# Patient Record
Sex: Male | Born: 1950 | Race: White | Hispanic: No | Marital: Married | Smoking: Former smoker
Health system: Southern US, Community
[De-identification: ages and names within clinical notes are randomized; demographics above are authoritative.]

## PROBLEM LIST (undated history)

## (undated) DIAGNOSIS — E7800 Pure hypercholesterolemia, unspecified: Secondary | ICD-10-CM

## (undated) DIAGNOSIS — J349 Unspecified disorder of nose and nasal sinuses: Secondary | ICD-10-CM

## (undated) DIAGNOSIS — I10 Essential (primary) hypertension: Secondary | ICD-10-CM

## (undated) DIAGNOSIS — L570 Actinic keratosis: Secondary | ICD-10-CM

## (undated) HISTORY — PX: SKIN CANCER EXCISION: SHX779

## (undated) HISTORY — DX: Essential (primary) hypertension: I10

## (undated) HISTORY — DX: Unspecified disorder of nose and nasal sinuses: J34.9

## (undated) HISTORY — PX: KNEE ARTHROSCOPY: SUR90

## (undated) HISTORY — DX: Pure hypercholesterolemia, unspecified: E78.00

## (undated) HISTORY — PX: OTHER SURGICAL HISTORY: SHX169

## (undated) HISTORY — DX: Actinic keratosis: L57.0

## (undated) HISTORY — PX: JOINT REPLACEMENT: SHX530

## (undated) HISTORY — PX: CHOLECYSTECTOMY: SHX55

## (undated) HISTORY — PX: BACK SURGERY: SHX140

---

## 1998-06-26 HISTORY — PX: LUMBAR FUSION: SHX111

## 2005-06-26 HISTORY — PX: OTHER SURGICAL HISTORY: SHX169

## 2005-06-26 HISTORY — PX: REPLACEMENT TOTAL KNEE: SUR1224

## 2006-01-17 ENCOUNTER — Other Ambulatory Visit: Payer: Self-pay

## 2006-01-17 IMAGING — CR DG CHEST 2V
1 series · 2 of 2 positions shown · non-contrast
Comparison: none

REASON FOR EXAM: Hypertension
COMMENTS:

PROCEDURE:     DXR - DXR CHEST PA (OR AP) AND LATERAL  - [DATE] [DATE]
RESULT:     The lung fields are clear. The heart, mediastinal and osseous
structures are normal in appearance.

[Series 1: view not recorded · 0.17mm/px · 2 of 2 slices shown]
[im 1/2]
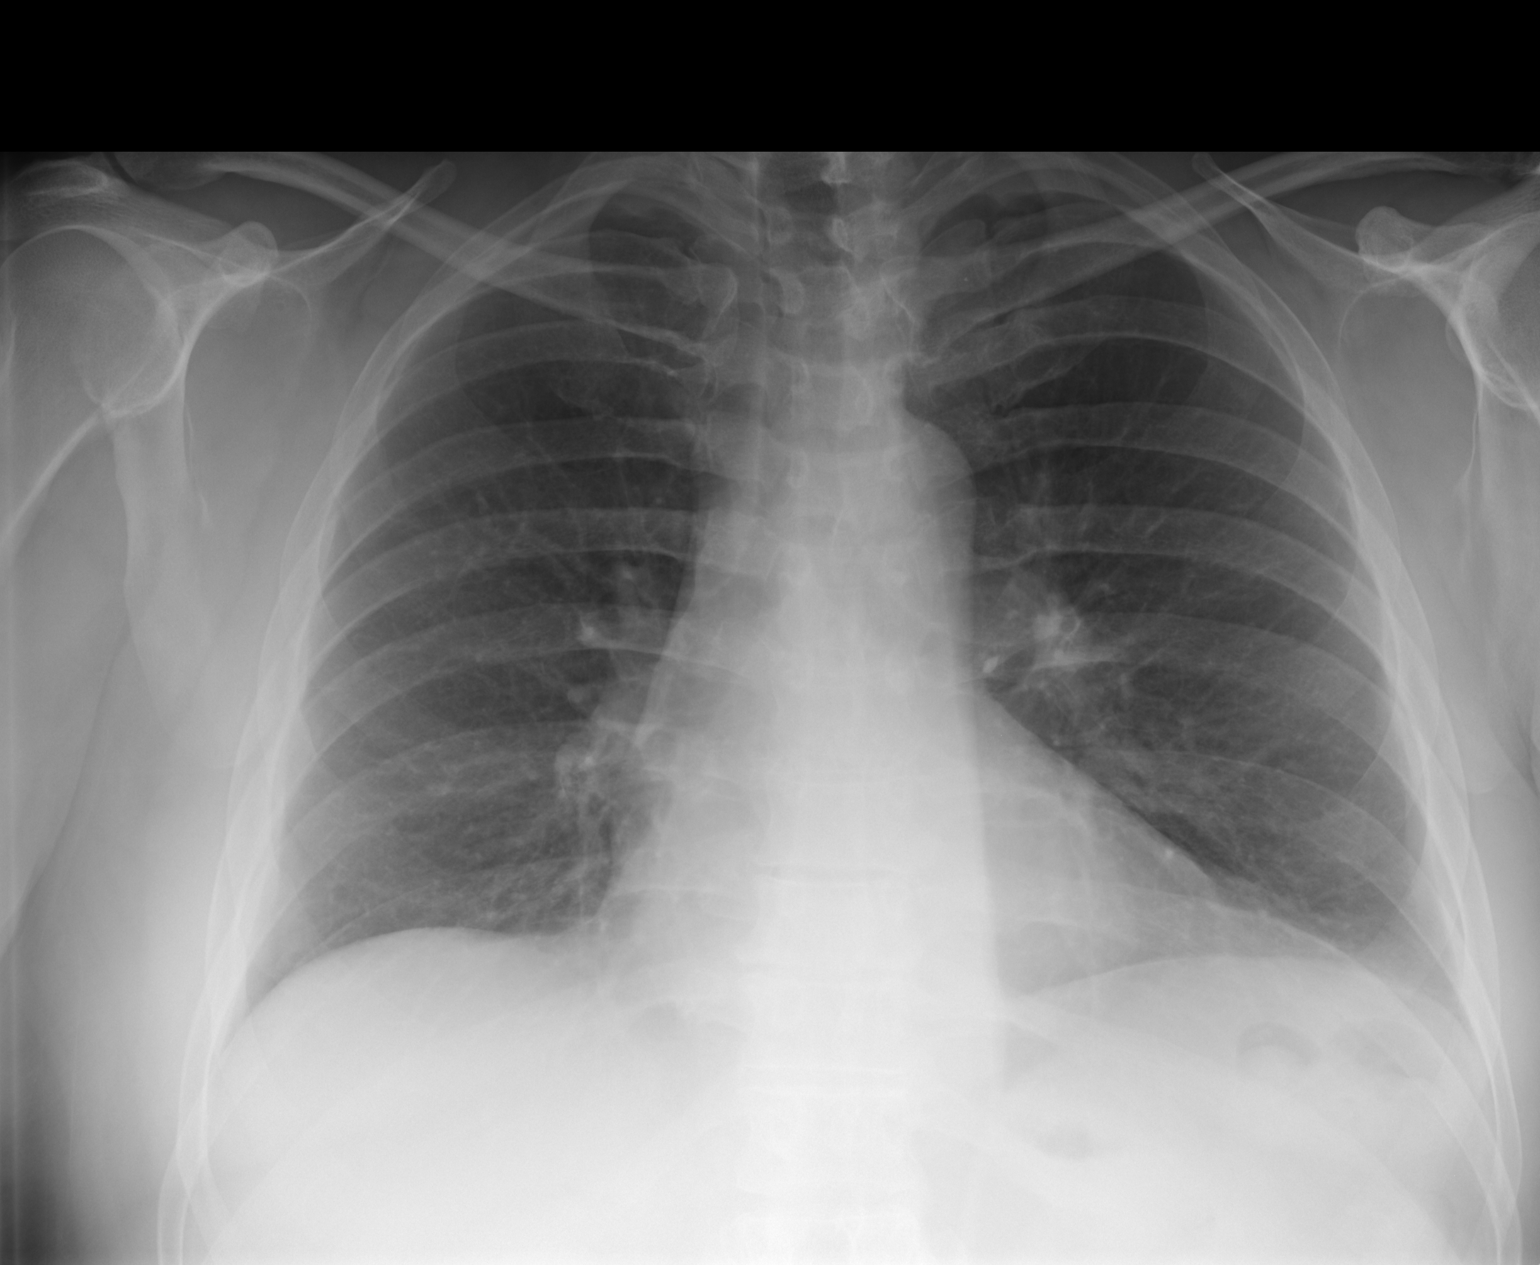
[im 2/2]
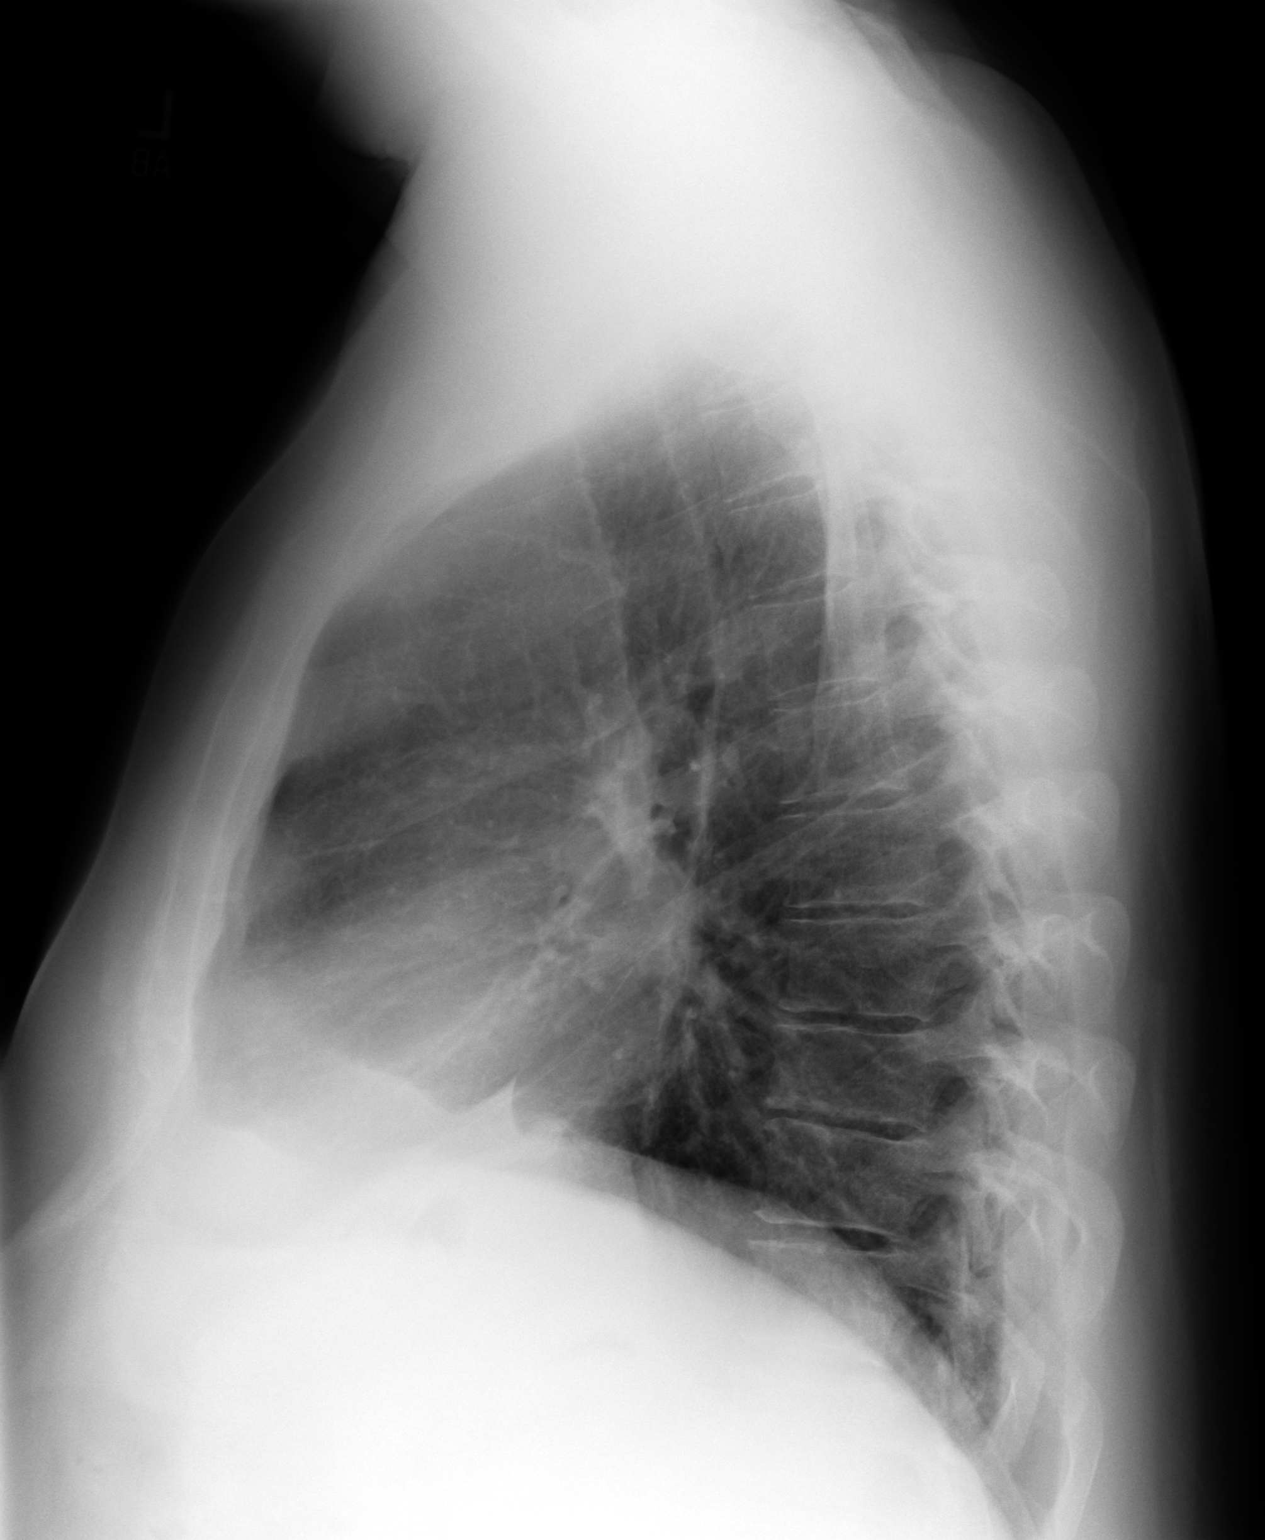

[2 of 2 positions shown; findings below may reference images not displayed]

IMPRESSION: No acute changes are identified.

## 2006-01-19 ENCOUNTER — Ambulatory Visit: Payer: Self-pay | Admitting: Unknown Physician Specialty

## 2006-04-04 ENCOUNTER — Ambulatory Visit: Payer: Self-pay | Admitting: Unknown Physician Specialty

## 2006-09-25 ENCOUNTER — Other Ambulatory Visit: Payer: Self-pay

## 2006-09-25 ENCOUNTER — Ambulatory Visit: Payer: Self-pay | Admitting: Unknown Physician Specialty

## 2006-09-25 IMAGING — CR DG CHEST 2V
1 series · 2 of 2 positions shown · non-contrast
Comparison: none

REASON FOR EXAM: htn
COMMENTS:

[Series 1: view not recorded · 0.17mm/px · 2 of 2 slices shown]
[im 1/2]
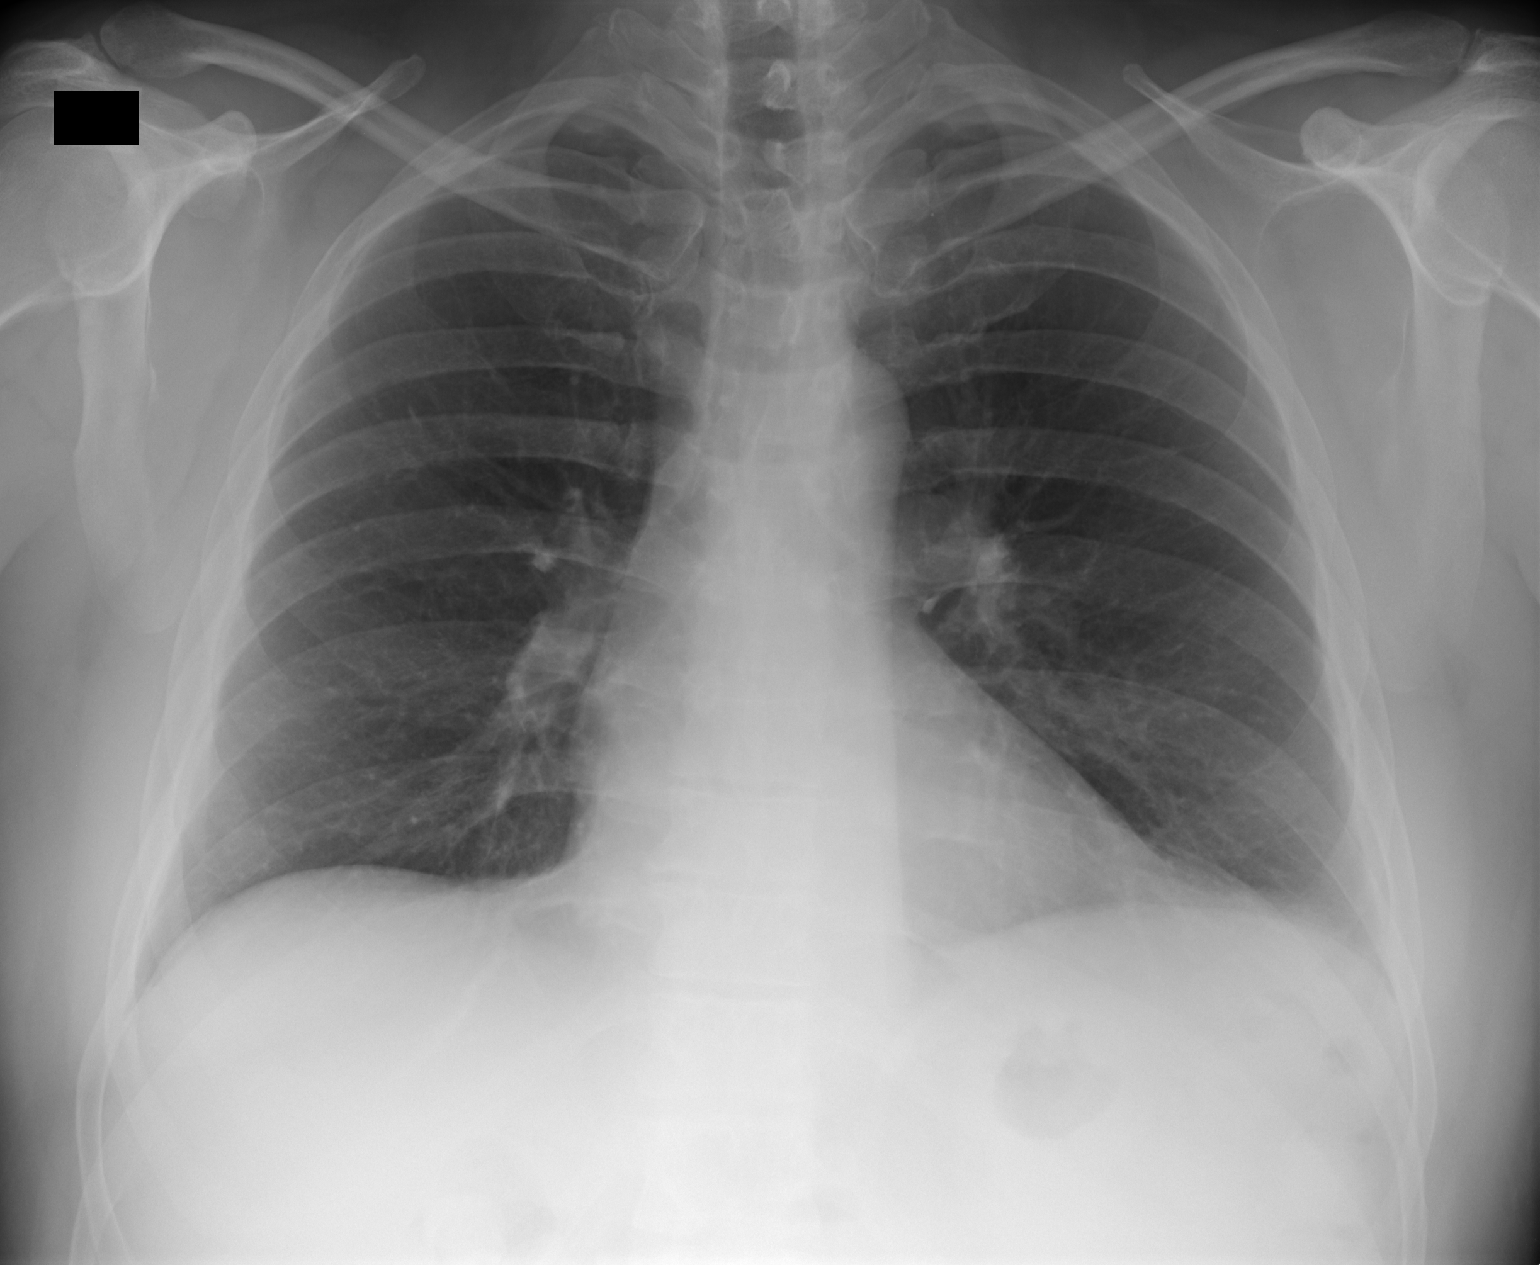
[im 2/2]
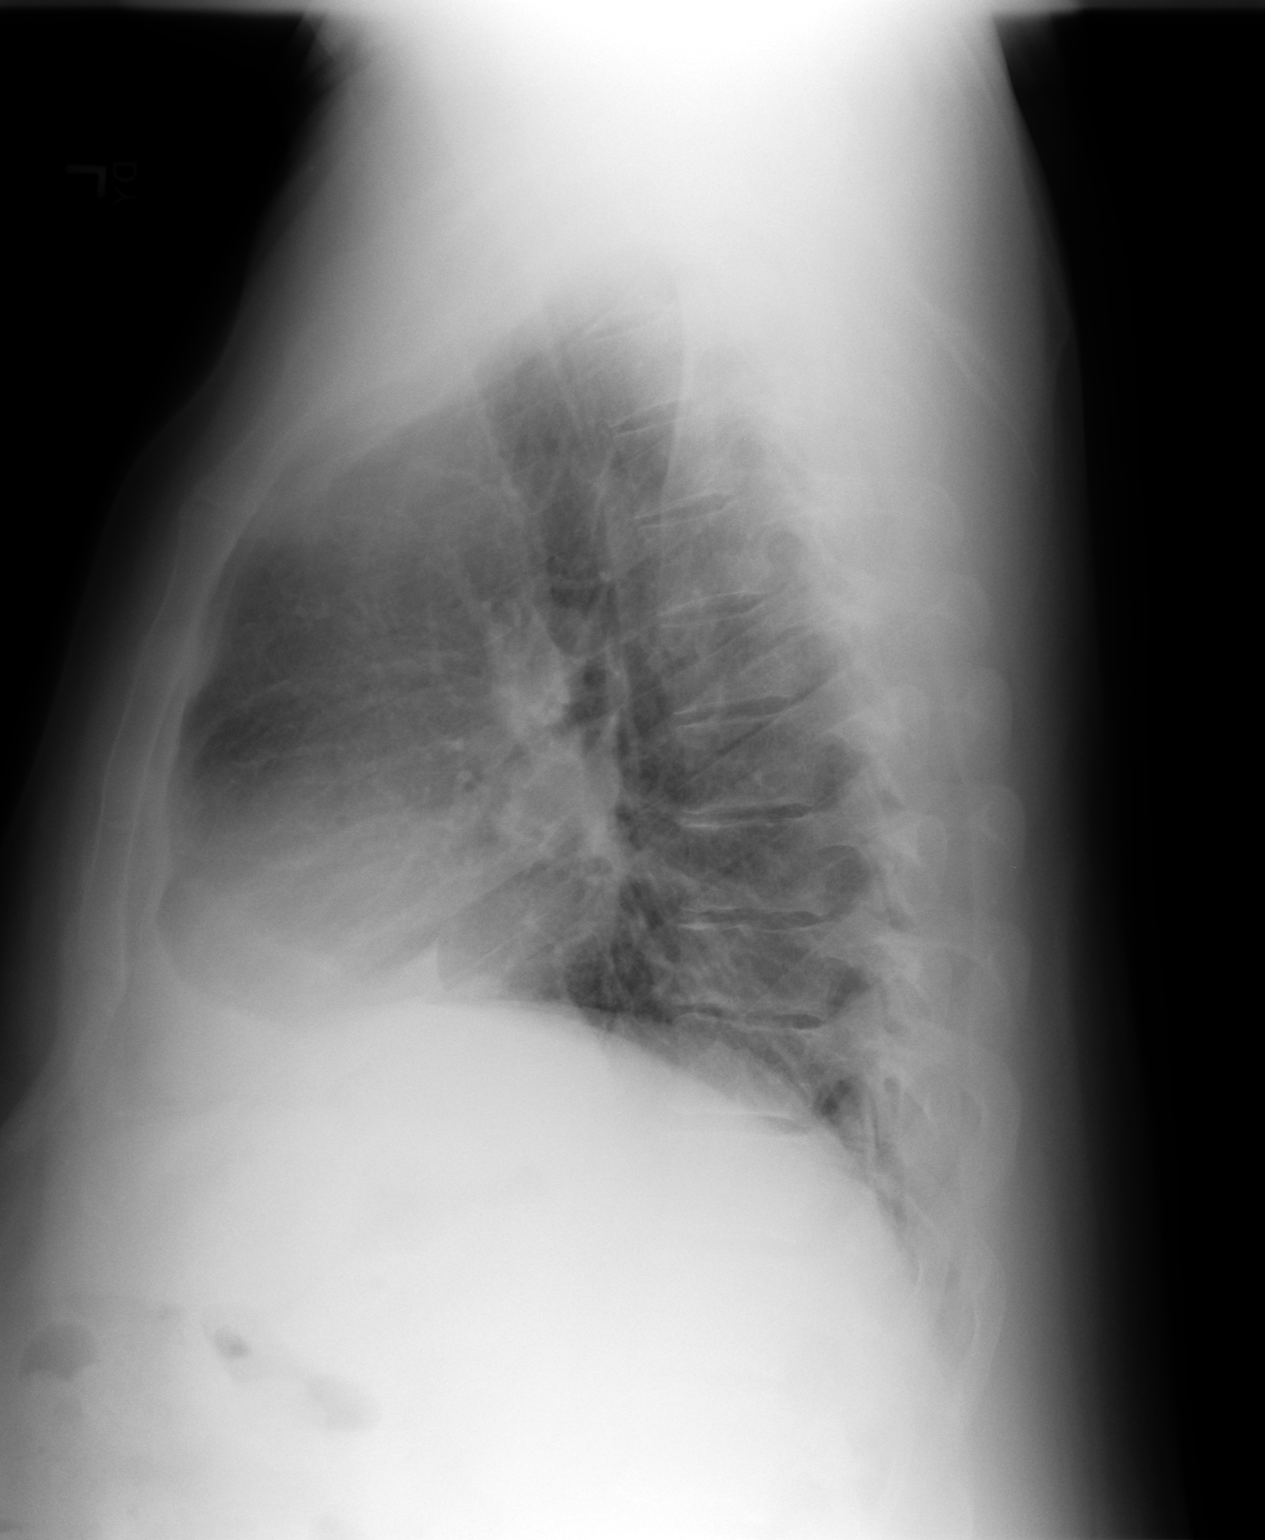

[2 of 2 positions shown; findings below may reference images not displayed]

PROCEDURE:     DXR - DXR CHEST PA (OR AP) AND LATERAL  - [DATE]  [DATE]

RESULT:     Comparison is made to a [DATE].

The lungs are adequately inflated. Slightly increased density is noted at
the LEFT lung base but no discrete infiltrate is seen. The heart and
pulmonary vascularity are within the limits of normal.
IMPRESSION: I do not see objective evidence of acute cardiopulmonary abnormality.

## 2006-10-02 ENCOUNTER — Inpatient Hospital Stay: Payer: Self-pay | Admitting: Unknown Physician Specialty

## 2006-10-02 IMAGING — CR DG KNEE 1-2V*R*
1 series · 3 of 3 positions shown · non-contrast
Comparison: none

REASON FOR EXAM: post op TKR
COMMENTS:   Bedside (portable):Y

[Series 1: view not recorded · 0.17mm/px · 3 of 3 slices shown]
[im 1/3]
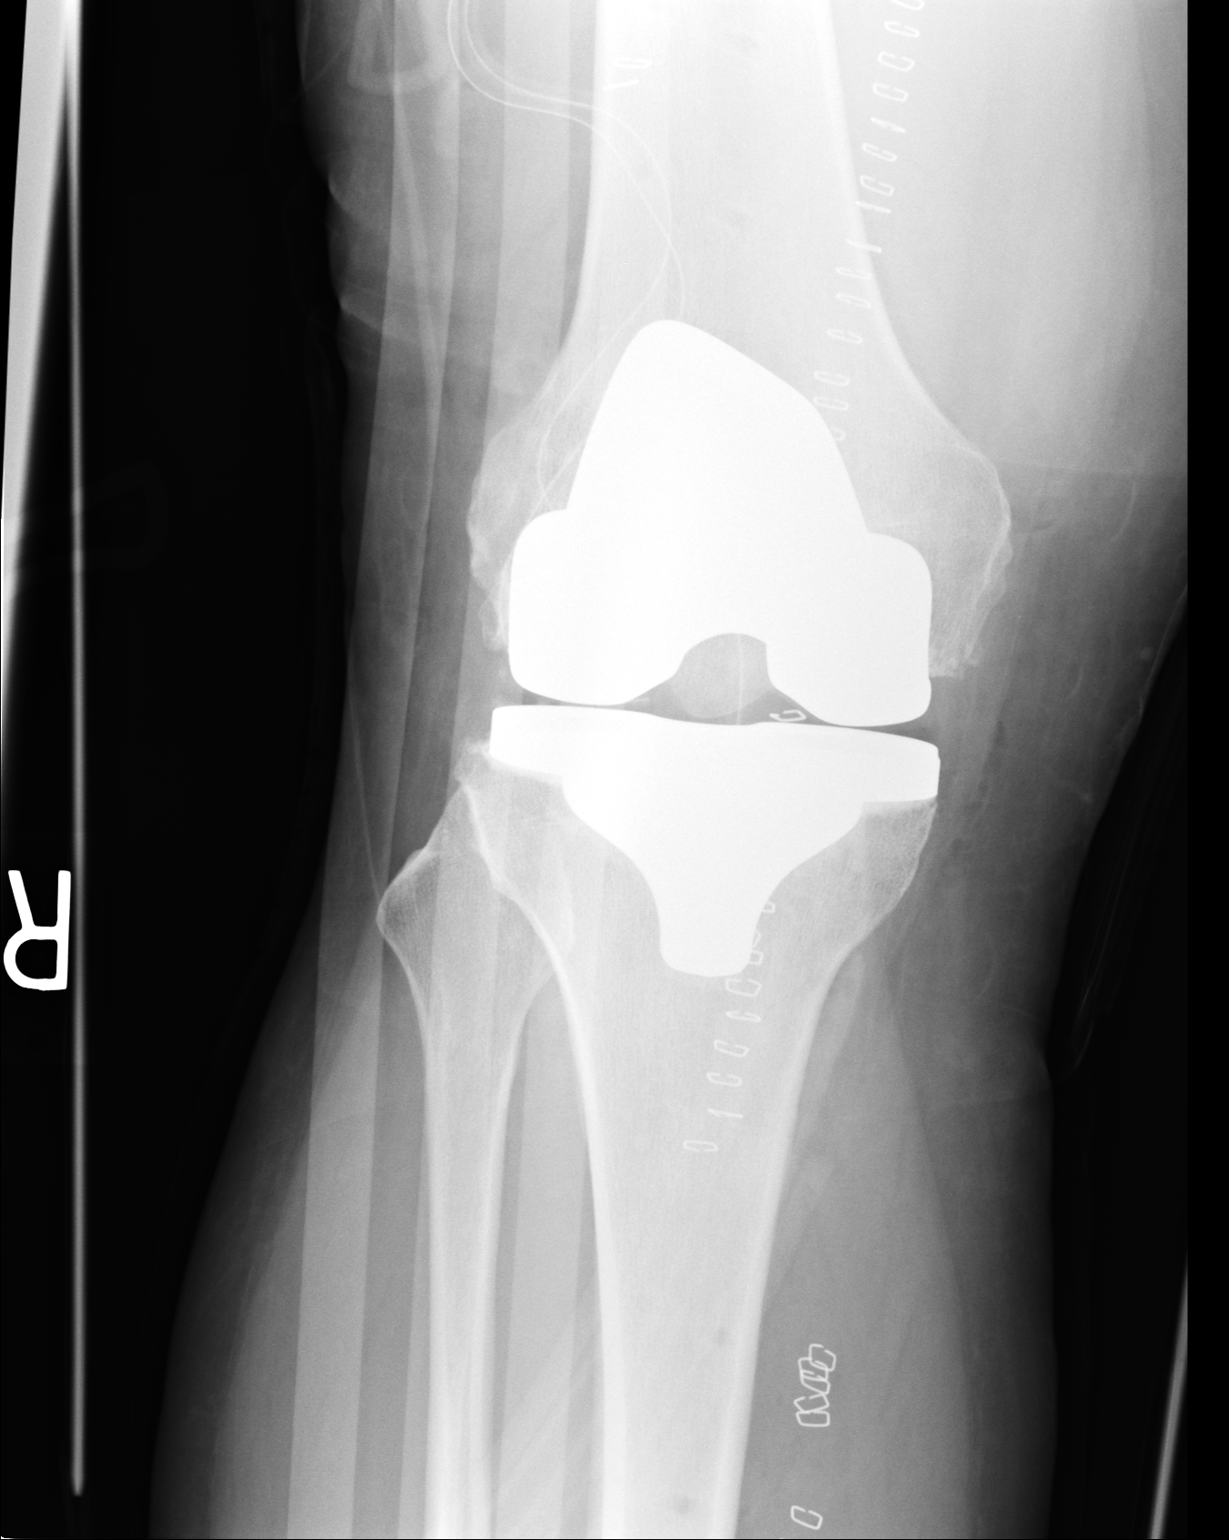
[im 2/3]
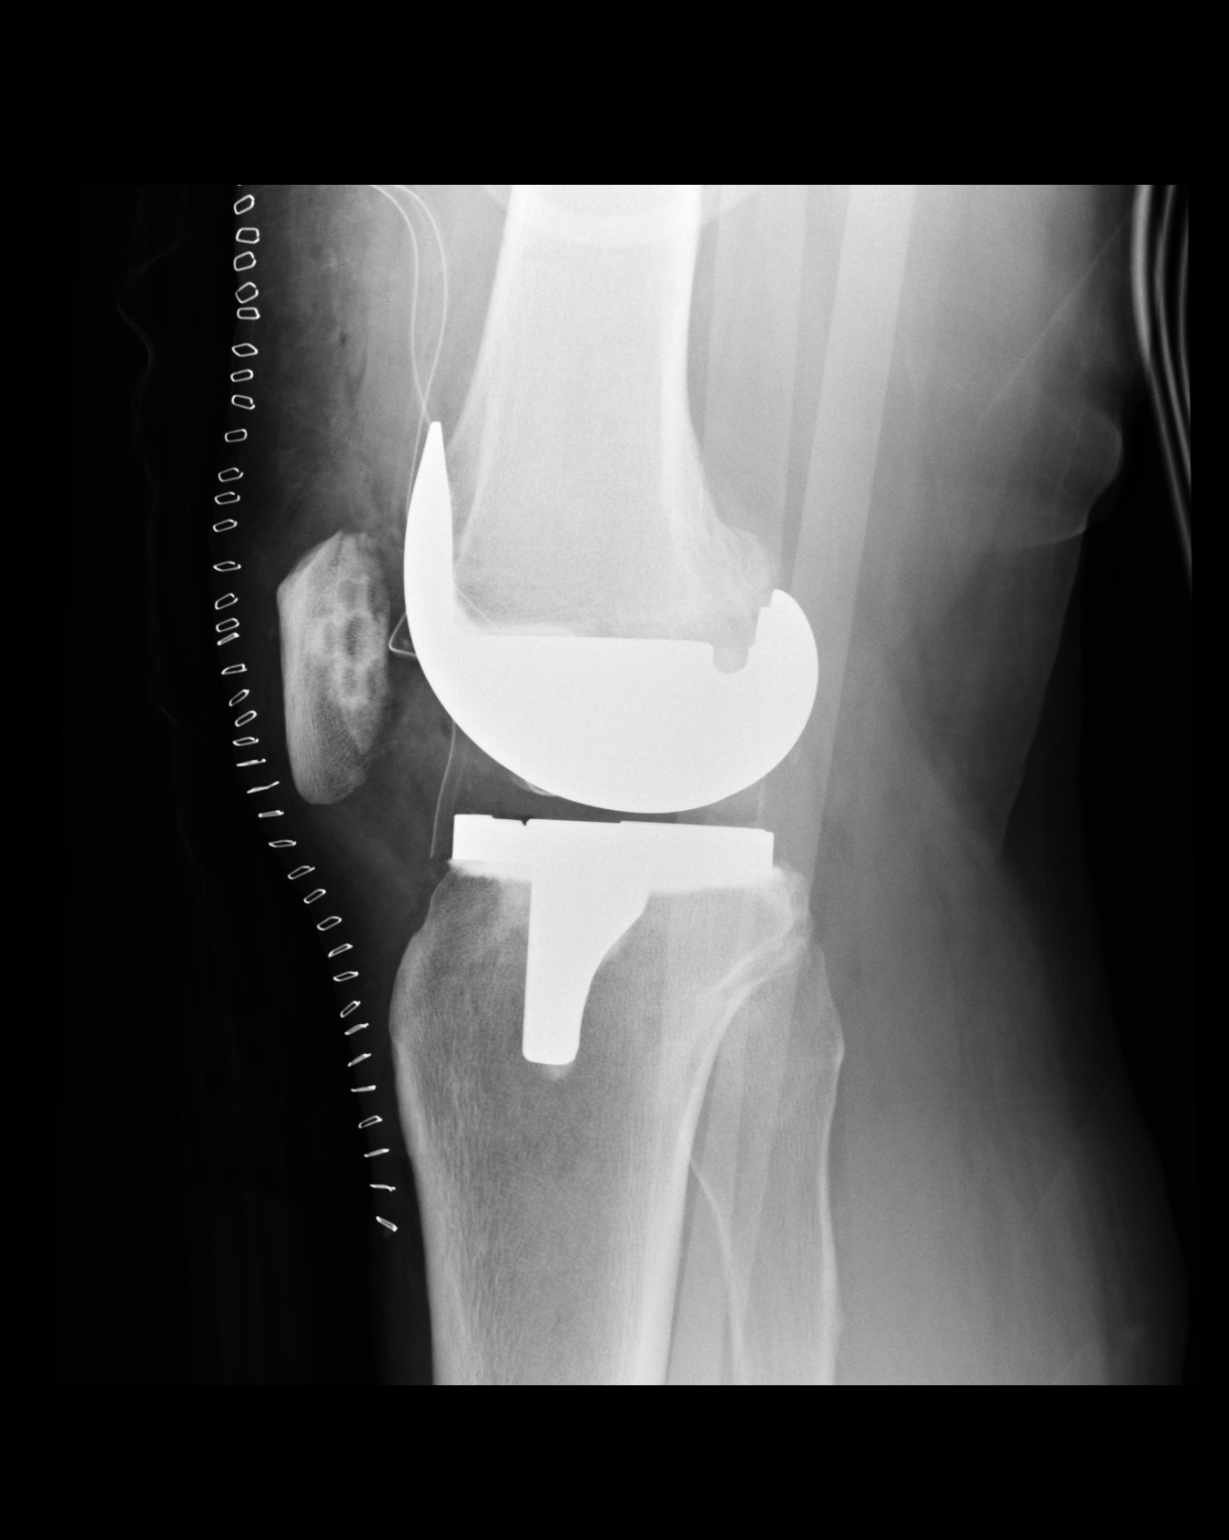
[im 3/3]
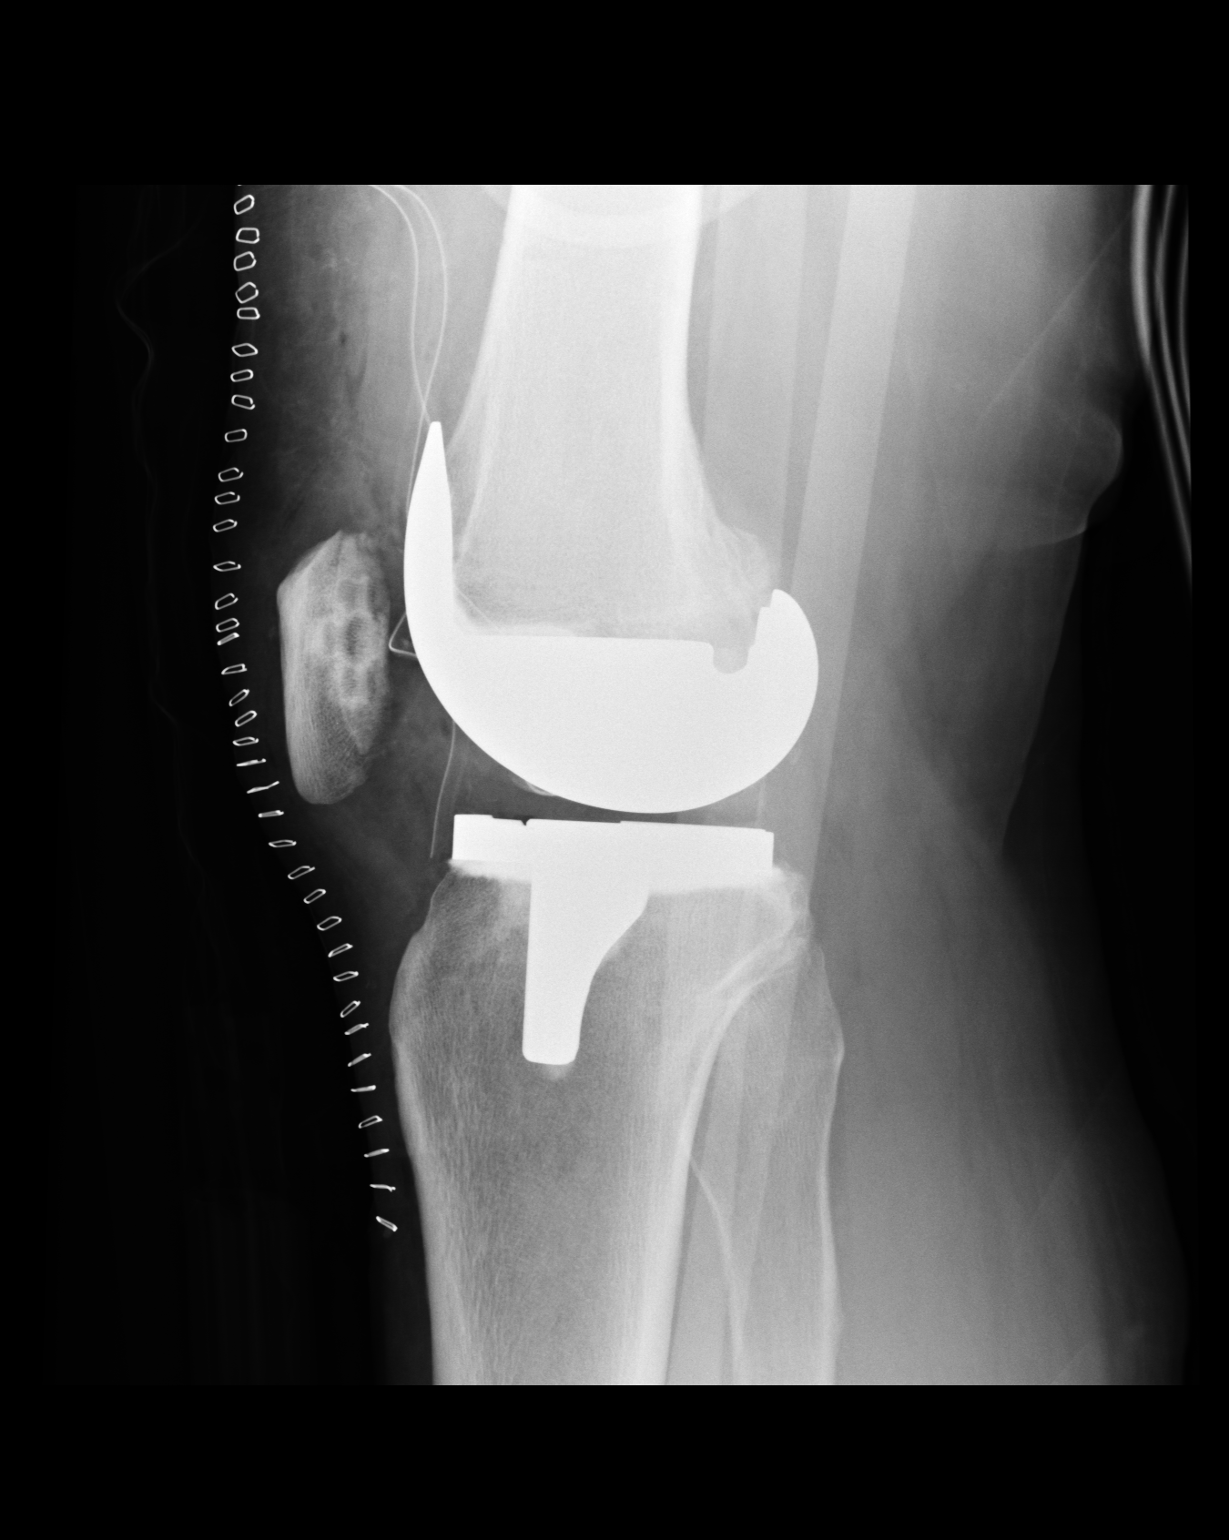

[3 of 3 positions shown; findings below may reference images not displayed]

PROCEDURE:     DXR - DXR KNEE RIGHT AP AND LATERAL  - [DATE] [DATE]

RESULT:     The patient is status post total RIGHT knee replacement.  There
is a possible cortical fracture along the distal femur anteriorly superior
to the anterior margin of the femoral prosthesis component. Note is made
this is not a definite finding but a possible finding. No other areas
suspicious for fracture about the prosthetic components are seen. There is
no dislocation of the prosthetic knee joint. Surgical drains are noted
anteriorly.
IMPRESSION: 1.     Please see above.

## 2006-10-08 ENCOUNTER — Ambulatory Visit: Payer: Self-pay | Admitting: Pain Medicine

## 2007-04-19 ENCOUNTER — Ambulatory Visit: Payer: Self-pay | Admitting: Gastroenterology

## 2009-06-30 ENCOUNTER — Ambulatory Visit: Payer: Self-pay | Admitting: General Surgery

## 2009-06-30 IMAGING — CT CT ABD-PELV W/ CM
1 of 3 series · 14 of 32 positions shown, 19 images · IV contrast (isovue)
Comparison: None

REASON FOR EXAM: (1) ruq pain; (2) ruq pain
COMMENTS:   May transport without cardiac monitor

PROCEDURE:     CT  - CT ABDOMEN / PELVIS  W  - [DATE] [DATE]
RESULT:     History: Right upper quadrant pain
TECHNIQUE: Multiple axial images of the abdomen and pelvis were performed
from the lung bases to the pubic symphysis, with p.o. contrast and with 100
ml of Isovue 370 intravenous contrast.

[Series 2: abdomen · axial · 0.86mm/px · z∈[-60,+390]mm · 14 of 102 slices shown, 19 images]
[im 6/102  soft-tissue]
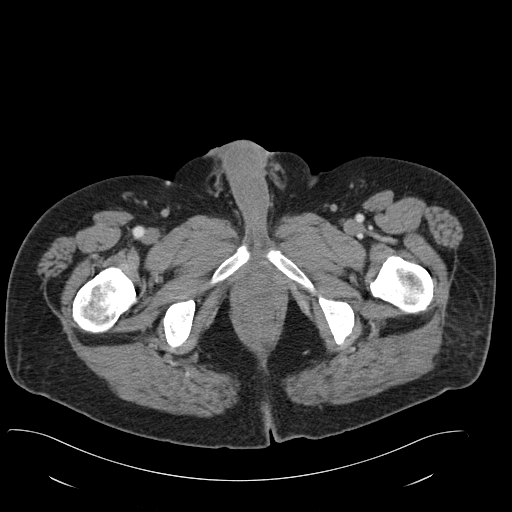
[im 6/102  bone]
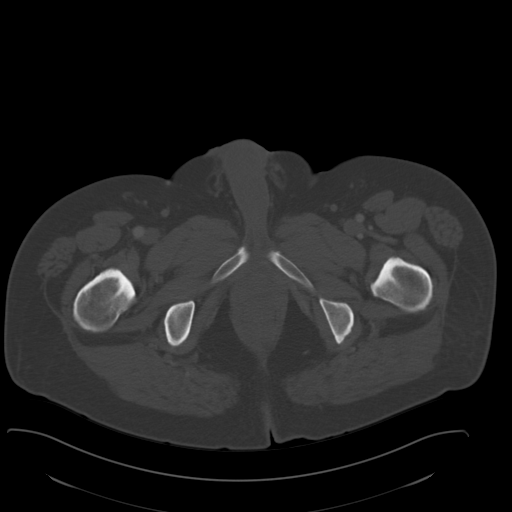
[im 12/102  soft-tissue]
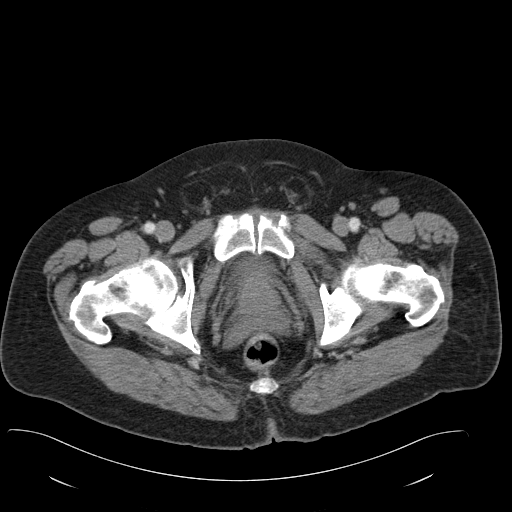
[im 24/102  soft-tissue]
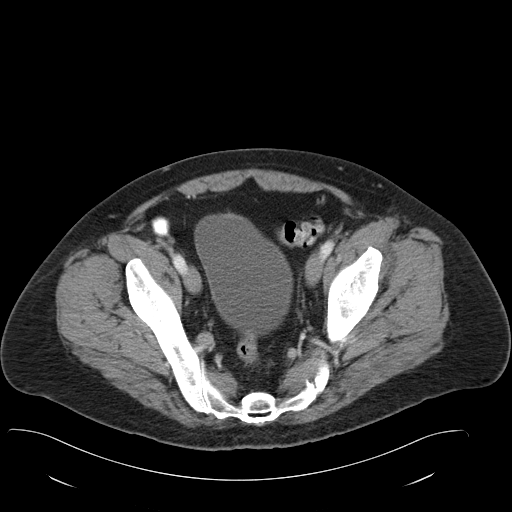
[im 30/102  soft-tissue]
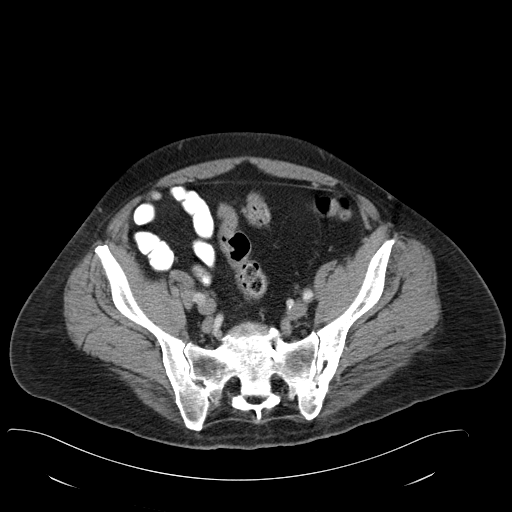
[im 36/102  soft-tissue]
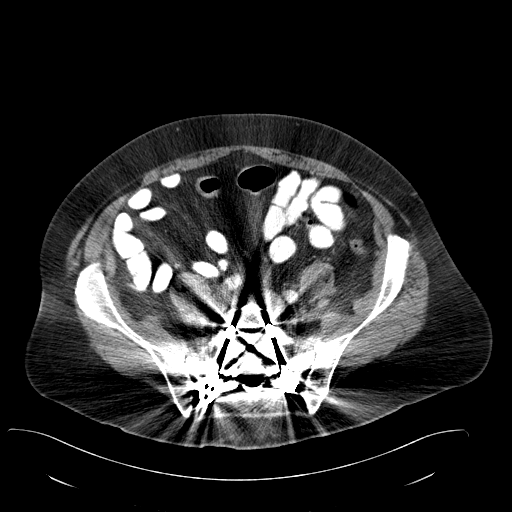
[im 42/102  soft-tissue]
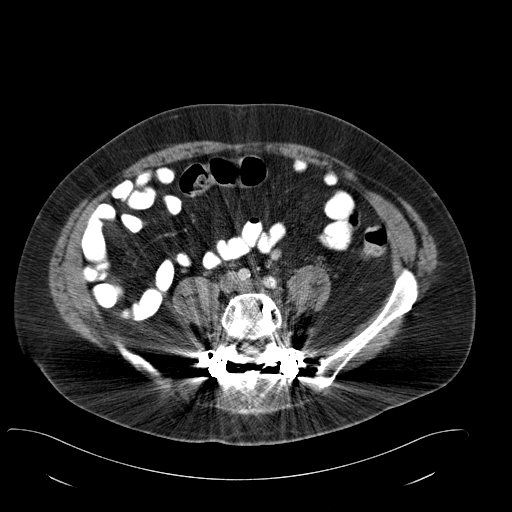
[im 54/102  soft-tissue]
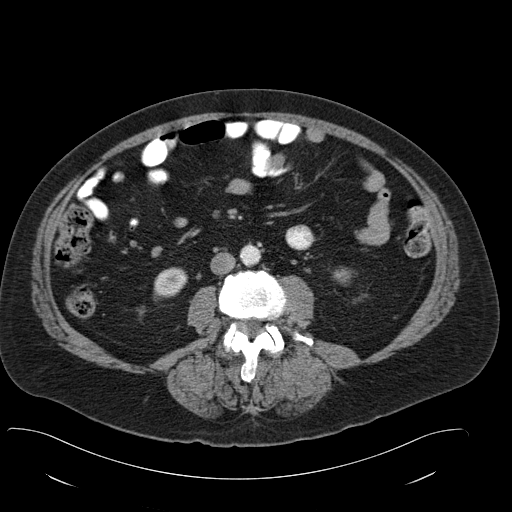
[im 60/102  soft-tissue]
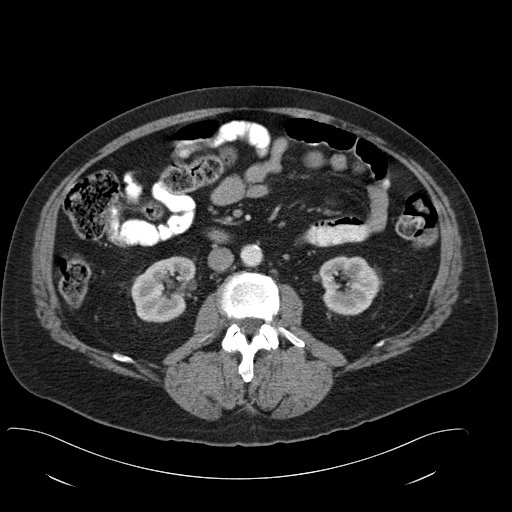
[im 66/102  soft-tissue]
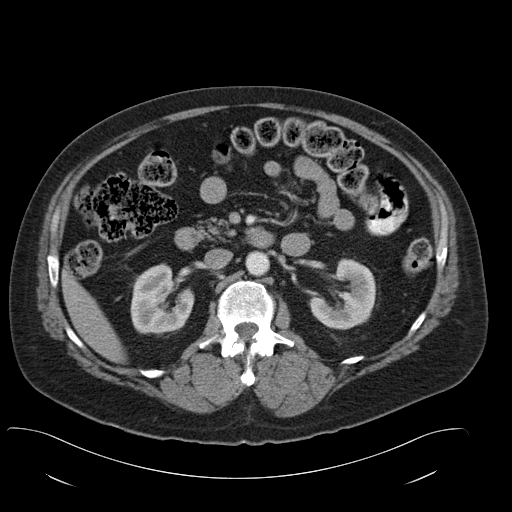
[im 66/102  bone]
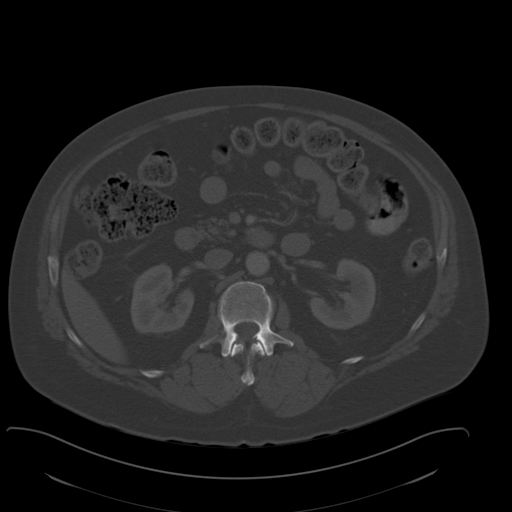
[im 72/102  soft-tissue]
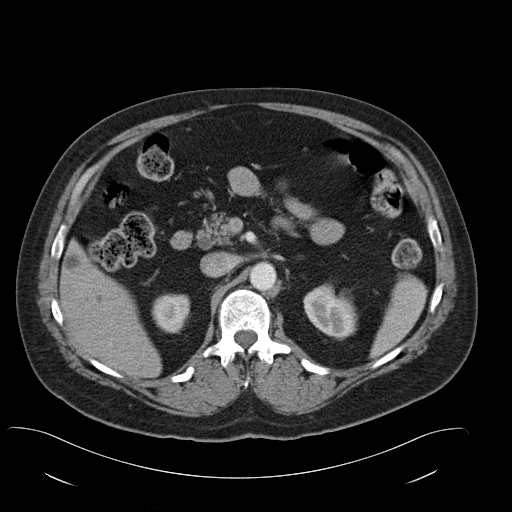
[im 78/102  soft-tissue]
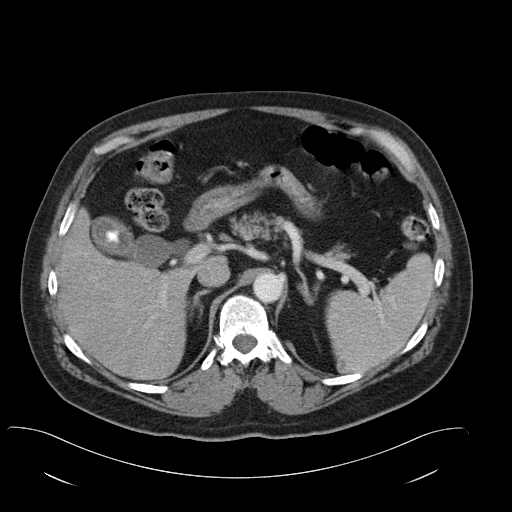
[im 78/102  lung]
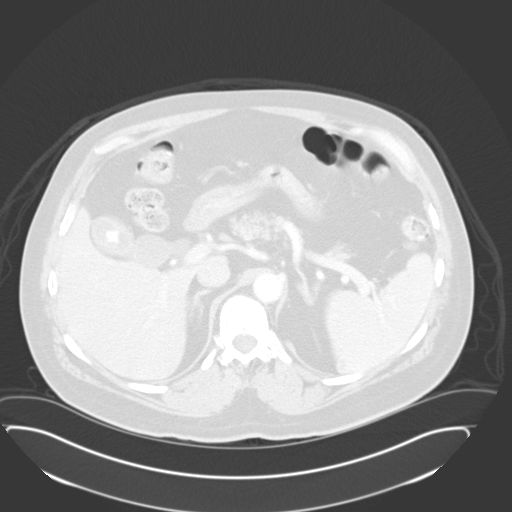
[im 84/102  lung]
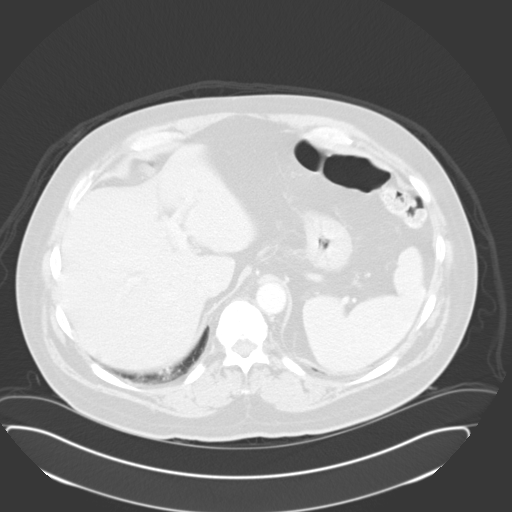
[im 90/102  soft-tissue]
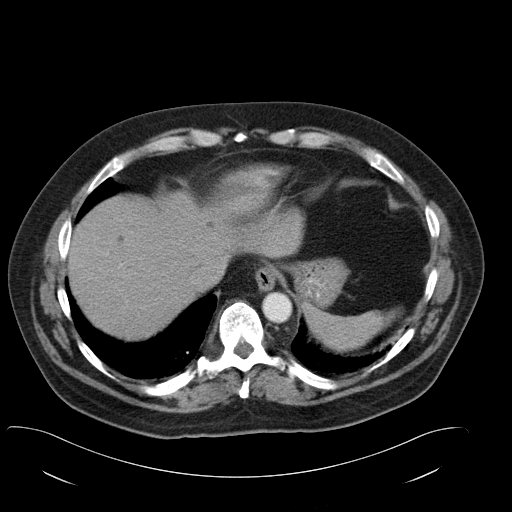
[im 90/102  lung]
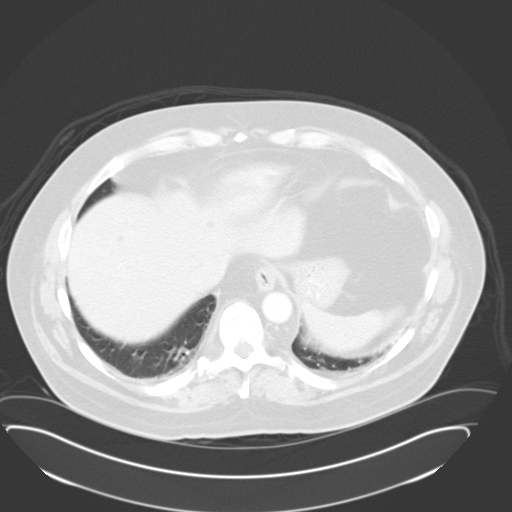
[im 96/102  soft-tissue]
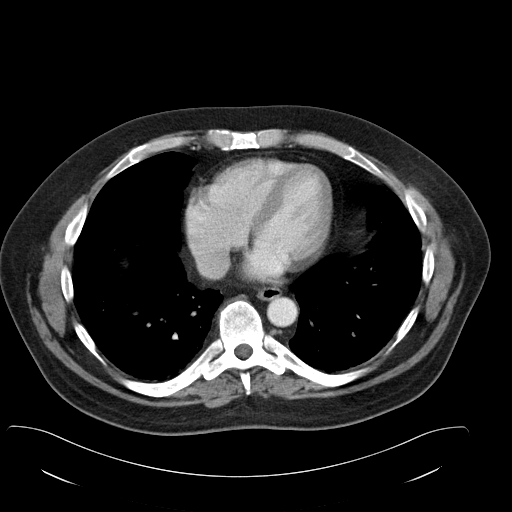
[im 96/102  lung]
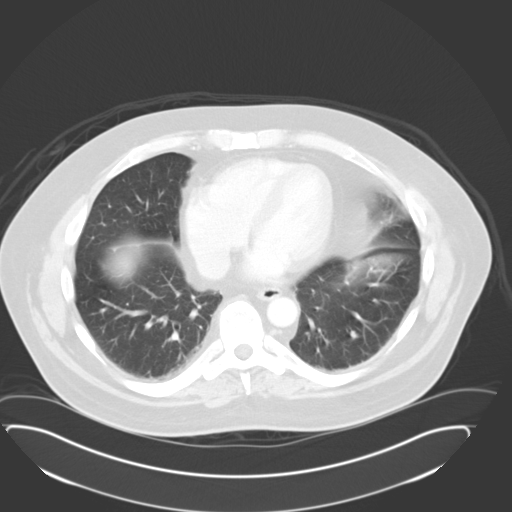

[14 of 32 positions shown; findings below may reference images not displayed]

FINDINGS: The lung bases are clear. There is no pneumothorax. The heart size is
normal.

The liver demonstrates no focal abnormality. There is mild intrahepatic
biliary ductal dilatation. There are multiple cystic areas within the left
hepatic lobe which may represent choledochal cyst versus hamartomas versus
cysts. There are cholelithiasis. The spleen demonstrates no focal
abnormality. The kidneys, adrenal glands, and pancreas are normal. The
bladder is unremarkable.

The stomach, duodenum, small intestine, and large intestine demonstrate no
contrast extravasation or dilatation. There is a normal caliber appendix in
the right lower quadrant without periappendiceal inflammatory changes. There
is no pneumoperitoneum, pneumatosis, or portal venous gas. There is no
abdominal or pelvic free fluid. There is no lymphadenopathy.

The abdominal aorta is normal in caliber with atherosclerosis.

There is posterior spinal fusion from L4 through S1.
IMPRESSION: Cholelithiasis with mild gallbladder wall thickening. Correlate with prior
ultrasound the appearance can be seen with cholecystitis.

Mild intrahepatic biliary ductal dilatation. There are also cystic areas in
the left hepatic lobe which may resent hamartomas, cysts versus choledochal
cyst.

## 2009-06-30 IMAGING — US ABDOMEN ULTRASOUND
1 series · 17 of 25 positions shown · non-contrast
Comparison: none

REASON FOR EXAM: Need Gallbadder US for RUQ pain
COMMENTS:

[Series 1: abdomen ultrasound · 17 of 74 slices shown]
[im 1/74]
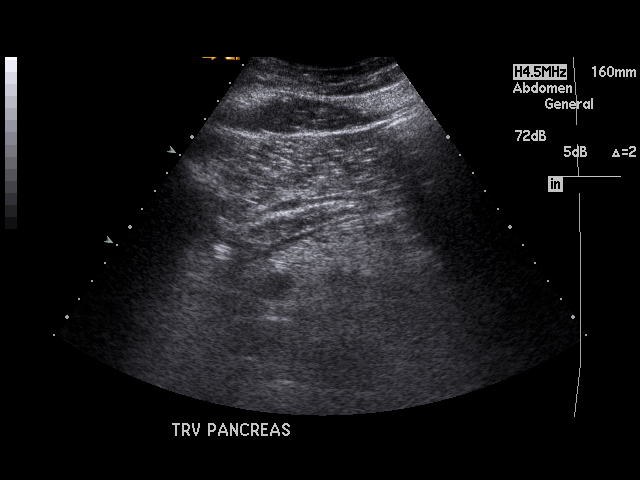
[im 7/74]
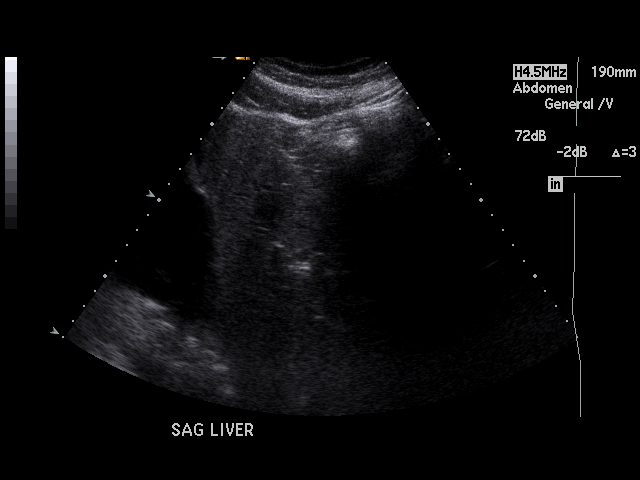
[im 10/74]
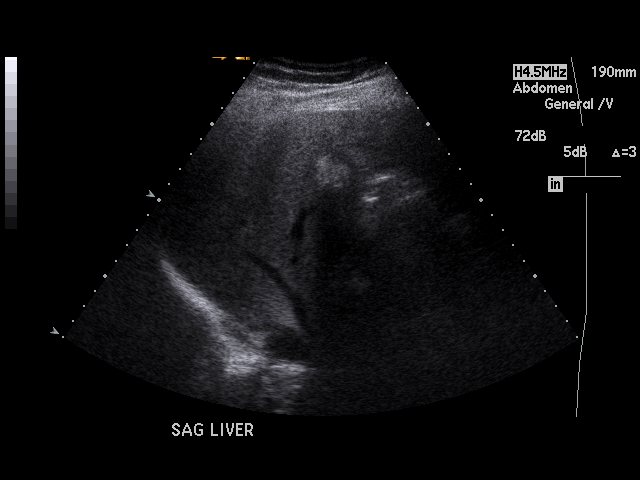
[im 16/74]
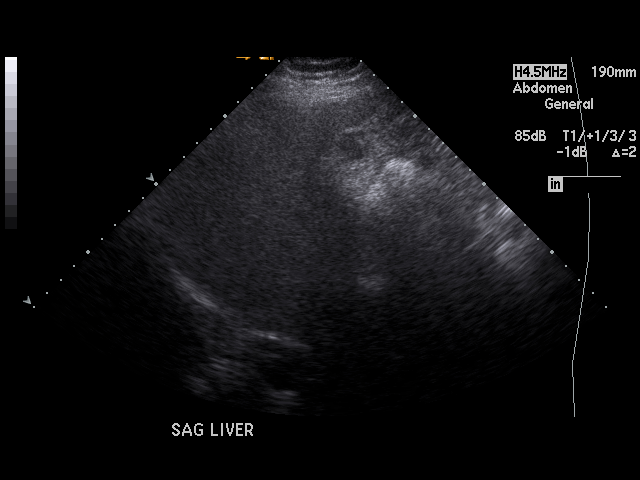
[im 19/74]
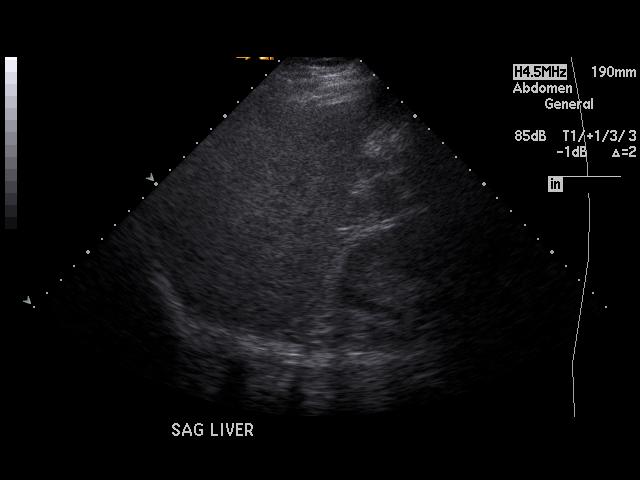
[im 25/74]
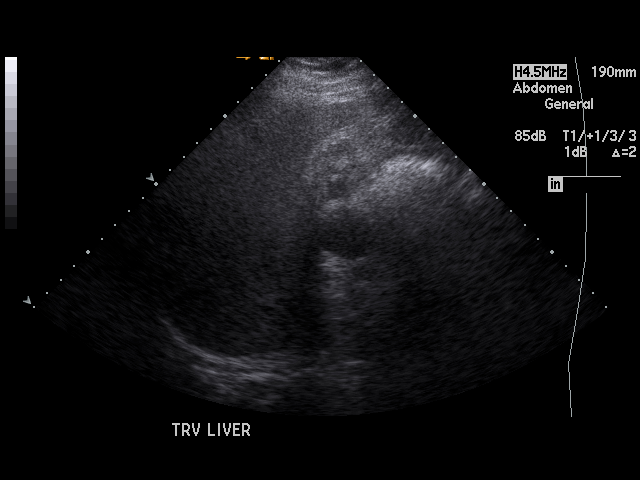
[im 28/74]
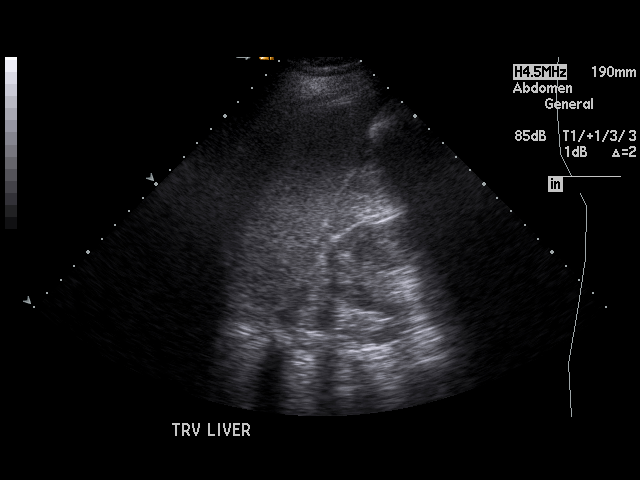
[im 34/74]
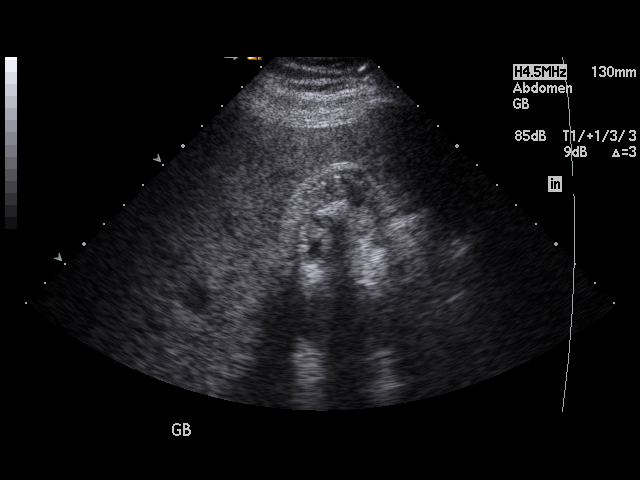
[im 37/74]
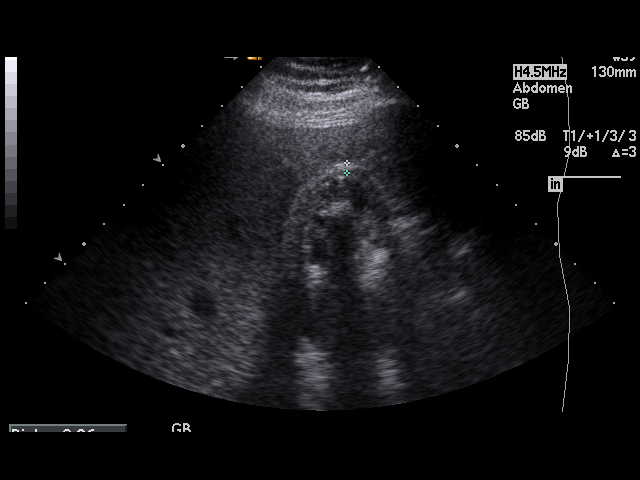
[im 40/74]
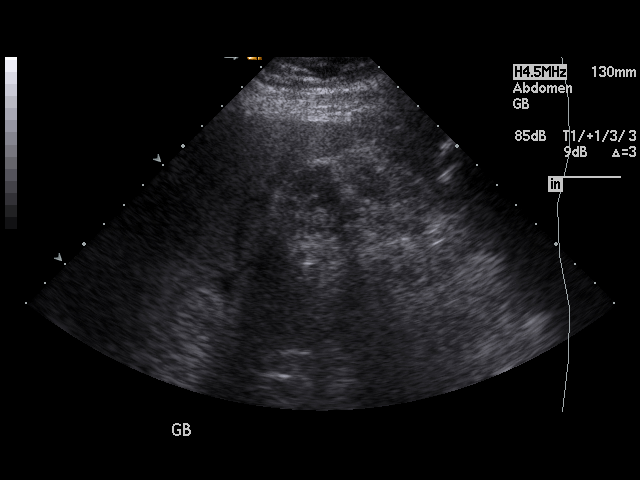
[im 46/74]
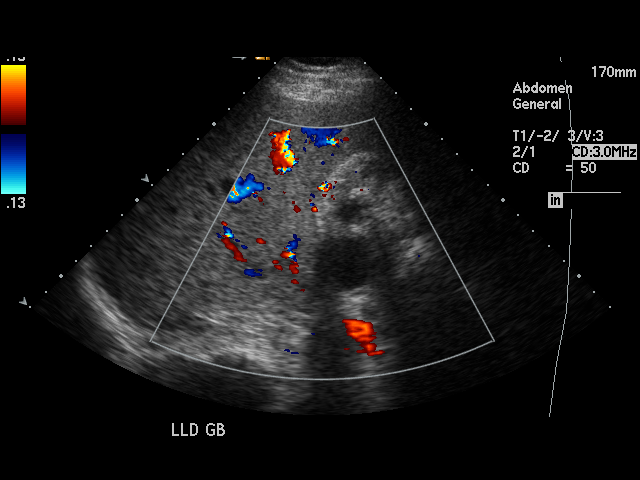
[im 49/74]
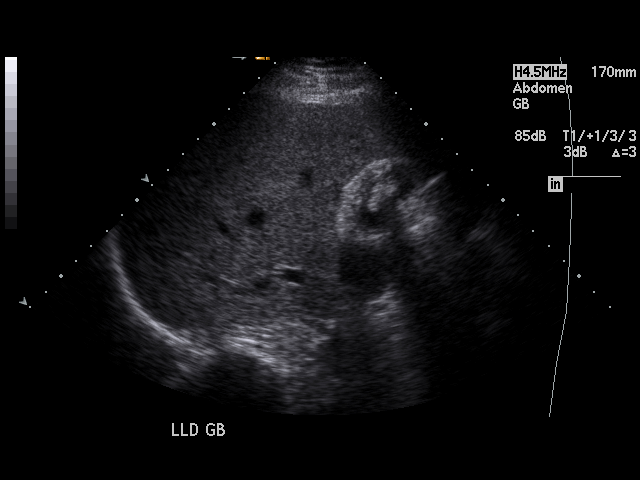
[im 55/74]
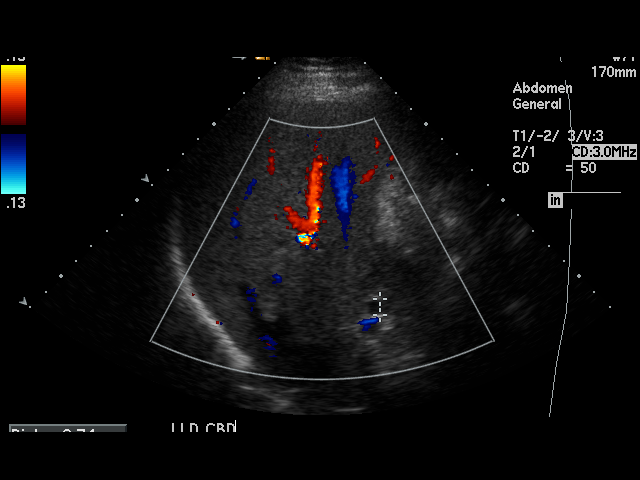
[im 58/74]
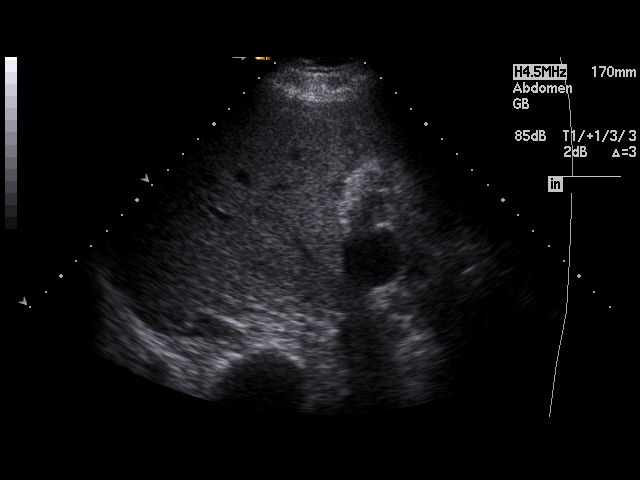
[im 64/74]
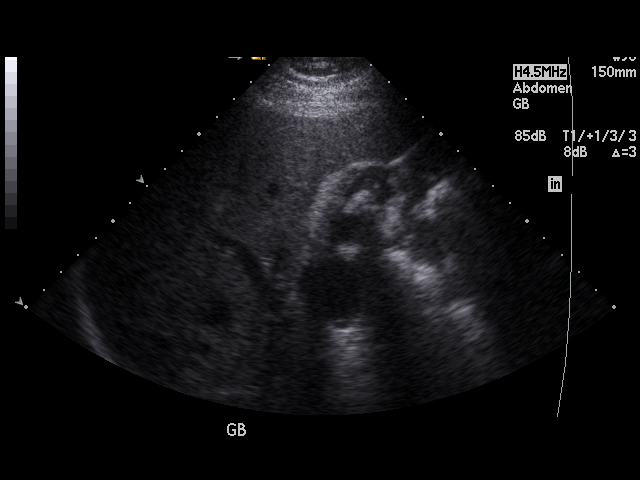
[im 67/74]
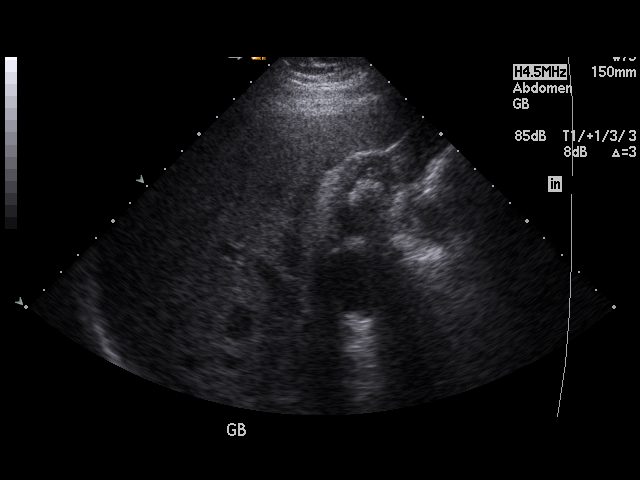
[im 74/74]
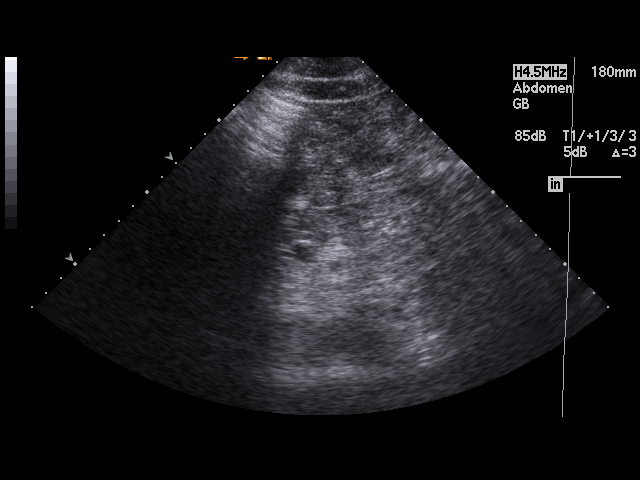

[17 of 25 positions shown; findings below may reference images not displayed]

PROCEDURE:     US  - US ABDOMEN GENERAL SURVEY  - [DATE]  [DATE]

RESULT:     There is a 2.22 cm cyst in the anterior aspect of the right lobe
of the liver. The pancreas is not visualized adequately for evaluation on
this exam. The spleen is mildly enlarged and is noted to measure 13.85 cm at
maximum AP diameter. The abdominal aorta and inferior vena cava show no
significant abnormalities. There are noted multiple echodensities in the
gallbladder compatible with gallstones. There is thickening of the
gallbladder wall which measures 6.2 mm in thickness. No definite
pericholecystic fluid is seen. There is noted a lucent area posterior to the
gallbladder. On the basis of this exam it is uncertain as to whether this
represents a septated segment of the gallbladder, a gallbladder cyst or a
hepatic cyst adjacent to the gallbladder. This could be further evaluated by
CT if such is clinically indicated. The kidneys show no hydronephrosis.
There is no ascites. The common bile duct measures 7 mm in diameter which is
upper limits for normal. No dilated intrahepatic ducts are seen.
IMPRESSION: 1. Cholelithiasis with associated thickening of the gallbladder wall.
2. There is a 3.03 cystic-appearing area associated with the posterior
aspect of the gallbladder. As noted above it is unclear as to whether this
is secondary to a segmented gallbladder, gallbladder cyst or an adjacent
cyst such as a hepatic cyst. This could be further elucidated by CT if such
is clinically indicated.
3. The common bile duct measures 7 mm in diameter which is upper limits for
normal.
4. The spleen is mildly enlarged.
5. The pancreas is not visualized adequately for evaluation on this exam.

## 2009-12-06 ENCOUNTER — Ambulatory Visit: Payer: Self-pay | Admitting: Unknown Physician Specialty

## 2009-12-06 IMAGING — CT CT LUMBAR SPINE WITHOUT CONTRAST
1 of 6 series · 2 of 14 positions shown, 3 images · non-contrast
Comparison: none

REASON FOR EXAM: BACK PAIN PRIOR LUMBAR SURGERY
COMMENTS:

[Series 607: axial range 6 · axial · 0.37mm/px · z∈[-572,-547]mm · 2 of 91 slices shown, 3 images]
[im 31/91  soft-tissue]
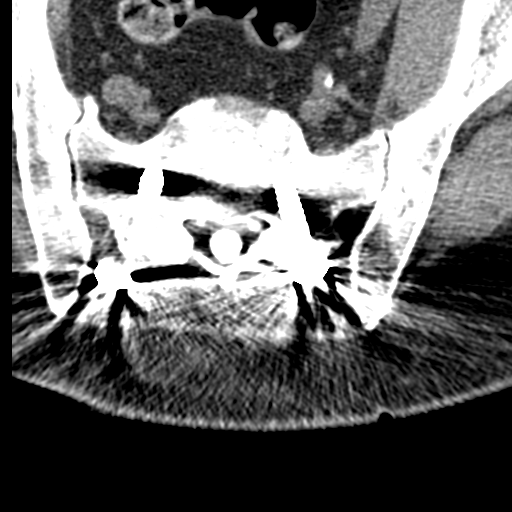
[im 31/91  bone]
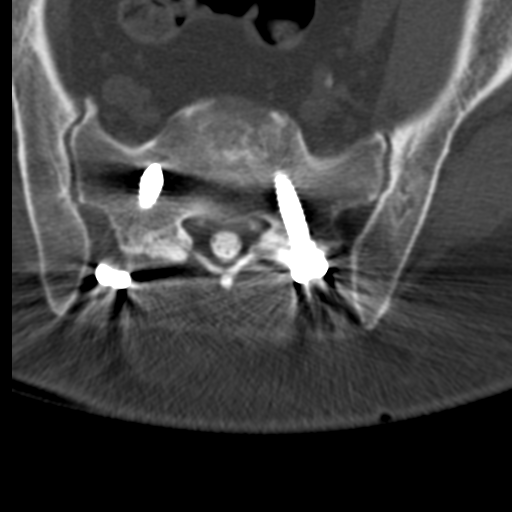
[im 61/91  bone]
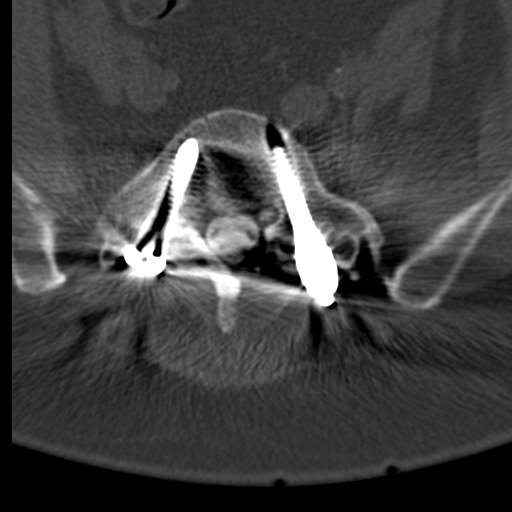

[2 of 14 positions shown; findings below may reference images not displayed]

PROCEDURE:     CT  - CT LUMBAR SPINE WO  - [DATE]  [DATE]

RESULT:

Status post intrathecal injection of [F3], multiple images of the
lumbar spine were obtained in coronal, axial and sagittal planes.

The conus medullaris terminates at a T12 level.

The cauda equina demonstrate no evidence of clumping or thickening.

Postsurgical changes are identified within the lumbar spine from the L3-S1
level. This is evident by rod and screw fixation. There is a significant
amount of associated metallic susceptibility artifact associated with this
area of postsurgical change.

Evaluation of the T11-T12 level demonstrates no evidence of thecal sac
stenosis or neural foraminal narrowing.

At the T12-L1 level there is no evidence of thecal sac stenosis.

At the L1-L2 level there is transverse narrowing of the thecal sac secondary
to facet hypertrophy. This is a mild finding.

At the L2-L3 disc space level there is also evidence of transverse narrowing
of the thecal sac, mild secondary to ligamentum flavum hypertrophy. There is
otherwise no further evidence of thecal sac stenosis.

At the L3-4 and L4-L5 levels there is no evidence of thecal sac stenosis.
Evaluation is degraded by metallic streak artifact.

There is no CT evidence of appreciable neural foraminal narrowing. There is
mild dextroscoliosis of the lower lumbar spine.
IMPRESSION: Areas of mild thecal sac narrowing primarily in the
transverse dimensions as described above. There is no evidence of
significant thecal sac stenosis nor appreciable neural foraminal narrowing.
Evaluation is degraded by metallic susceptibility artifact as described
above.

## 2009-12-06 IMAGING — CR MYELOGRAM LUMBAR
1 series · 1 of 1 positions shown · non-contrast
Comparison: none

REASON FOR EXAM: BACK PAIN PRIOR LUMBAR SURGERY
COMMENTS:

[view not recorded]
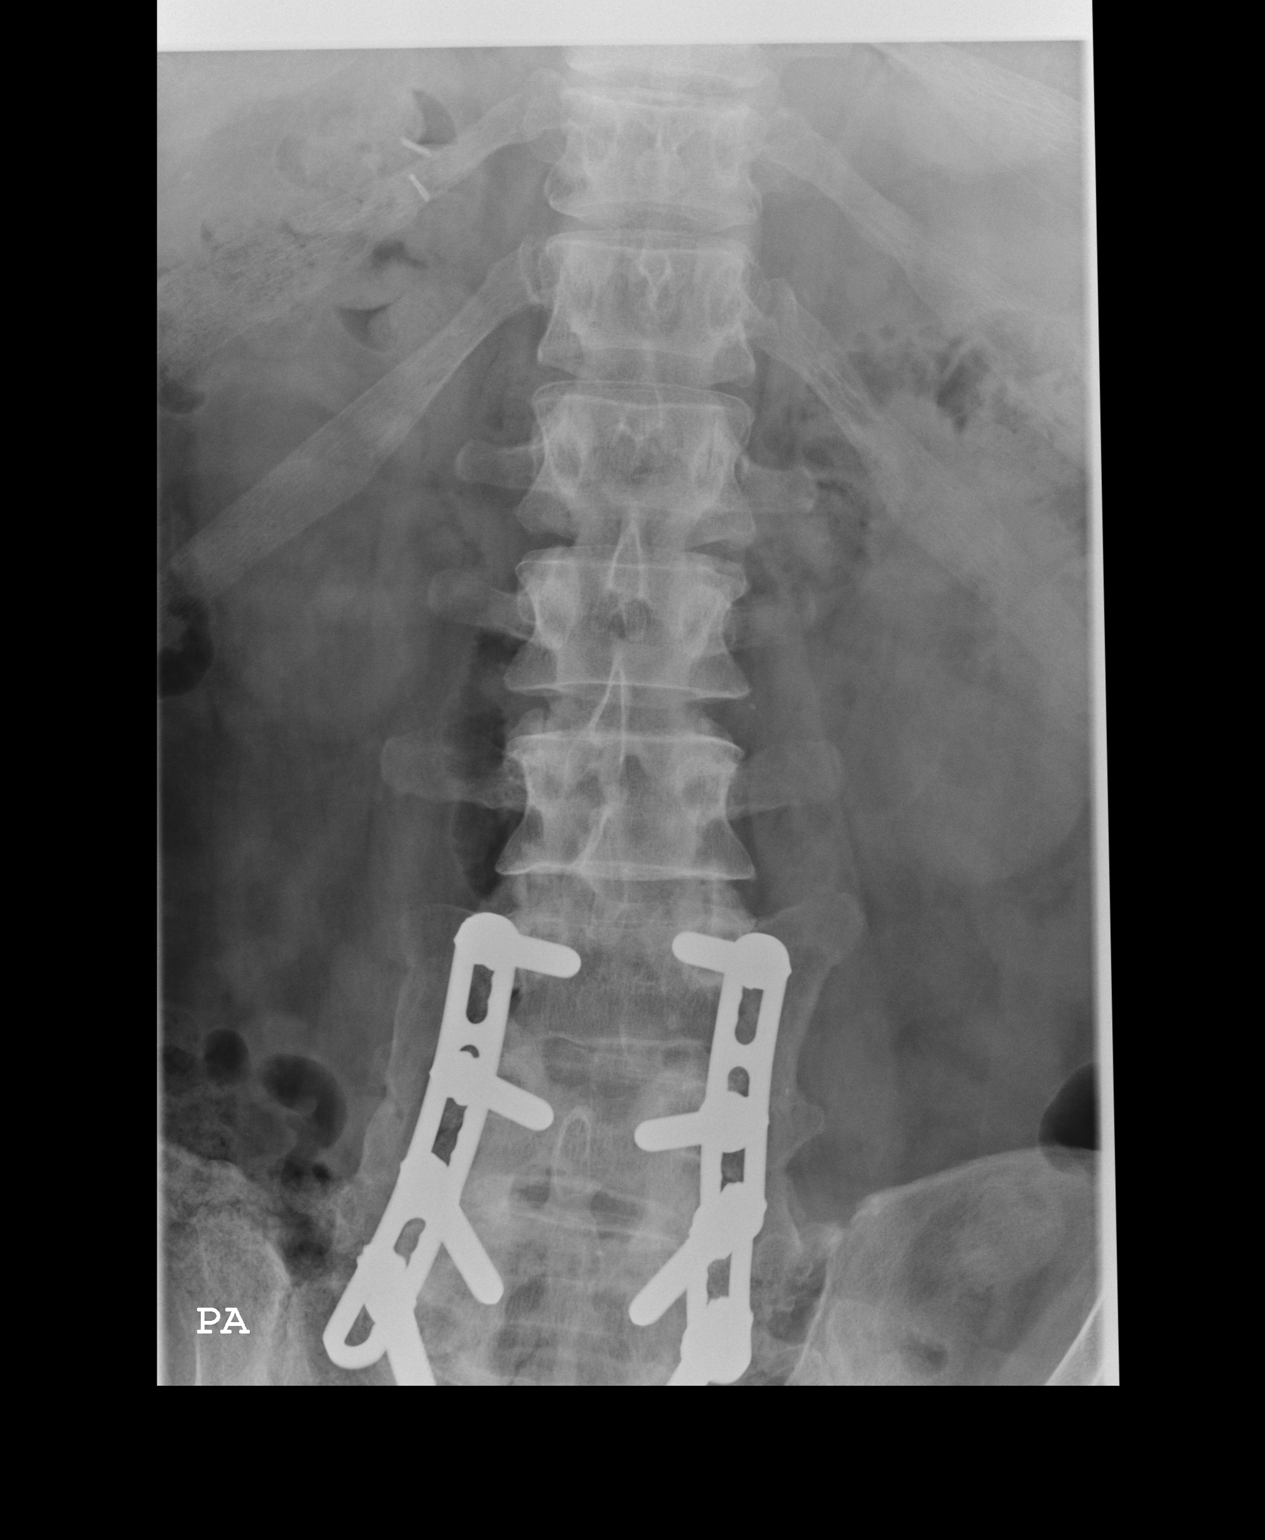

[1 of 1 positions shown; findings below may reference images not displayed]

PROCEDURE:     FL  - FL MYELOGRAM LUMBAR SPINE  - [DATE]  [DATE]

RESULT:     The patient was informed of the risks and benefits of the
procedure and proper informed consent was obtained. The patient was brought
to the Fluoroscopy Suite and placed in a supine position. The lumbar spine
was evaluated. A proper entry site for cannulation of the thecal sac was
established. The overlying soft tissues were then prepped and draped in the
usual sterile fashion utilizing universal technique. The overlying soft
tissues were anesthetized with 5 ml of 1% lidocaine without epinephrine. The
thecal sac was then cannulated at the entry site at the L4-5 level.
Cannulation was performed with a 21-gauge/3.5 inch spinal needle.
Appropriate needle placement was performed with fluoroscopic evaluation.
Clear CSF fluid was appreciated within the hub of the needle status post
placement. The thecal sac was then injected with 13 ml of Omnipaque 180.
Overhead imaging was obtained during the procedure and status post
procedure. The patient tolerated the procedure without complications. No
gross abnormalities are identified.
IMPRESSION: 1. Fluoroscopically-guided lumbar myelogram as described above. The patient
tolerated the procedure without complications.

## 2009-12-06 IMAGING — RF MYELOGRAM LUMBAR
1 series · 14 of 16 positions shown · non-contrast
Comparison: none

REASON FOR EXAM: BACK PAIN PRIOR LUMBAR SURGERY
COMMENTS:

[Series 1: run · 14 of 16 slices shown]
[im 1/16]
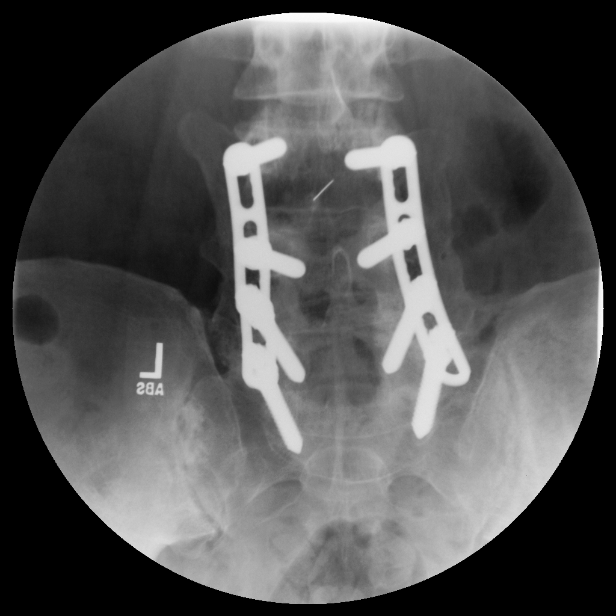
[im 2/16]
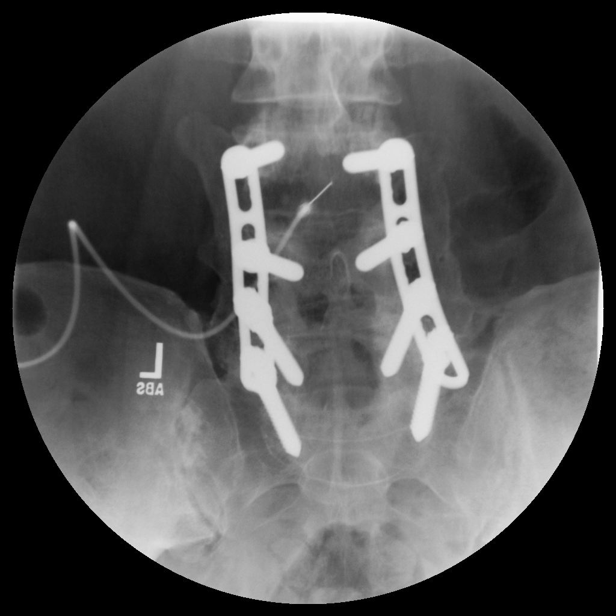
[im 3/16]
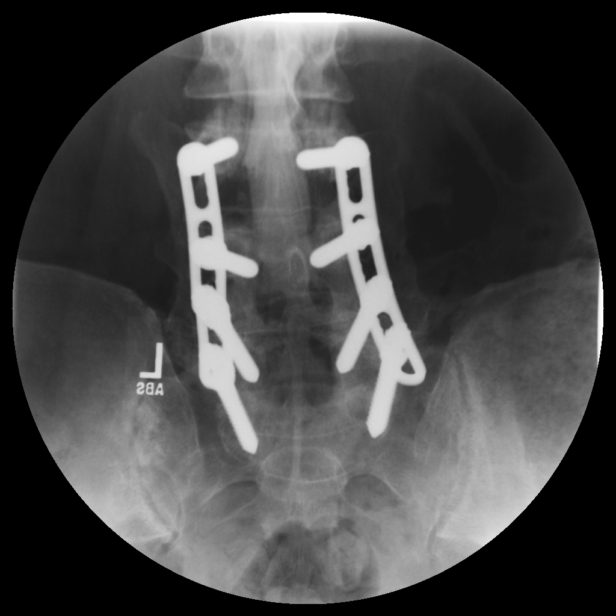
[im 5/16]
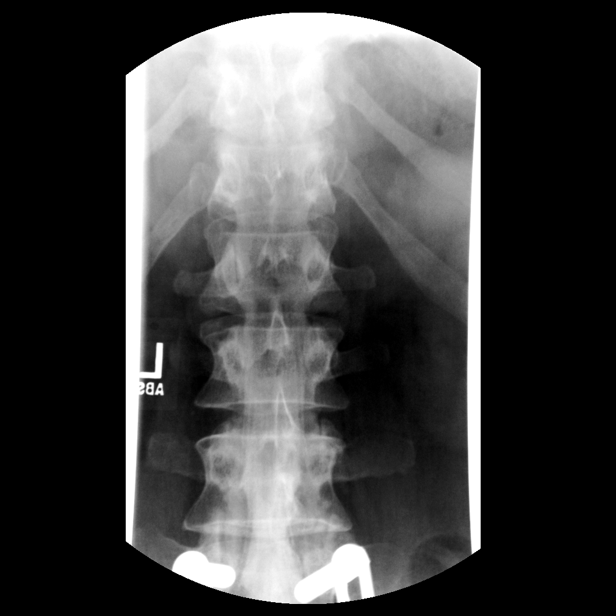
[im 6/16]
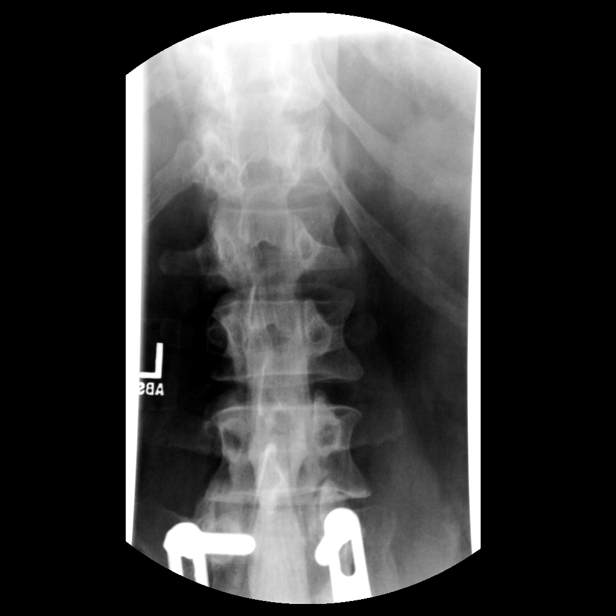
[im 7/16]
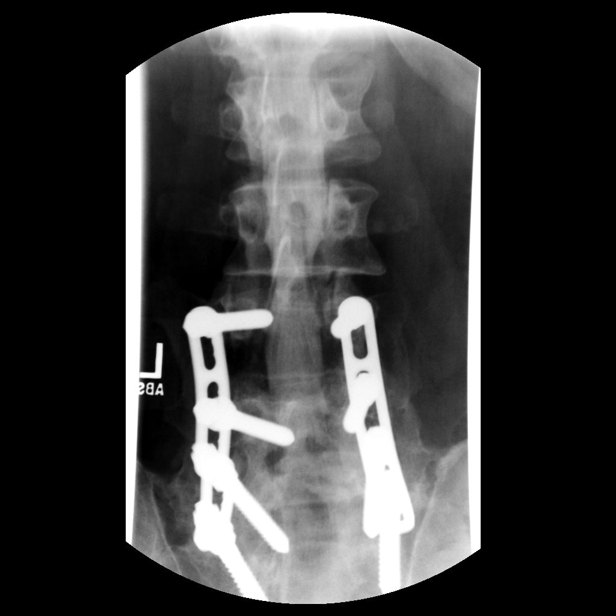
[im 8/16]
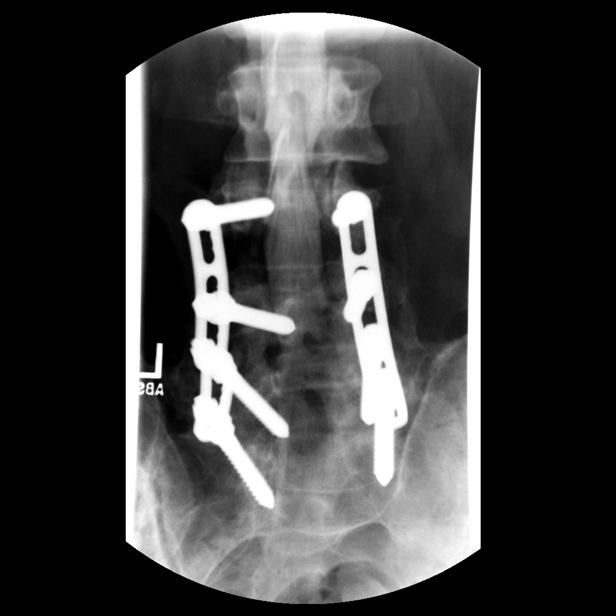
[im 9/16]
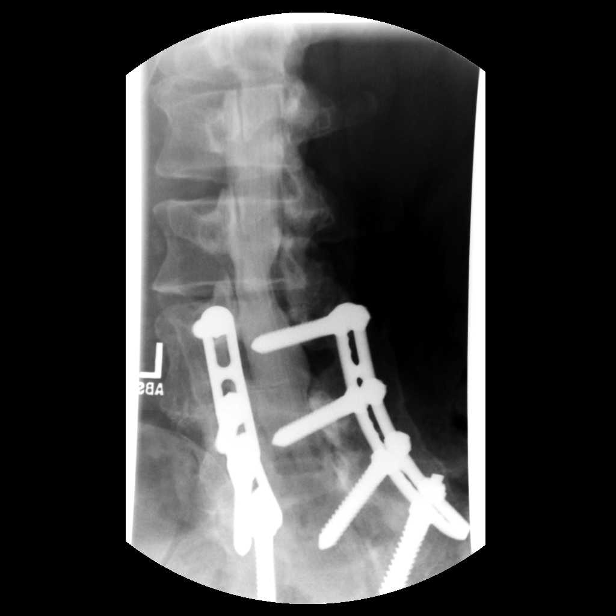
[im 10/16]
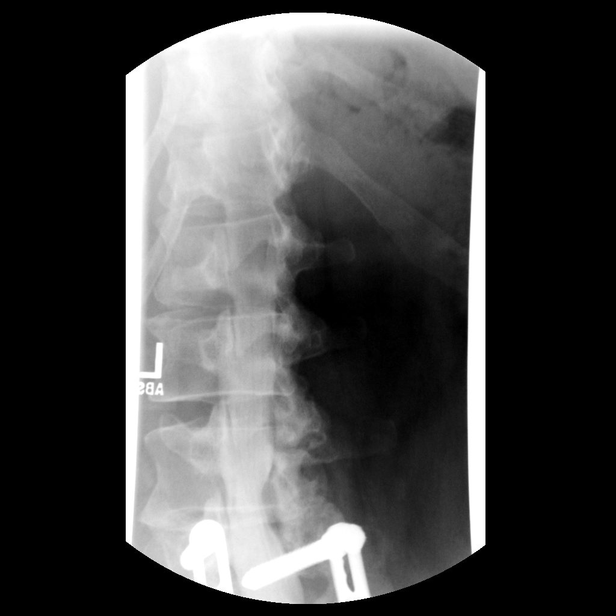
[im 11/16]
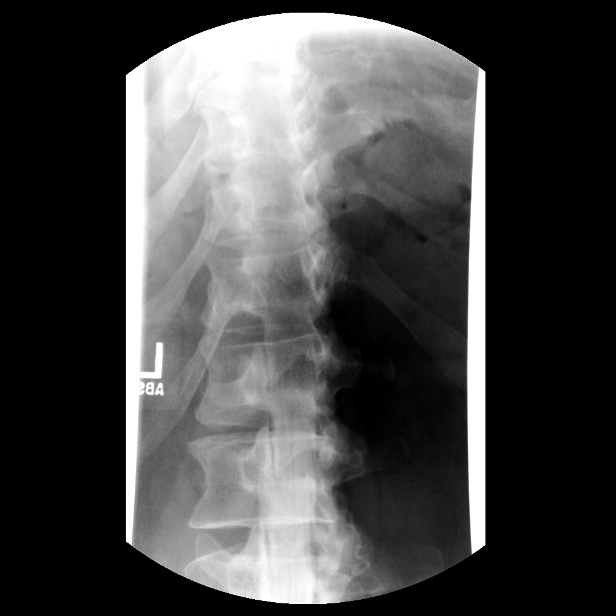
[im 13/16]
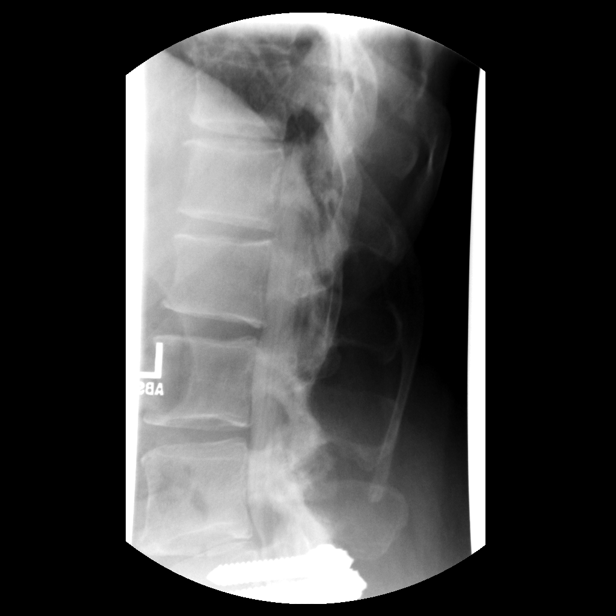
[im 14/16]
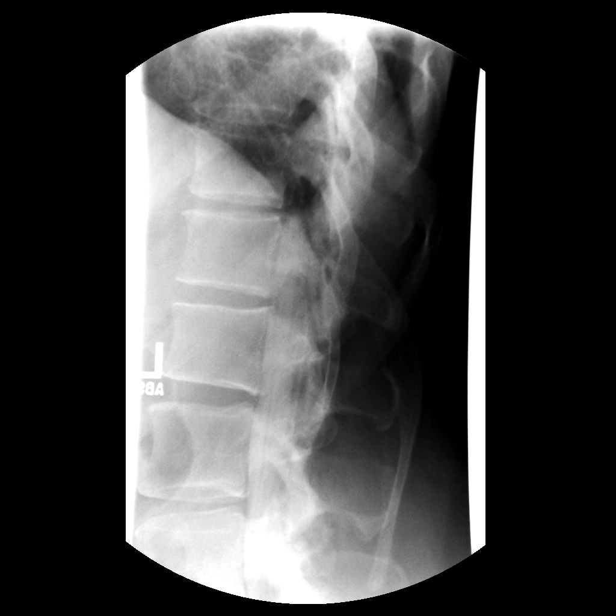
[im 15/16]
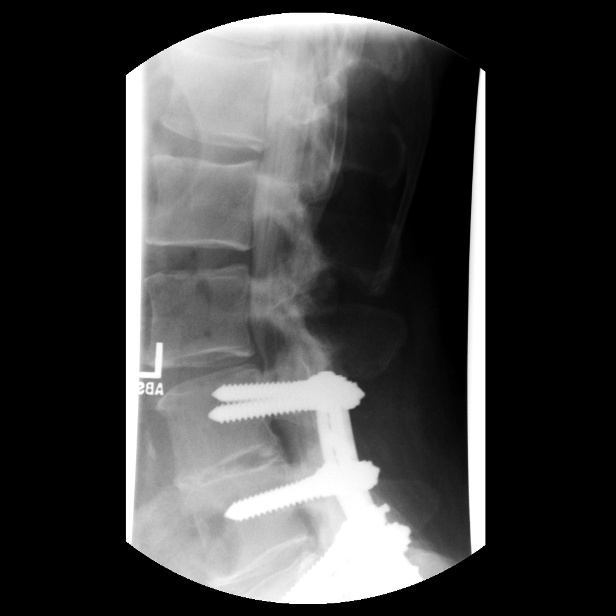
[im 16/16]
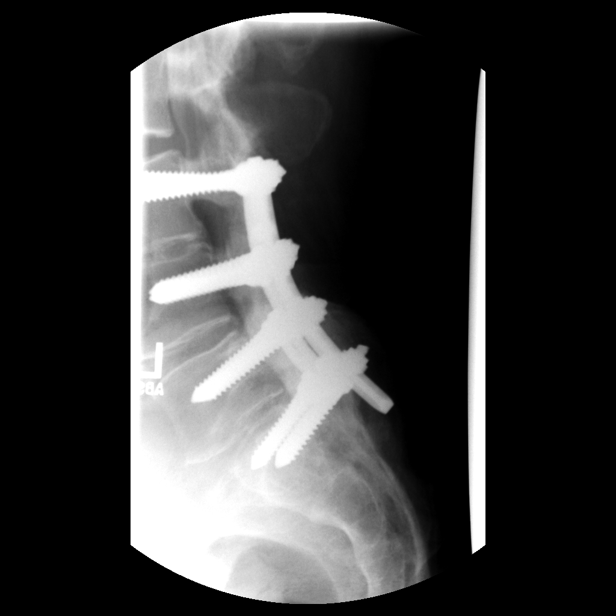

[14 of 16 positions shown; findings below may reference images not displayed]

PROCEDURE:     FL  - FL MYELOGRAM LUMBAR SPINE  - [DATE]  [DATE]

RESULT:     The patient was informed of the risks and benefits of the
procedure and proper informed consent was obtained. The patient was brought
to the Fluoroscopy Suite and placed in a supine position. The lumbar spine
was evaluated. A proper entry site for cannulation of the thecal sac was
established. The overlying soft tissues were then prepped and draped in the
usual sterile fashion utilizing universal technique. The overlying soft
tissues were anesthetized with 5 ml of 1% lidocaine without epinephrine. The
thecal sac was then cannulated at the entry site at the L4-5 level.
Cannulation was performed with a 21-gauge/3.5 inch spinal needle.
Appropriate needle placement was performed with fluoroscopic evaluation.
Clear CSF fluid was appreciated within the hub of the needle status post
placement. The thecal sac was then injected with 13 ml of Omnipaque 180.
Overhead imaging was obtained during the procedure and status post
procedure. The patient tolerated the procedure without complications. No
gross abnormalities are identified.
IMPRESSION: 1. Fluoroscopically-guided lumbar myelogram as described above. The patient
tolerated the procedure without complications.

## 2010-12-22 ENCOUNTER — Encounter: Payer: Self-pay | Admitting: Surgery

## 2010-12-25 ENCOUNTER — Encounter: Payer: Self-pay | Admitting: Surgery

## 2011-01-25 ENCOUNTER — Encounter: Payer: Self-pay | Admitting: Surgery

## 2011-03-08 ENCOUNTER — Ambulatory Visit: Payer: Self-pay | Admitting: Anesthesiology

## 2011-03-14 ENCOUNTER — Ambulatory Visit: Payer: Self-pay | Admitting: Unknown Physician Specialty

## 2012-06-04 ENCOUNTER — Ambulatory Visit: Payer: Self-pay | Admitting: Orthopedic Surgery

## 2012-06-04 IMAGING — CR DG KNEE COMPLETE 4+V*R*
1 series · 6 of 6 positions shown · non-contrast
Comparison: none

REASON FOR EXAM: knee pain
COMMENTS:

PROCEDURE:     DXR - DXR KNEE RT COMP WITH OBLIQUES  - [DATE]  [DATE]
RESULT:     Comparison: None.

[Series 1: w knee ap right · 0.14mm/px · 6 of 6 slices shown]
[im 1/6]
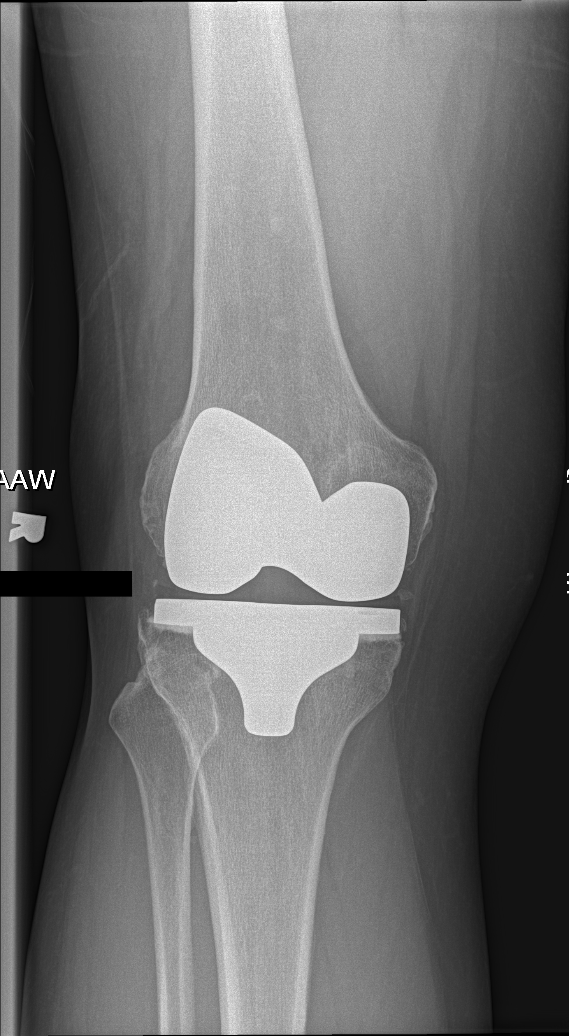
[im 2/6]
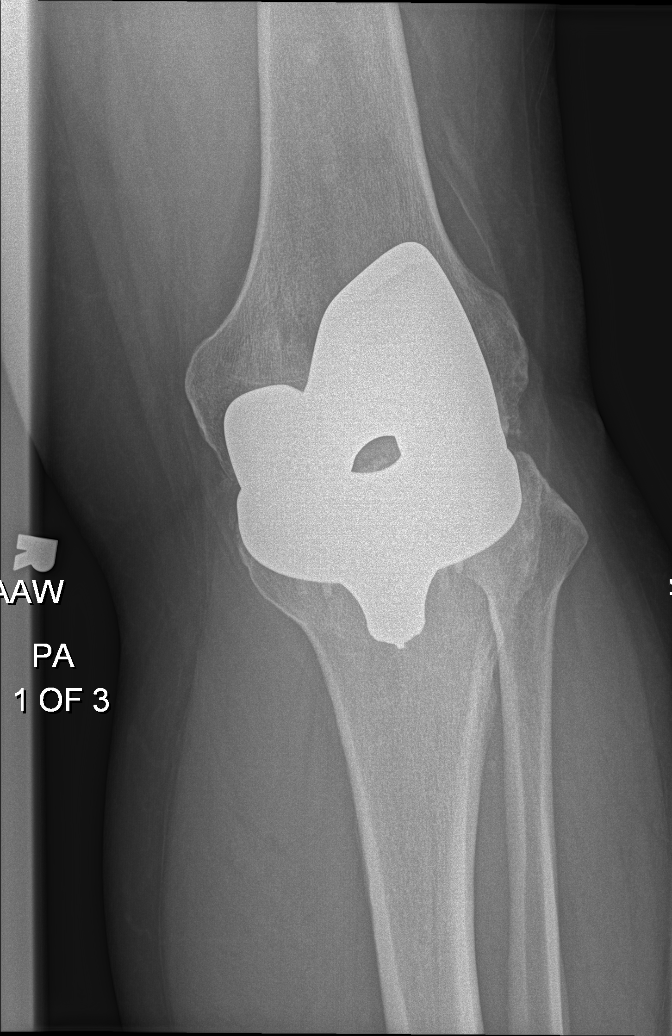
[im 3/6]
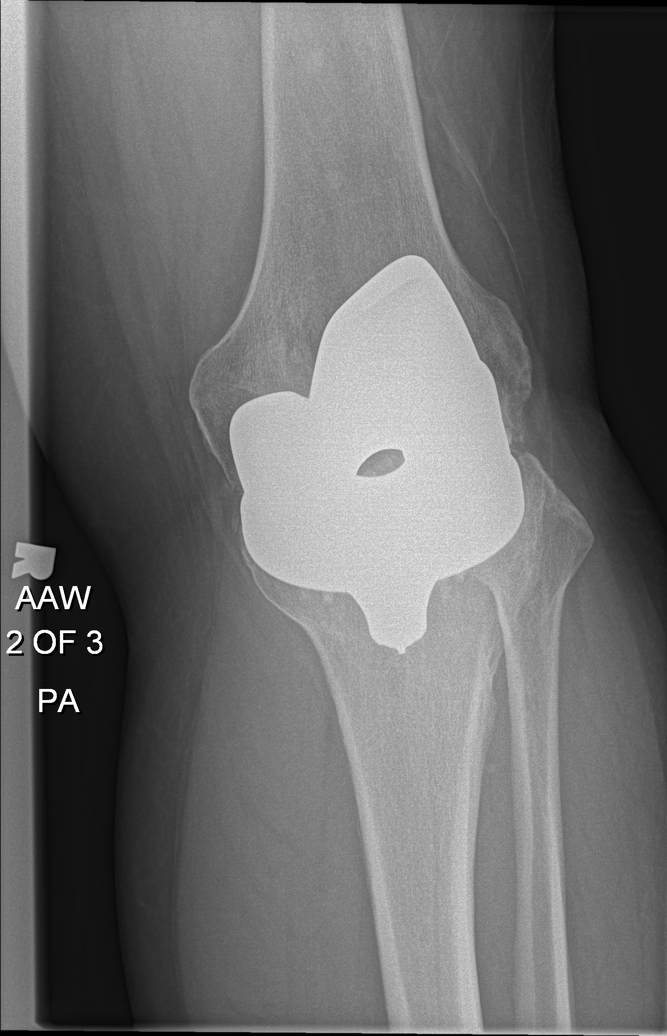
[im 4/6]
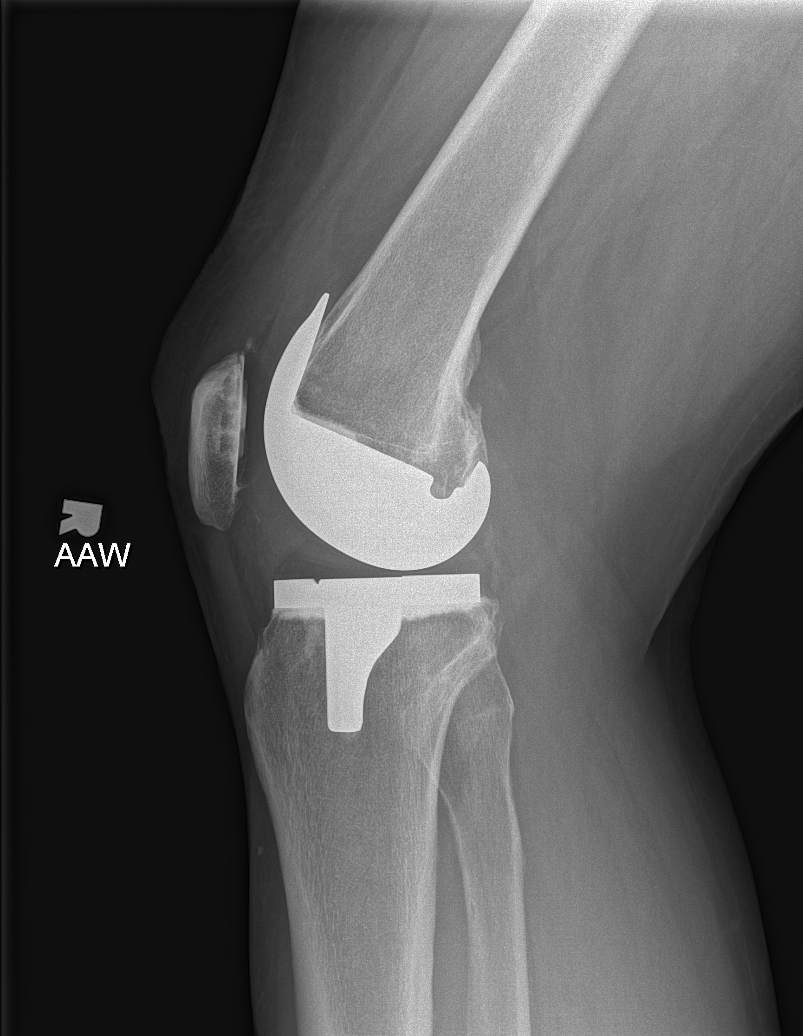
[im 5/6]
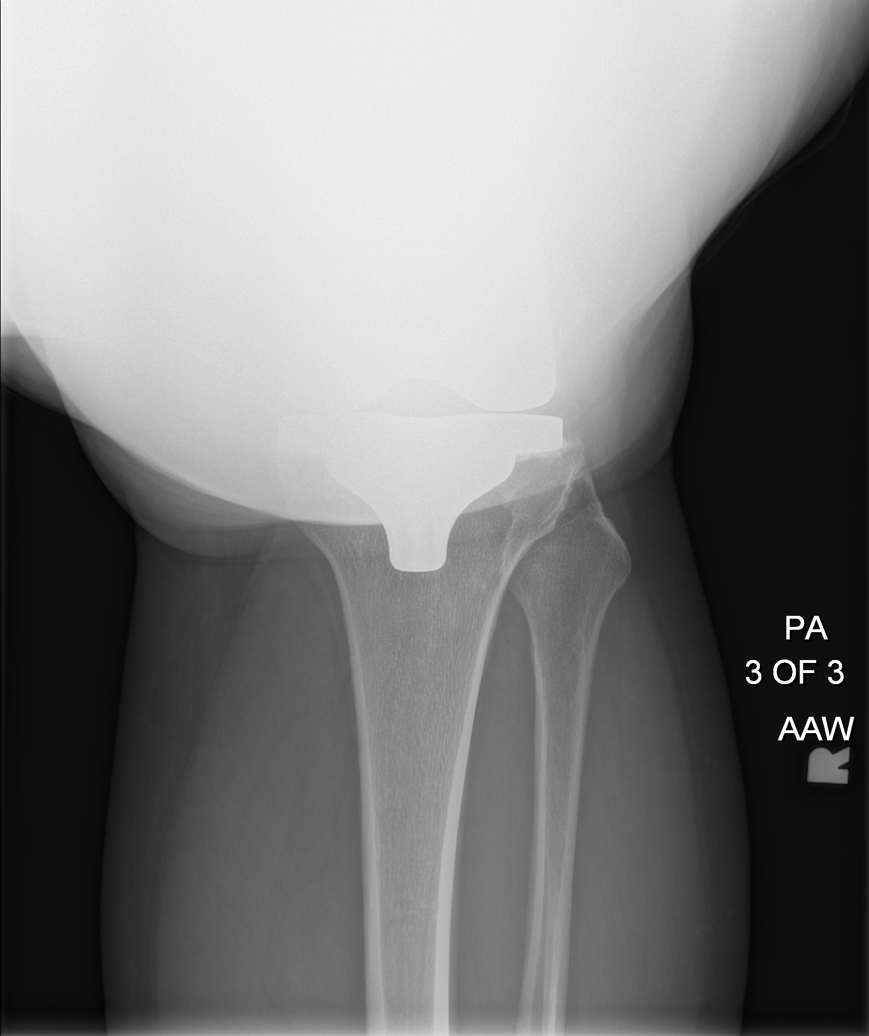
[im 6/6]
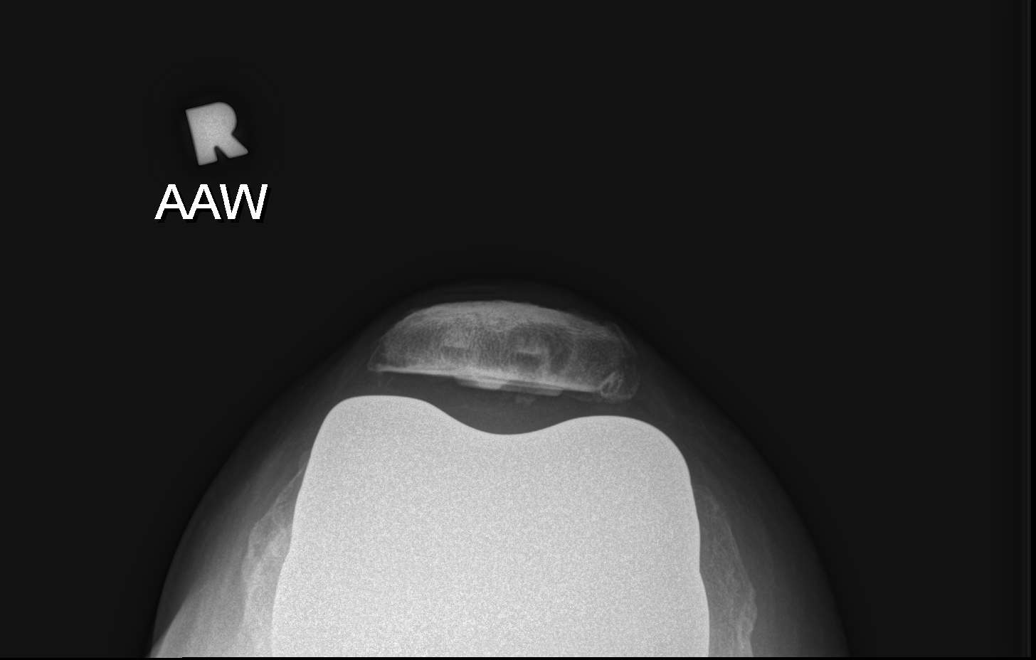

[6 of 6 positions shown; findings below may reference images not displayed]

FINDINGS: Hardware seen from right total knee arthroplasty. No perihardware lucency.
No acute fracture. Round lucency in the patella likely represents
subchondral cystic change.
IMPRESSION: No acute findings.

[REDACTED]

## 2013-11-10 DIAGNOSIS — I10 Essential (primary) hypertension: Secondary | ICD-10-CM | POA: Insufficient documentation

## 2014-10-29 DIAGNOSIS — Z96651 Presence of right artificial knee joint: Secondary | ICD-10-CM | POA: Insufficient documentation

## 2015-02-08 DIAGNOSIS — M5136 Other intervertebral disc degeneration, lumbar region: Secondary | ICD-10-CM | POA: Insufficient documentation

## 2015-02-08 DIAGNOSIS — G8929 Other chronic pain: Secondary | ICD-10-CM | POA: Insufficient documentation

## 2015-02-08 DIAGNOSIS — M62830 Muscle spasm of back: Secondary | ICD-10-CM | POA: Insufficient documentation

## 2015-02-08 DIAGNOSIS — M5416 Radiculopathy, lumbar region: Secondary | ICD-10-CM | POA: Insufficient documentation

## 2015-08-02 ENCOUNTER — Ambulatory Visit (INDEPENDENT_AMBULATORY_CARE_PROVIDER_SITE_OTHER): Payer: 59 | Admitting: Internal Medicine

## 2015-08-02 ENCOUNTER — Encounter: Payer: Self-pay | Admitting: Internal Medicine

## 2015-08-02 VITALS — BP 130/72 | HR 80 | Ht 71.0 in | Wt 232.4 lb

## 2015-08-02 DIAGNOSIS — G4726 Circadian rhythm sleep disorder, shift work type: Secondary | ICD-10-CM | POA: Diagnosis not present

## 2015-08-02 DIAGNOSIS — G4719 Other hypersomnia: Secondary | ICD-10-CM | POA: Diagnosis not present

## 2015-08-02 DIAGNOSIS — G4700 Insomnia, unspecified: Secondary | ICD-10-CM | POA: Diagnosis not present

## 2015-08-02 MED ORDER — ZOLPIDEM TARTRATE 10 MG PO TABS: 10.0000 mg | ORAL_TABLET | Freq: Every evening | ORAL | Status: DC | PRN

## 2015-08-02 MED ORDER — RAMELTEON 8 MG PO TABS: 8.0000 mg | ORAL_TABLET | ORAL | Status: DC | PRN

## 2015-08-02 NOTE — Progress Notes (Signed)
[facility] Consultation      Assessment and Plan:  Excessive daytime sleepiness.  -This is probably due to insomnia and lack of sleep.  Shift work sleep disorder.  -Will start ramelteon 8 mg qhs.  -Can use ambien 10 mg qhs when needed.   Insomnia, due to above.  -Most noted when the patient works shift that ends at 9 PM, and has trouble falling asleep.   Back pain -Patient has continued back pain which keeps him up at night. We'll need to continue monitoring. -He has an appointment to look into his back pain, hopefully this will help, which may help with his sleep issues.   Snoring. -Snoring with witnessed apneas, snorting, which is waking up his wife. -His appears to be suspicious for obstructive sleep apnea, we'll send for sleep study.  Essential hypertension. -sleep apnea, and insomnia. Can contribute to high blood pressure, therefore, we will need to monitor.     Date: 08/02/2015  MRN# [id number] Perry Jones 1951-04-24  Referring Physician: Self referral  Perry Jones is a 65 y.o. old male seen in consultation for chief complaint of:    Chief Complaint  Patient presents with  . sleep consult    self referral. pt. c/o trouble sleeping. occ. snoring per wife.  EPWORTH: 1    HPI:   The patient is a 65 year old male who has a complaint of difficulty sleeping. He typically goes to bed around 10:15 08/05/1928, but this can vary. He had typically taken 30-45 minutes to fall asleep, though now notes cannot fall asleep and feels that he is awake all night. He gets out of bed typically between 7:30 AM and 8 AM. He has not had a significant amount of weight loss or weight gain in the past few years.  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 His sleep problems began about 2 years ago. His schedules change about every 6 months. Currently he does a  morning shift.  He has severe pain at night which keeps him up at night. He takes [place] every other night.   Currently he starts at 530 pm until 930 pm. He notes that he has most trouble falling asleep on the days when he works until 930. He notes that the back pain complicates things.   He takes ambien 10 mg 5 days per week. He feels that it helps and sleeps well when he takes it.  He has tried Zquil. He has taken melatonin in the past but it stopped working.   Reviewed patient outside charts/records; he has had tried various different medications and his sleep seems most disturbed when shifts change.   Notes that he sometimes wakes his wife with snoring/snorting.   PMHX:   Past Medical History  Diagnosis Date  . Hypertension   . High cholesterol   . Sinus trouble    Surgical Hx:  Past Surgical History  Procedure Laterality Date  . Right knee replaced  2007  . Knee arthroscopy      right knee  . L1-l4 fuzed      2000   Family Hx:  Family History  Problem Relation Age of Onset  . Family history unknown: Yes   Social Hx:   Social History  Substance Use Topics  . Smoking status: Former Smoker    Quit date: 01/24/1985  . Smokeless tobacco: None  .  Alcohol Use: Yes     Comment: rare   Medication:   Current Outpatient Rx  Name  Route  Sig  Dispense  Refill  . Ascorbic Acid (VITAMIN C) 1000 MG tablet   Oral   Take by mouth.         . benazepril-hydrochlorthiazide (LOTENSIN HCT) 20-12.5 MG tablet   Oral   Take by mouth.         . Biotin 1000 MCG tablet   Oral   Take by mouth.         . fluticasone (FLONASE) 50 MCG/ACT nasal spray   Nasal   Place into the nose.         Marland Kitchen HYDROcodone-acetaminophen (NORCO) 7.5-325 MG tablet   Oral   Take by mouth.         Marland Kitchen LORazepam (ATIVAN) 0.5 MG tablet   Oral   Take by mouth.         . lovastatin (MEVACOR) 40 MG tablet               . Multiple Vitamin (MULTI-VITAMINS) TABS   Oral   Take by mouth.           . orphenadrine (NORFLEX) 100 MG tablet   Oral   Take by mouth.         . zolpidem (AMBIEN) 10 MG tablet      Take 1 tablet at bedtime PRN for sleep             Allergies:  Review of patient's allergies indicates not on file.  Review of Systems: Gen:  Denies  fever, sweats, chills HEENT: Denies blurred vision, double vision. bleeds, sore throat Cvc:  No dizziness, chest pain. Resp:   Denies cough or sputum porduction, shortness of breath Gi: Denies swallowing difficulty, stomach pain. Gu:  Denies bladder incontinence, burning urine Ext:   No Joint pain, stiffness. Skin: No skin rash,  hives Endoc:  No polyuria, polydipsia. Psych: No depression, insomnia. Other:  All other systems were reviewed with the patient and were negative other that what is mentioned in the HPI.   Physical Examination:   VS: BP 130/72 mmHg  Pulse 80  Ht 5\' 11"  (1.803 m)  Wt 232 lb 6.4 oz (105.416 kg)  BMI 32.43 kg/m2  SpO2 96%  General Appearance: No distress  Neuro:without focal findings,  speech normal,  HEENT: PERRLA, EOM intact.   Pulmonary: normal breath sounds, No wheezing.  CardiovascularNormal S1,S2.  No m/r/g.   Abdomen: Benign, Soft, non-tender. Renal:  No costovertebral tenderness  GU:  No performed at this time. Endoc: No evident thyromegaly, no signs of acromegaly. Skin:   warm, no rashes, no ecchymosis  Extremities: normal, no cyanosis, clubbing.  Other findings:    LABORATORY PANEL:   CBC No results for input(s): WBC, HGB, HCT, PLT in the last 168 hours. ------------------------------------------------------------------------------------------------------------------  Chemistries  No results for input(s): NA, K, CL, CO2, GLUCOSE, BUN, CREATININE, CALCIUM, MG, AST, ALT, ALKPHOS, BILITOT in the last 168 hours.  Invalid input(s): GFRCGP ------------------------------------------------------------------------------------------------------------------  Cardiac  Enzymes No results for input(s): TROPONINI in the last 168 hours. ------------------------------------------------------------  RADIOLOGY:  No results found.     Thank  you for the consultation and for allowing [facility] to assist in the care of your patient. Our recommendations are noted above.  Please contact us if we can be of further service.   Marda Stalker, MD.  Board Certified in Internal Medicine, Pulmonary Medicine,  Critical Care Medicine, and Sleep Medicine.  [facility]   Patricia Pesa, M.D.  Vilinda Boehringer, M.D.  Merton Border, M.D

## 2015-08-02 NOTE — Patient Instructions (Addendum)
Start ramelteo 8 mg every night.  You can continue ambien 10 mg qhs.   Please download an app to track your sleep at night and let us know what it says in 1 month.   Will check sleep study.    Sleep Apnea Sleep apnea is disorder that affects a person's sleep. A person with sleep apnea has abnormal pauses in their breathing when they sleep. It is hard for them to get a good sleep. This makes a person tired during the day. It also can lead to other physical problems. There are three types of sleep apnea. One type is when breathing stops for a short time because your airway is blocked (obstructive sleep apnea). Another type is when the brain sometimes fails to give the normal signal to breathe to the muscles that control your breathing (central sleep apnea). The third type is a combination of the other two types. HOME CARE   Take all medicine as told by your doctor.  Avoid alcohol, calming medicines (sedatives), and depressant drugs.  Try to lose weight if you are overweight. Talk to your doctor about a healthy weight goal.  Your doctor may have you use a device that helps to open your airway. It can help you get the air that you need. It is called a positive airway pressure (PAP) device.   MAKE SURE YOU:   Understand these instructions.  Will watch your condition.  Will get help right away if you are not doing well or get worse.  It may take approximately 1 month for you to get used to wearing her CPAP every night.

## 2015-08-08 ENCOUNTER — Encounter: Payer: Self-pay | Admitting: Internal Medicine

## 2015-08-09 DIAGNOSIS — G4733 Obstructive sleep apnea (adult) (pediatric): Secondary | ICD-10-CM | POA: Diagnosis not present

## 2015-08-25 ENCOUNTER — Other Ambulatory Visit: Payer: Self-pay | Admitting: *Deleted

## 2015-08-25 DIAGNOSIS — G4719 Other hypersomnia: Secondary | ICD-10-CM

## 2015-08-25 DIAGNOSIS — G4733 Obstructive sleep apnea (adult) (pediatric): Secondary | ICD-10-CM | POA: Diagnosis not present

## 2015-08-26 ENCOUNTER — Encounter: Payer: Self-pay | Admitting: Internal Medicine

## 2015-08-30 ENCOUNTER — Telehealth: Payer: Self-pay | Admitting: *Deleted

## 2015-08-30 DIAGNOSIS — G4733 Obstructive sleep apnea (adult) (pediatric): Secondary | ICD-10-CM

## 2015-08-30 NOTE — Telephone Encounter (Signed)
Pt informed of needing a titration study. Order placed.

## 2015-09-02 ENCOUNTER — Encounter: Payer: Self-pay | Admitting: Internal Medicine

## 2015-09-14 ENCOUNTER — Encounter: Payer: Self-pay | Admitting: Internal Medicine

## 2015-09-16 ENCOUNTER — Other Ambulatory Visit: Payer: Self-pay | Admitting: *Deleted

## 2015-09-16 MED ORDER — ZOLPIDEM TARTRATE 10 MG PO TABS: ORAL_TABLET | ORAL | Status: DC

## 2015-10-15 ENCOUNTER — Encounter: Payer: Self-pay | Admitting: Internal Medicine

## 2015-10-15 ENCOUNTER — Ambulatory Visit (INDEPENDENT_AMBULATORY_CARE_PROVIDER_SITE_OTHER): Payer: 59 | Admitting: Internal Medicine

## 2015-10-15 VITALS — BP 130/68 | HR 74 | Ht 72.0 in | Wt 226.6 lb

## 2015-10-15 DIAGNOSIS — G4733 Obstructive sleep apnea (adult) (pediatric): Secondary | ICD-10-CM | POA: Diagnosis not present

## 2015-10-15 MED ORDER — ZOLPIDEM TARTRATE 5 MG PO TABS: 5.0000 mg | ORAL_TABLET | Freq: Every evening | ORAL | Status: DC | PRN

## 2015-10-15 NOTE — Progress Notes (Signed)
* [facility]     Assessment and Plan:  Obstructive sleep apnea.Marland Kitchen  -AHI of 11, recommended that he be started on CPAP, but he does not feel that it is a problem at this time, therefore he declined.   Shift work sleep disorder. - Continue ambien at night to help fall asleep  Insomnia, due to above.  -Decrease ambien to 5 mg qhs, as 10 mg leads to some daytime sleepiness in the am.    Back pain -Patient has continued back pain which keeps him up at night. We'll need to continue monitoring. -He has an appointment to look into his back pain, hopefully this will help, which may help with his sleep issues.    Essential hypertension. -sleep apnea, and insomnia. Can contribute to high blood pressure, therefore, we will need to monitor.    Date: 10/15/2015  MRN# [id number] Perry Jones Jun 13, 1951   Perry Jones is a 65 y.o. old male seen in follow up for chief complaint of  Chief Complaint  Patient presents with  . Follow-up    pt states he would like to try to cut back to 5mg  of ambien to see how it works for him. no new concerns today.      HPI:  The patient is a 65 year old male who has been having trouble with daytime sleepiness, as well as occasional problems with falling asleep. 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  At last visit it was noted that he was waking up with snoring and snorting, therefore, was sent for a sleep study. Review of sleep study tracings today formed on 07/30/2015 showed an AHI of 11. He declined a CPAP titration. He has an app and is found that shows that he is sleeping well, and therefore feels that he does not require further sleep testing.  He continues to have insomnia, he has used [place] but occasionally feels tired in the AM, he is currently on 10 mg. He has tried ramelteon but did not find it helpful.   He takes ambien 10 mg 5 days per week. He  feels that it helps and sleeps well when he takes it.  He has tried Zquil. He has taken melatonin in the past but it stopped working.    Medication:   Outpatient Encounter Prescriptions as of 10/15/2015  Medication Sig  . Ascorbic Acid (VITAMIN C) 1000 MG tablet Take by mouth.  . benazepril-hydrochlorthiazide (LOTENSIN HCT) 20-12.5 MG tablet Take by mouth.  . Biotin 1000 MCG tablet Take by mouth.  . fluticasone (FLONASE) 50 MCG/ACT nasal spray Place into the nose.  Marland Kitchen HYDROcodone-acetaminophen (NORCO) 7.5-325 MG tablet Take by mouth.  Marland Kitchen LORazepam (ATIVAN) 0.5 MG tablet Take by mouth.  . lovastatin (MEVACOR) 40 MG tablet   . Multiple Vitamin (MULTI-VITAMINS) TABS Take by mouth.  . orphenadrine (NORFLEX) 100 MG tablet Take by mouth.  . ramelteon (ROZEREM) 8 MG tablet Take 1 tablet (8 mg total) by mouth as needed for sleep.  Marland Kitchen zolpidem (AMBIEN) 10 MG tablet Take 1 tablet (10 mg total) by mouth at bedtime as needed for sleep.  Marland Kitchen zolpidem (AMBIEN) 10 MG tablet Take 1 tablet at bedtime PRN for sleep   No facility-administered encounter medications on file as of 10/15/2015.     Allergies:  Review of patient's allergies indicates not on file.  Review of Systems: Gen:  Denies  fever, sweats. HEENT: Denies blurred  vision. Cvc:  No dizziness, chest pain or heaviness Resp:   Denies cough or sputum porduction. Gi: Denies swallowing difficulty, stomach pain. constipation, bowel incontinence Gu:  Denies bladder incontinence, burning urine Ext:   No Joint pain, stiffness. Skin: No skin rash, easy bruising. Endoc:  No polyuria, polydipsia. Psych: No depression, insomnia. Other:  All other systems were reviewed and found to be negative other than what is mentioned in the HPI.   Physical Examination:   VS: BP 130/68 mmHg  Pulse 74  Ht 6' (1.829 m)  Wt 226 lb 9.6 oz (102.785 kg)  BMI 30.73 kg/m2  SpO2 100%  General Appearance: No distress  Neuro:without focal findings,  speech normal,    HEENT: PERRLA, EOM intact. Pulmonary: normal breath sounds, No wheezing.   CardiovascularNormal S1,S2.  No m/r/g.   Abdomen: Benign, Soft, non-tender. Renal:  No costovertebral tenderness  GU:  Not performed at this time. Endoc: No evident thyromegaly, no signs of acromegaly. Skin:   warm, no rash. Extremities: normal, no cyanosis, clubbing.   LABORATORY PANEL:   CBC No results for input(s): WBC, HGB, HCT, PLT in the last 168 hours. ------------------------------------------------------------------------------------------------------------------  Chemistries  No results for input(s): NA, K, CL, CO2, GLUCOSE, BUN, CREATININE, CALCIUM, MG, AST, ALT, ALKPHOS, BILITOT in the last 168 hours.  Invalid input(s): GFRCGP ------------------------------------------------------------------------------------------------------------------  Cardiac Enzymes No results for input(s): TROPONINI in the last 168 hours. ------------------------------------------------------------  RADIOLOGY:   No results found for this or any previous visit. Results for orders placed in visit on 09/25/06  DG Chest 2 View   Narrative * PRIOR REPORT IMPORTED FROM AN EXTERNAL SYSTEM *   PRIOR REPORT IMPORTED FROM THE SYNGO WORKFLOW SYSTEM   REASON FOR EXAM:    htn  COMMENTS:   PROCEDURE:     DXR - DXR CHEST PA (OR AP) AND LATERAL  - Sep 25 2006   9:27AM   RESULT:     Comparison is made to a 17 January 2006.   The lungs are adequately inflated. Slightly increased density is noted at  the LEFT lung base but no discrete infiltrate is seen. The heart and  pulmonary vascularity are within the limits of normal.   IMPRESSION:   I do not see objective evidence of acute cardiopulmonary abnormality.   Thank you for the opportunity to contribute to the care of your patient.       ------------------------------------------------------------------------------------------------------------------  Thank  you for  allowing [facility] to assist in the care of your patient. Our recommendations are noted above.  Please contact us if we can be of further service.   Marda Stalker, MD.  [facility] Office Number: [phone]  Patricia Pesa, M.D.  Vilinda Boehringer, M.D.  Merton Border, M.D  10/15/2015

## 2015-10-15 NOTE — Patient Instructions (Signed)
Change ambien prescription to 5 mg qhs.

## 2015-10-22 ENCOUNTER — Other Ambulatory Visit: Payer: Self-pay | Admitting: Internal Medicine

## 2015-10-25 ENCOUNTER — Encounter: Payer: Self-pay | Admitting: Internal Medicine

## 2015-10-25 MED ORDER — ZOLPIDEM TARTRATE 10 MG PO TABS
10.0000 mg | ORAL_TABLET | Freq: Every evening | ORAL | Status: DC | PRN
Start: 2015-10-25 — End: 2015-12-03

## 2015-10-25 NOTE — Telephone Encounter (Signed)
Please advise if you want Ambien refilled. Pt sent a message with the refill request.   Erline Hau would like a refill of the following medications: zolpidem (AMBIEN) 10 MG tablet Laverle Hobby, MD]  Preferred pharmacy: [facility], [place] - [address]  Comment: The 5 mg worked fine the first day, but the second and third nights it did NOT work. I guess I will try again this weekend, but would like to refill the 10mg  on Monday

## 2015-10-25 NOTE — Telephone Encounter (Signed)
May change to ambien 10 mg qhs.

## 2015-11-10 DIAGNOSIS — E785 Hyperlipidemia, unspecified: Secondary | ICD-10-CM | POA: Insufficient documentation

## 2015-12-03 ENCOUNTER — Other Ambulatory Visit: Payer: Self-pay | Admitting: Internal Medicine

## 2015-12-06 MED ORDER — ZOLPIDEM TARTRATE 10 MG PO TABS: 10.0000 mg | ORAL_TABLET | Freq: Every evening | ORAL | Status: DC | PRN

## 2015-12-06 NOTE — Telephone Encounter (Signed)
Misty please have Dr. Ashby Dawes advise on refill. Thanks.

## 2016-01-05 ENCOUNTER — Other Ambulatory Visit: Payer: Self-pay | Admitting: Internal Medicine

## 2016-01-06 MED ORDER — ZOLPIDEM TARTRATE 10 MG PO TABS: 10.0000 mg | ORAL_TABLET | Freq: Every evening | ORAL | Status: DC | PRN

## 2016-02-03 ENCOUNTER — Other Ambulatory Visit: Payer: Self-pay | Admitting: Internal Medicine

## 2016-02-04 MED ORDER — ZOLPIDEM TARTRATE 10 MG PO TABS: 10.0000 mg | ORAL_TABLET | Freq: Every evening | ORAL | 2 refills | Status: DC | PRN

## 2016-03-09 ENCOUNTER — Telehealth: Payer: Self-pay | Admitting: Internal Medicine

## 2016-03-09 ENCOUNTER — Other Ambulatory Visit: Payer: Self-pay | Admitting: Internal Medicine

## 2016-03-09 MED ORDER — ZOLPIDEM TARTRATE 10 MG PO TABS: ORAL_TABLET | ORAL | 1 refills | Status: DC

## 2016-03-09 NOTE — Telephone Encounter (Deleted)
Pt states

## 2016-03-09 NOTE — Telephone Encounter (Signed)
Pt calling asking if we can check to see if we can renew his Ambien prescription  Please call if we did not get a request.

## 2016-03-09 NOTE — Telephone Encounter (Signed)
Pt aware that rx will be sent to preferred pharmacy. Nothing further needed.

## 2016-04-07 ENCOUNTER — Telehealth: Payer: Self-pay | Admitting: Internal Medicine

## 2016-04-07 NOTE — Telephone Encounter (Signed)
Spoke with pt and made him aware that on 03-09-16 we sent in a rx with 1 refill and advised him to contact his pharmacy as it is too soon for [place] to do a refill. Pt voiced understanding and had no further questions. Nothing further needed.

## 2016-04-07 NOTE — Telephone Encounter (Signed)
°*  STAT* If patient is at the pharmacy, call can be transferred to refill team.   1. Which medications need to be refilled? (please list name of each medication and dose if known) Zolpidem Tartrate   2. Which pharmacy/location (including street and city if local pharmacy) is medication to be sent to? [facility]   3. Do they need a 30 day or 90 day supply? 90 day

## 2016-04-07 NOTE — Telephone Encounter (Signed)
LMOVM Will await call back.  

## 2016-07-04 ENCOUNTER — Other Ambulatory Visit: Payer: Self-pay | Admitting: *Deleted

## 2016-07-04 MED ORDER — ZOLPIDEM TARTRATE 10 MG PO TABS: 10.0000 mg | ORAL_TABLET | Freq: Every evening | ORAL | 2 refills | Status: DC | PRN

## 2016-07-23 NOTE — Progress Notes (Signed)
* [facility]     Assessment and Plan:  Obstructive sleep apnea.Marland Kitchen  -AHI of 11, recommended that he be started on CPAP, but he does not feel that it is a problem at this time, therefore he declined.   Shift work sleep disorder. - see below  Insomnia, due to above.  -Will start rozerem 8 mg qhs.  --If not helpful can try sonata.  -He had been doing well with Ambien, but was recommended to stop this due to potential interaction with hydrocodone.   Back pain -Patient has continued back pain which keeps him up at night. We'll need to continue monitoring. -He has an appointment to look into his back pain, hopefully this will help, which may help with his sleep issues.    Essential hypertension. -sleep apnea, and insomnia. Can contribute to high blood pressure, therefore, we will need to monitor.    Date: 07/23/2016  MRN# [id number] Perry Jones 06/19/1951   Perry Jones is a 66 y.o. old male seen in follow up for chief complaint of  No chief complaint on file.    HPI:  The patient is a 66 year old male who has been having trouble with daytime sleepiness, as well as occasional problems with falling asleep. 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 At last visit it was noted that he was having insomnia, therefore he was being managed with [place]. He is known to have OSA, but declined PAP.   sleep study  07/30/2015 showed an AHI of 11.   He continues to have insomnia, he was doing well with Lorrin Mais, but he was advised by [facility] to stop due to potential interaction with hydrocodone for back and knee pain. He was suggested to try sonata instead. He was tried on melatonin and belsomra which did not help.     Medication:   Outpatient Encounter Prescriptions as of 07/24/2016  Medication Sig  . Ascorbic Acid (VITAMIN C) 1000 MG tablet Take by mouth.  . benazepril-hydrochlorthiazide (LOTENSIN  HCT) 20-12.5 MG tablet Take by mouth.  . Biotin 1000 MCG tablet Take by mouth.  . fluticasone (FLONASE) 50 MCG/ACT nasal spray Place into the nose.  Marland Kitchen HYDROcodone-acetaminophen (NORCO) 7.5-325 MG tablet Take by mouth.  Marland Kitchen LORazepam (ATIVAN) 0.5 MG tablet Take by mouth.  . lovastatin (MEVACOR) 40 MG tablet   . Multiple Vitamin (MULTI-VITAMINS) TABS Take by mouth.  . orphenadrine (NORFLEX) 100 MG tablet Take by mouth.  . ramelteon (ROZEREM) 8 MG tablet Take 1 tablet (8 mg total) by mouth as needed for sleep.  Marland Kitchen zolpidem (AMBIEN) 10 MG tablet Take 1 tablet (10 mg total) by mouth at bedtime as needed for sleep.   No facility-administered encounter medications on file as of 07/24/2016.      Allergies:  Prednisone  Review of Systems: Gen:  Denies  fever, sweats. HEENT: Denies blurred vision. Cvc:  No dizziness, chest pain or heaviness Resp:   Denies cough or sputum porduction. Gi: Denies swallowing difficulty, stomach pain. constipation, bowel incontinence Gu:  Denies bladder incontinence, burning urine Ext:   No Joint pain, stiffness. Skin: No skin rash, easy bruising. Endoc:  No polyuria, polydipsia. Psych: No depression, insomnia. Other:  All other systems were reviewed and found to be negative other than what is mentioned in the HPI.   Physical Examination:   VS: BP 140/80 (BP Location: Left Arm, Cuff Size: Normal)   Pulse 78  Ht 5\' 11"  (1.803 m)   Wt 236 lb 3.2 oz (107.1 kg)   SpO2 97%   BMI 32.94 kg/m   General Appearance: No distress  Neuro:without focal findings,  speech normal,  HEENT: PERRLA, EOM intact. Pulmonary: normal breath sounds, No wheezing.   CardiovascularNormal S1,S2.  No m/r/g.   Abdomen: Benign, Soft, non-tender. Renal:  No costovertebral tenderness  GU:  Not performed at this time. Endoc: No evident thyromegaly, no signs of acromegaly. Skin:   warm, no rash. Extremities: normal, no cyanosis, clubbing.   LABORATORY PANEL:   CBC No results for  input(s): WBC, HGB, HCT, PLT in the last 168 hours. ------------------------------------------------------------------------------------------------------------------  Chemistries  No results for input(s): NA, K, CL, CO2, GLUCOSE, BUN, CREATININE, CALCIUM, MG, AST, ALT, ALKPHOS, BILITOT in the last 168 hours.  Invalid input(s): GFRCGP ------------------------------------------------------------------------------------------------------------------  Cardiac Enzymes No results for input(s): TROPONINI in the last 168 hours. ------------------------------------------------------------  RADIOLOGY:   No results found for this or any previous visit. Results for orders placed in visit on 09/25/06  DG Chest 2 View   Narrative * PRIOR REPORT IMPORTED FROM AN EXTERNAL SYSTEM *   PRIOR REPORT IMPORTED FROM THE SYNGO WORKFLOW SYSTEM   REASON FOR EXAM:    htn  COMMENTS:   PROCEDURE:     DXR - DXR CHEST PA (OR AP) AND LATERAL  - Sep 25 2006   9:27AM   RESULT:     Comparison is made to a 17 January 2006.   The lungs are adequately inflated. Slightly increased density is noted at  the LEFT lung base but no discrete infiltrate is seen. The heart and  pulmonary vascularity are within the limits of normal.   IMPRESSION:   I do not see objective evidence of acute cardiopulmonary abnormality.   Thank you for the opportunity to contribute to the care of your patient.       ------------------------------------------------------------------------------------------------------------------  Thank  you for allowing [facility] to assist in the care of your patient. Our recommendations are noted above.  Please contact us if we can be of further service.   Marda Stalker, MD.  [facility] Office Number: [phone]  Patricia Pesa, M.D.  Vilinda Boehringer, M.D.  Merton Border, M.D  07/23/2016

## 2016-07-24 ENCOUNTER — Ambulatory Visit (INDEPENDENT_AMBULATORY_CARE_PROVIDER_SITE_OTHER): Payer: 59 | Admitting: Internal Medicine

## 2016-07-24 VITALS — BP 140/80 | HR 78 | Ht 71.0 in | Wt 236.2 lb

## 2016-07-24 DIAGNOSIS — G4733 Obstructive sleep apnea (adult) (pediatric): Secondary | ICD-10-CM | POA: Diagnosis not present

## 2016-07-24 DIAGNOSIS — G4726 Circadian rhythm sleep disorder, shift work type: Secondary | ICD-10-CM

## 2016-07-24 MED ORDER — RAMELTEON 8 MG PO TABS: 8.0000 mg | ORAL_TABLET | Freq: Every evening | ORAL | 5 refills | Status: DC | PRN

## 2016-07-24 MED ORDER — RAMELTEON 8 MG PO TABS: 8.0000 mg | ORAL_TABLET | Freq: Every day | ORAL | 5 refills | Status: DC

## 2016-07-24 NOTE — Addendum Note (Signed)
Addended by: Maryanna Shape A on: 07/24/2016 11:29 AM   Modules accepted: Orders

## 2016-07-24 NOTE — Patient Instructions (Signed)
--  Can stop ambien, can start rozeram 8 mg qhs for 1 month. If not working in 2-3 weeks call us back for a prescription for sonata.

## 2016-07-28 ENCOUNTER — Encounter: Payer: Self-pay | Admitting: Internal Medicine

## 2016-08-07 DIAGNOSIS — G8929 Other chronic pain: Secondary | ICD-10-CM | POA: Insufficient documentation

## 2016-08-07 DIAGNOSIS — M25561 Pain in right knee: Secondary | ICD-10-CM

## 2016-08-07 DIAGNOSIS — Z96651 Presence of right artificial knee joint: Secondary | ICD-10-CM

## 2016-09-29 ENCOUNTER — Other Ambulatory Visit: Payer: Self-pay | Admitting: *Deleted

## 2016-09-29 MED ORDER — ZOLPIDEM TARTRATE 10 MG PO TABS: 10.0000 mg | ORAL_TABLET | Freq: Every evening | ORAL | 3 refills | Status: DC | PRN

## 2016-12-13 ENCOUNTER — Encounter: Payer: Self-pay | Admitting: Internal Medicine

## 2016-12-13 NOTE — Progress Notes (Signed)
* [facility]     Assessment and Plan:  Obstructive sleep apnea.Perry Jones  -AHI of 11, recommended that he be started on CPAP, but he does not feel that it is a problem at this time, therefore he declined.   Shift work sleep disorder. - see below  Insomnia, due to above.  -He had been doing well with Ambien, will continue   Back pain -Patient has continued back and knee pain which keeps him up at night occasionally, will continue to monitor.    Essential hypertension. -sleep apnea, and insomnia can contribute to high blood pressure, therefore, we will need to monitor.    Date: 12/13/2016  MRN# [id number] Calvert Charland 10-Feb-1951   Tregan Read is a 66 y.o. old male seen in follow up for chief complaint of  Chief Complaint  Patient presents with  . Sleep Apnea     HPI:  The patient is a 66 year old male who has been having trouble with daytime sleepiness, as well as occasional problems with falling asleep. 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 At last visit it was noted that he was having insomnia, therefore he was being managed with [place]. He is known to have OSA, but declined PAP.   He is continuing to take ambien 10 mg qhs around 1030 to 11pm. He notes that he falls asleep within 10 to 15 minutes on most nights. His sleep schedule is much better now and is much less erratic.   sleep study  07/30/2015 showed an AHI of 11.      Medication:   Outpatient Encounter Prescriptions as of 12/18/2016  Medication Sig  . Ascorbic Acid (VITAMIN C) 1000 MG tablet Take by mouth.  . ASTRAGALUS PO Take by mouth.  . benazepril-hydrochlorthiazide (LOTENSIN HCT) 20-12.5 MG tablet Take by mouth.  . Biotin 1000 MCG tablet Take by mouth.  . fluticasone (FLONASE) 50 MCG/ACT nasal spray Place into the nose.  Perry Jones HYDROcodone-acetaminophen (NORCO) 7.5-325 MG tablet Take by mouth.  Perry Jones LORazepam  (ATIVAN) 0.5 MG tablet Take by mouth.  . lovastatin (MEVACOR) 40 MG tablet   . Multiple Vitamin (MULTI-VITAMINS) TABS Take by mouth.  . orphenadrine (NORFLEX) 100 MG tablet Take by mouth.  . ramelteon (ROZEREM) 8 MG tablet Take 1 tablet (8 mg total) by mouth as needed for sleep.  . ramelteon (ROZEREM) 8 MG tablet Take 1 tablet (8 mg total) by mouth at bedtime as needed for sleep.  Perry Jones zolpidem (AMBIEN) 10 MG tablet Take 1 tablet (10 mg total) by mouth at bedtime as needed for sleep.   No facility-administered encounter medications on file as of 12/18/2016.      Allergies:  Prednisone  Review of Systems: Gen:  Denies  fever, sweats. HEENT: Denies blurred vision. Cvc:  No dizziness, chest pain or heaviness Resp:   Denies cough or sputum porduction. Gi: Denies swallowing difficulty, stomach pain. constipation, bowel incontinence Gu:  Denies bladder incontinence, burning urine Ext:   No Joint pain, stiffness. Skin: No skin rash, easy bruising. Endoc:  No polyuria, polydipsia. Psych: No depression, insomnia. Other:  All other systems were reviewed and found to be negative other than what is mentioned in the HPI.   Physical Examination:   VS: BP 134/84 (BP Location: Left Arm, Patient Position: Sitting, Cuff Size: Large)   Pulse 83   Resp 16   Ht 5\' 11"  (1.803 m)   Wt 228  lb (103.4 kg)   SpO2 93%   BMI 31.80 kg/m   General Appearance: No distress  Neuro:without focal findings,  speech normal,  HEENT: PERRLA, EOM intact. Pulmonary: normal breath sounds, No wheezing.   CardiovascularNormal S1,S2.  No m/r/g.   Abdomen: Benign, Soft, non-tender. Renal:  No costovertebral tenderness  GU:  Not performed at this time. Endoc: No evident thyromegaly, no signs of acromegaly. Skin:   warm, no rash. Extremities: normal, no cyanosis, clubbing.   LABORATORY PANEL:   CBC No results for input(s): WBC, HGB, HCT, PLT in the last 168  hours. ------------------------------------------------------------------------------------------------------------------  Chemistries  No results for input(s): NA, K, CL, CO2, GLUCOSE, BUN, CREATININE, CALCIUM, MG, AST, ALT, ALKPHOS, BILITOT in the last 168 hours.  Invalid input(s): GFRCGP ------------------------------------------------------------------------------------------------------------------  Cardiac Enzymes No results for input(s): TROPONINI in the last 168 hours. ------------------------------------------------------------  RADIOLOGY:   No results found for this or any previous visit. Results for orders placed in visit on 09/25/06  DG Chest 2 View   Narrative * PRIOR REPORT IMPORTED FROM AN EXTERNAL SYSTEM *   PRIOR REPORT IMPORTED FROM THE SYNGO WORKFLOW SYSTEM   REASON FOR EXAM:    htn  COMMENTS:   PROCEDURE:     DXR - DXR CHEST PA (OR AP) AND LATERAL  - Sep 25 2006   9:27AM   RESULT:     Comparison is made to a 17 January 2006.   The lungs are adequately inflated. Slightly increased density is noted at  the LEFT lung base but no discrete infiltrate is seen. The heart and  pulmonary vascularity are within the limits of normal.   IMPRESSION:   I do not see objective evidence of acute cardiopulmonary abnormality.   Thank you for the opportunity to contribute to the care of your patient.       ------------------------------------------------------------------------------------------------------------------  Thank  you for allowing [facility] to assist in the care of your patient. Our recommendations are noted above.  Please contact us if we can be of further service.   Marda Stalker, MD.  [facility] Office Number: [phone]  Patricia Pesa, M.D.  Vilinda Boehringer, M.D.  Merton Border, M.D  12/13/2016

## 2016-12-18 ENCOUNTER — Encounter: Payer: Self-pay | Admitting: Internal Medicine

## 2016-12-18 ENCOUNTER — Ambulatory Visit (INDEPENDENT_AMBULATORY_CARE_PROVIDER_SITE_OTHER): Payer: 59 | Admitting: Internal Medicine

## 2016-12-18 VITALS — BP 134/84 | HR 83 | Resp 16 | Ht 71.0 in | Wt 228.0 lb

## 2016-12-18 DIAGNOSIS — G4733 Obstructive sleep apnea (adult) (pediatric): Secondary | ICD-10-CM | POA: Diagnosis not present

## 2016-12-18 DIAGNOSIS — G4726 Circadian rhythm sleep disorder, shift work type: Secondary | ICD-10-CM | POA: Diagnosis not present

## 2016-12-18 DIAGNOSIS — F5101 Primary insomnia: Secondary | ICD-10-CM | POA: Diagnosis not present

## 2016-12-18 NOTE — Patient Instructions (Signed)
Continue ambien nightly.

## 2017-01-26 ENCOUNTER — Encounter: Payer: Self-pay | Admitting: Internal Medicine

## 2017-01-29 ENCOUNTER — Encounter: Payer: Self-pay | Admitting: Internal Medicine

## 2017-01-30 ENCOUNTER — Telehealth: Payer: Self-pay | Admitting: Internal Medicine

## 2017-01-30 NOTE — Telephone Encounter (Signed)
Can give him a sample of REM Fresh, if not available or he does not respond, we can start him on Sonata 10 mg qhs one month with 11 refills.   ----- Message -----  From: Renelda Mom, LPN  Sent: 12/26/4285  9:35 AM  To: Laverle Hobby, MD  Subject: FW: Non-Urgent Medical Question           Please advise on message below. Pt not due for f/u until 11/2017.    Thanks, [employer] with pt and informed him of DR response. Pt will pick up samples and call us back at later time to let us know if they are helping. Nothing further needed.

## 2017-01-30 NOTE — Telephone Encounter (Signed)
Pt states the Ambien hasn't worked for him, he hasnt slept in 1 week. Would like something different called in.

## 2017-02-01 ENCOUNTER — Telehealth: Payer: Self-pay | Admitting: Internal Medicine

## 2017-02-01 ENCOUNTER — Encounter: Payer: Self-pay | Admitting: Internal Medicine

## 2017-02-01 NOTE — Telephone Encounter (Signed)
Pt picked up Remfresh on Tuesday. He is calling back this am stating it didn't help and would like something different. See message below and please advise.

## 2017-02-01 NOTE — Telephone Encounter (Signed)
Pt calling stating the melatonin is not working Would like something to be called in that is not the Ambien  To [facility].

## 2017-02-02 MED ORDER — ZALEPLON 10 MG PO CAPS: 10.0000 mg | ORAL_CAPSULE | Freq: Every evening | ORAL | 5 refills | Status: DC | PRN

## 2017-02-02 NOTE — Telephone Encounter (Signed)
Can try Sonata 10 mg qhs one month with 11 refills, though I am worried it might not be covered by his insurance.

## 2017-02-02 NOTE — Telephone Encounter (Signed)
RX for [employer] called into [facility]. Pt informed RX has been sent. Nothing further needed.

## 2017-02-05 ENCOUNTER — Encounter: Payer: Self-pay | Admitting: Internal Medicine

## 2017-02-13 NOTE — Progress Notes (Signed)
* [facility]     Assessment and Plan:  Obstructive sleep apnea.Marland Kitchen  -AHI of 11, recommended that he be started on CPAP, but he does not feel that it is a problem at this time, therefore he declined.   Shift work sleep disorder. - see below  Insomnia, due to above.  -Ambien had stopped working; but after being off for 10 days, it started to work again.    Back pain -Patient has continued back and knee pain which keeps him up at night occasionally, will continue to monitor.    Essential hypertension. -sleep apnea, and insomnia can contribute to high blood pressure, therefore, we will need to monitor.    Date: 02/13/2017  MRN# [id number] Perry Jones [birth year]   Sebastiano Luecke Barich is a 66 y.o. old male seen in follow up for chief complaint of  Chief Complaint  Patient presents with  . Follow-up    pt states Ambien is effective. waking feeling well rested. no new concerns today.       HPI:  The patient is a 66 year old male who has been having trouble with daytime sleepiness, as well as occasional problems with falling asleep. 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 At last visit it was noted that he was having insomnia, therefore he was being managed with [place]. He is known to have OSA, but declined PAP. He notes that recently Ambien is no longer working. He has to take 15 mg or more in order for him to fall asleep. He was tried on a sample of REM fresh with no significant improvement, then tried on sonata which did not help.   He then went back to [place] again after 10 days, and it is now working well, he can not get by with half a pill on on 2 out of 10 days, then will have to take the other half.    sleep study  07/30/2015 showed an AHI of 11.      Medication:   Outpatient Encounter Prescriptions as of 02/19/2017  Medication Sig  . Ascorbic Acid (VITAMIN C) 1000 MG tablet  Take by mouth.  . ASTRAGALUS PO Take by mouth.  . benazepril-hydrochlorthiazide (LOTENSIN HCT) 20-12.5 MG tablet Take by mouth.  . Biotin 1000 MCG tablet Take by mouth.  . celecoxib (CELEBREX) 100 MG capsule Take 100 mg by mouth daily as needed.  . cyclobenzaprine (FLEXERIL) 10 MG tablet Take 10 mg by mouth daily as needed.  . fluticasone (FLONASE) 50 MCG/ACT nasal spray Place into the nose.  Marland Kitchen HYDROcodone-acetaminophen (NORCO) 7.5-325 MG tablet Take by mouth.  . Loratadine 10 MG CAPS Take 10 mg by mouth daily.  Marland Kitchen LORazepam (ATIVAN) 0.5 MG tablet Take by mouth.  . lovastatin (MEVACOR) 40 MG tablet   . Multiple Vitamin (MULTI-VITAMINS) TABS Take by mouth.  . orphenadrine (NORFLEX) 100 MG tablet Take by mouth.  Marland Kitchen PENNSAID 2 % SOLN Apply 2 application topically 2 (two) times daily.  . zaleplon (SONATA) 10 MG capsule Take 1 capsule (10 mg total) by mouth at bedtime as needed for sleep.  Marland Kitchen zolpidem (AMBIEN) 10 MG tablet Take 1 tablet (10 mg total) by mouth at bedtime as needed for sleep.   No facility-administered encounter medications on file as of 02/19/2017.      Allergies:  Prednisone  Review of Systems: Gen:  Denies  fever, sweats. HEENT: Denies blurred vision. Cvc:  No  dizziness, chest pain or heaviness Resp:   Denies cough or sputum porduction. Gi: Denies swallowing difficulty, stomach pain. constipation, bowel incontinence Gu:  Denies bladder incontinence, burning urine Ext:   No Joint pain, stiffness. Skin: No skin rash, easy bruising. Endoc:  No polyuria, polydipsia. Psych: No depression, insomnia. Other:  All other systems were reviewed and found to be negative other than what is mentioned in the HPI.   Physical Examination:   VS: There were no vitals taken for this visit.  General Appearance: No distress  Neuro:without focal findings,  speech normal,  HEENT: PERRLA, EOM intact. Pulmonary: normal breath sounds, No wheezing.   CardiovascularNormal S1,S2.  No m/r/g.     Abdomen: Benign, Soft, non-tender. Renal:  No costovertebral tenderness  GU:  Not performed at this time. Endoc: No evident thyromegaly, no signs of acromegaly. Skin:   warm, no rash. Extremities: normal, no cyanosis, clubbing.   LABORATORY PANEL:   CBC No results for input(s): WBC, HGB, HCT, PLT in the last 168 hours. ------------------------------------------------------------------------------------------------------------------  Chemistries  No results for input(s): NA, K, CL, CO2, GLUCOSE, BUN, CREATININE, CALCIUM, MG, AST, ALT, ALKPHOS, BILITOT in the last 168 hours.  Invalid input(s): GFRCGP ------------------------------------------------------------------------------------------------------------------  Cardiac Enzymes No results for input(s): TROPONINI in the last 168 hours. ------------------------------------------------------------  RADIOLOGY:   No results found for this or any previous visit. Results for orders placed in visit on 09/25/06  DG Chest 2 View   Narrative * PRIOR REPORT IMPORTED FROM AN EXTERNAL SYSTEM *   PRIOR REPORT IMPORTED FROM THE [facility] WORKFLOW SYSTEM   REASON FOR EXAM:    htn  COMMENTS:   PROCEDURE:     DXR - DXR CHEST PA (OR AP) AND LATERAL  - Sep 25 2006   9:27AM   RESULT:     Comparison is made to a 17 January 2006.   The lungs are adequately inflated. Slightly increased density is noted at  the LEFT lung base but no discrete infiltrate is seen. The heart and  pulmonary vascularity are within the limits of normal.   IMPRESSION:   I do not see objective evidence of acute cardiopulmonary abnormality.   Thank you for the opportunity to contribute to the care of your patient.       ------------------------------------------------------------------------------------------------------------------  Thank  you for allowing [facility] to assist in the care of your patient. Our recommendations are noted above.   Please contact us if we can be of further service.   Marda Stalker, MD.  [facility] Office Number: [phone]  Patricia Pesa, M.D.  Vilinda Boehringer, M.D.  Merton Border, M.D  02/13/2017

## 2017-02-19 ENCOUNTER — Encounter: Payer: Self-pay | Admitting: Internal Medicine

## 2017-02-19 ENCOUNTER — Ambulatory Visit (INDEPENDENT_AMBULATORY_CARE_PROVIDER_SITE_OTHER): Payer: 59 | Admitting: Internal Medicine

## 2017-02-19 VITALS — BP 132/82 | HR 80 | Ht 70.0 in | Wt 234.6 lb

## 2017-02-19 DIAGNOSIS — F5101 Primary insomnia: Secondary | ICD-10-CM

## 2017-02-19 NOTE — Patient Instructions (Signed)
--  Continue ambien as needed.

## 2017-04-16 ENCOUNTER — Other Ambulatory Visit: Payer: Self-pay | Admitting: Internal Medicine

## 2017-04-16 MED ORDER — ZOLPIDEM TARTRATE 10 MG PO TABS: 10.0000 mg | ORAL_TABLET | Freq: Every evening | ORAL | 3 refills | Status: DC | PRN

## 2017-04-16 NOTE — Telephone Encounter (Signed)
Rx request sent in from pharmacy.

## 2017-08-05 ENCOUNTER — Other Ambulatory Visit (INDEPENDENT_AMBULATORY_CARE_PROVIDER_SITE_OTHER): Payer: Self-pay

## 2017-08-06 ENCOUNTER — Telehealth: Payer: Self-pay | Admitting: Internal Medicine

## 2017-08-06 MED ORDER — ZOLPIDEM TARTRATE 10 MG PO TABS: 10.0000 mg | ORAL_TABLET | Freq: Every evening | ORAL | 5 refills | Status: DC | PRN

## 2017-08-06 NOTE — Telephone Encounter (Signed)
Printed, signed and faxed. Nothing further needed.

## 2017-08-06 NOTE — Telephone Encounter (Signed)
°*  STAT* If patient is at the pharmacy, call can be transferred to refill team.   1. Which medications need to be refilled? (please list name of each medication and dose if known) zolpidem (AMBIEN) 10 MG  2. Which pharmacy/location (including street and city if local pharmacy) is medication to be sent to? [facility]  3. Do they need a 30 day or 90 day supply? 90 day   States the pharmacy has sent a request but they have not heard anything

## 2017-09-27 ENCOUNTER — Ambulatory Visit: Admission: RE | Admit: 2017-09-27 | Payer: 59 | Source: Ambulatory Visit | Admitting: Gastroenterology

## 2017-09-27 DIAGNOSIS — J302 Other seasonal allergic rhinitis: Secondary | ICD-10-CM | POA: Insufficient documentation

## 2017-10-16 DIAGNOSIS — R202 Paresthesia of skin: Secondary | ICD-10-CM | POA: Insufficient documentation

## 2017-10-16 DIAGNOSIS — M79604 Pain in right leg: Secondary | ICD-10-CM | POA: Insufficient documentation

## 2017-11-22 ENCOUNTER — Encounter: Payer: Self-pay | Admitting: Anesthesiology

## 2017-11-22 ENCOUNTER — Encounter: Admission: RE | Payer: Self-pay | Source: Ambulatory Visit

## 2017-11-22 ENCOUNTER — Encounter
Admission: RE | Disposition: A | Discharge status: Home / Self Care | Payer: Self-pay | Source: Ambulatory Visit | Attending: Gastroenterology

## 2017-11-22 ENCOUNTER — Ambulatory Visit
Admission: RE | Admit: 2017-11-22 | Discharge: 2017-11-22 | Disposition: A | Discharge status: Home / Self Care | Payer: 59 | Source: Ambulatory Visit | Attending: Gastroenterology | Admitting: Gastroenterology

## 2017-11-22 ENCOUNTER — Ambulatory Visit: Payer: 59 | Admitting: Anesthesiology

## 2017-11-22 DIAGNOSIS — Z8719 Personal history of other diseases of the digestive system: Secondary | ICD-10-CM | POA: Insufficient documentation

## 2017-11-22 DIAGNOSIS — D123 Benign neoplasm of transverse colon: Secondary | ICD-10-CM | POA: Diagnosis not present

## 2017-11-22 DIAGNOSIS — K5730 Diverticulosis of large intestine without perforation or abscess without bleeding: Secondary | ICD-10-CM | POA: Insufficient documentation

## 2017-11-22 DIAGNOSIS — Z87891 Personal history of nicotine dependence: Secondary | ICD-10-CM | POA: Insufficient documentation

## 2017-11-22 DIAGNOSIS — Z888 Allergy status to other drugs, medicaments and biological substances status: Secondary | ICD-10-CM | POA: Insufficient documentation

## 2017-11-22 DIAGNOSIS — I10 Essential (primary) hypertension: Secondary | ICD-10-CM | POA: Diagnosis not present

## 2017-11-22 DIAGNOSIS — M1990 Unspecified osteoarthritis, unspecified site: Secondary | ICD-10-CM | POA: Insufficient documentation

## 2017-11-22 DIAGNOSIS — E7800 Pure hypercholesterolemia, unspecified: Secondary | ICD-10-CM | POA: Diagnosis not present

## 2017-11-22 DIAGNOSIS — Z79891 Long term (current) use of opiate analgesic: Secondary | ICD-10-CM | POA: Insufficient documentation

## 2017-11-22 DIAGNOSIS — Z7951 Long term (current) use of inhaled steroids: Secondary | ICD-10-CM | POA: Diagnosis not present

## 2017-11-22 DIAGNOSIS — Z1211 Encounter for screening for malignant neoplasm of colon: Secondary | ICD-10-CM | POA: Diagnosis present

## 2017-11-22 DIAGNOSIS — K640 First degree hemorrhoids: Secondary | ICD-10-CM | POA: Insufficient documentation

## 2017-11-22 DIAGNOSIS — Z79899 Other long term (current) drug therapy: Secondary | ICD-10-CM | POA: Diagnosis not present

## 2017-11-22 DIAGNOSIS — K635 Polyp of colon: Secondary | ICD-10-CM | POA: Insufficient documentation

## 2017-11-22 DIAGNOSIS — D122 Benign neoplasm of ascending colon: Secondary | ICD-10-CM | POA: Diagnosis not present

## 2017-11-22 HISTORY — PX: COLONOSCOPY WITH PROPOFOL: SHX5780

## 2017-11-22 SURGERY — COLONOSCOPY WITH PROPOFOL
Anesthesia: General

## 2017-11-22 MED ORDER — SODIUM CHLORIDE 0.9 % IV SOLN
INTRAVENOUS | Status: DC
  Administered 2017-11-22: 12:00:00 via INTRAVENOUS

## 2017-11-22 MED ORDER — PROPOFOL 10 MG/ML IV BOLUS
INTRAVENOUS | Status: AC
  Filled 2017-11-22: qty 20

## 2017-11-22 MED ORDER — MIDAZOLAM HCL 2 MG/2ML IJ SOLN
INTRAMUSCULAR | Status: AC
  Filled 2017-11-22: qty 2

## 2017-11-22 MED ORDER — MIDAZOLAM HCL 2 MG/2ML IJ SOLN
INTRAMUSCULAR | Status: DC | PRN
  Administered 2017-11-22: 2 mg via INTRAVENOUS

## 2017-11-22 MED ORDER — EPHEDRINE SULFATE 50 MG/ML IJ SOLN
INTRAMUSCULAR | Status: AC
  Filled 2017-11-22: qty 1

## 2017-11-22 MED ORDER — SODIUM CHLORIDE 0.9 % IV SOLN
INTRAVENOUS | Status: DC
  Administered 2017-11-22: 13:00:00 via INTRAVENOUS

## 2017-11-22 MED ORDER — PROPOFOL 500 MG/50ML IV EMUL
INTRAVENOUS | Status: DC | PRN
  Administered 2017-11-22: 120 ug/kg/min via INTRAVENOUS

## 2017-11-22 MED ORDER — PROPOFOL 500 MG/50ML IV EMUL
INTRAVENOUS | Status: AC
  Filled 2017-11-22: qty 50

## 2017-11-22 MED ORDER — FENTANYL CITRATE (PF) 100 MCG/2ML IJ SOLN
INTRAMUSCULAR | Status: AC
  Filled 2017-11-22: qty 2

## 2017-11-22 MED ORDER — EPHEDRINE SULFATE 50 MG/ML IJ SOLN
INTRAMUSCULAR | Status: DC | PRN
  Administered 2017-11-22 (×4): 10 mg via INTRAVENOUS

## 2017-11-22 MED ORDER — FENTANYL CITRATE (PF) 100 MCG/2ML IJ SOLN
INTRAMUSCULAR | Status: DC | PRN
  Administered 2017-11-22: 50 ug via INTRAVENOUS
  Administered 2017-11-22 (×2): 25 ug via INTRAVENOUS

## 2017-11-22 NOTE — H&P (Signed)
Outpatient short stay form Pre-procedure 11/22/2017 12:25 PM Lollie Sails MD  Primary Physician: Dr. Juluis Pitch  Reason for visit: Colonoscopy  History of present illness: Patient is a 67 year old male presenting today as above.  His last colonoscopy was 04/19/2007.  At that time there were 3 small polyps removed.  These were all consistent with hyperplastic polyps.  He tolerated his prep last night well.  He takes no aspirin or blood thinning agent.    Current Facility-Administered Medications:  .  0.9 %  sodium chloride infusion, , Intravenous, Continuous, Lollie Sails, MD .  0.9 %  sodium chloride infusion, , Intravenous, Continuous, Lollie Sails, MD, Last Rate: 20 mL/hr at 11/22/17 1204  Medications Prior to Admission  Medication Sig Dispense Refill Last Dose  . benazepril-hydrochlorthiazide (LOTENSIN HCT) 20-12.5 MG tablet Take by mouth.   11/22/2017 at 0700  . Biotin 1000 MCG tablet Take by mouth.   11/22/2017 at 0700  . cyclobenzaprine (FLEXERIL) 10 MG tablet Take 10 mg by mouth daily as needed.   11/21/2017 at Unknown time  . HYDROcodone-acetaminophen (NORCO) 7.5-325 MG tablet Take by mouth.   11/21/2017 at Unknown time  . LORazepam (ATIVAN) 0.5 MG tablet Take by mouth.   11/21/2017 at Unknown time  . lovastatin (MEVACOR) 40 MG tablet    11/22/2017 at 0700  . Multiple Vitamin (MULTI-VITAMINS) TABS Take by mouth.   Past Week at Unknown time  . orphenadrine (NORFLEX) 100 MG tablet Take by mouth.   11/21/2017 at Unknown time  . Ascorbic Acid (VITAMIN C) 1000 MG tablet Take by mouth.   Not Taking at Unknown time  . ASTRAGALUS PO Take by mouth.   Taking  . fluticasone (FLONASE) 50 MCG/ACT nasal spray Place into the nose.   Not Taking at Unknown time  . Loratadine 10 MG CAPS Take 10 mg by mouth daily.   Not Taking at Unknown time  . ramelteon (ROZEREM) 8 MG tablet Take 8 mg by mouth at bedtime.   Not Taking at Unknown time  . zolpidem (AMBIEN) 10 MG tablet Take 1 tablet  (10 mg total) by mouth at bedtime as needed for up to 24 days for sleep. 30 tablet 5      Allergies  Allergen Reactions  . Prednisone Other (See Comments)    BLURRED VISION     Past Medical History:  Diagnosis Date  . High cholesterol   . Hypertension   . Sinus trouble     Review of systems:      Physical Exam    Heart and lungs: Without rub or gallop, lungs are bilaterally clear.    HEENT: Normal cephalic atraumatic eyes are anicteric    Other:    Pertinant exam for procedure: Soft nontender nondistended bowel sounds positive normoactive    Planned proceedures: Colonoscopy and indicated procedures. I have discussed the risks benefits and complications of procedures to include not limited to bleeding, infection, perforation and the risk of sedation and the patient wishes to proceed.    Lollie Sails, MD Gastroenterology 11/22/2017  12:25 PM

## 2017-11-22 NOTE — Anesthesia Preprocedure Evaluation (Signed)
Anesthesia Evaluation  Patient identified by MRN, date of birth, ID band Patient awake    Reviewed: Allergy & Precautions, NPO status , Patient's Chart, lab work & pertinent test results  Airway Mallampati: II  TM Distance: >3 FB     Dental   Pulmonary former smoker,    Pulmonary exam normal        Cardiovascular hypertension, Normal cardiovascular exam     Neuro/Psych  Neuromuscular disease negative psych ROS   GI/Hepatic negative GI ROS, Neg liver ROS,   Endo/Other  negative endocrine ROS  Renal/GU negative Renal ROS  negative genitourinary   Musculoskeletal  (+) Arthritis , Osteoarthritis,    Abdominal Normal abdominal exam  (+)   Peds negative pediatric ROS (+)  Hematology negative hematology ROS (+)   Anesthesia Other Findings   Reproductive/Obstetrics                             Anesthesia Physical Anesthesia Plan  ASA: II  Anesthesia Plan: General   Post-op Pain Management:    Induction: Intravenous  PONV Risk Score and Plan: TIVA  Airway Management Planned: Nasal Cannula  Additional Equipment:   Intra-op Plan:   Post-operative Plan:   Informed Consent: I have reviewed the patients History and Physical, chart, labs and discussed the procedure including the risks, benefits and alternatives for the proposed anesthesia with the patient or authorized representative who has indicated his/her understanding and acceptance.   Dental advisory given  Plan Discussed with: CRNA and Surgeon  Anesthesia Plan Comments:         Anesthesia Quick Evaluation

## 2017-11-22 NOTE — Anesthesia Procedure Notes (Signed)
Performed by: Cook-Martin, Allicia Culley Pre-anesthesia Checklist: Patient identified, Emergency Drugs available, Suction available, Patient being monitored and Timeout performed Patient Re-evaluated:Patient Re-evaluated prior to induction Oxygen Delivery Method: Simple face mask Preoxygenation: Pre-oxygenation with 100% oxygen Induction Type: IV induction Ventilation: Oral airway inserted - appropriate to patient size Placement Confirmation: positive ETCO2 and CO2 detector       

## 2017-11-22 NOTE — Anesthesia Postprocedure Evaluation (Signed)
Anesthesia Post Note  Patient: Perry Jones  Procedure(s) Performed: COLONOSCOPY WITH PROPOFOL (N/A )  Anesthesia Type: General Level of consciousness: awake and alert and oriented Pain management: pain level controlled Vital Signs Assessment: post-procedure vital signs reviewed and stable Respiratory status: spontaneous breathing Cardiovascular status: blood pressure returned to baseline Anesthetic complications: no     Last Vitals:  Vitals:   11/22/17 1358 11/22/17 1408  BP: 112/67 121/72  Pulse: 84 75  Resp: (!) 23 17  Temp:    SpO2: 98% 99%    Last Pain:  Vitals:   11/22/17 1408  TempSrc:   PainSc: 0-No pain                 Hana Trippett

## 2017-11-22 NOTE — Anesthesia Post-op Follow-up Note (Signed)
Anesthesia QCDR form completed.        

## 2017-11-22 NOTE — Op Note (Signed)
[facility] Patient Name: Perry Jones Procedure Date: 11/22/2017 12:05 PM MRN: [id number] Account #: [id number] Date of Birth: 1951-06-14 Admit Type: Outpatient Age: 67 Room: [facility] ENDO ROOM 1 Gender: Male Note Status: Finalized Procedure:            Colonoscopy Indications:          Screening for colorectal malignant neoplasm Providers:            Lollie Sails, MD Referring MD:         Youlanda Roys. Lovie Macadamia, MD (Referring MD) Medicines:            Monitored Anesthesia Care Complications:        No immediate complications. Procedure:            Pre-Anesthesia Assessment:                       - ASA Grade Assessment: II - A patient with mild                        systemic disease.                       After obtaining informed consent, the colonoscope was                        passed under direct vision. Throughout the procedure,                        the patient's blood pressure, pulse, and oxygen                        saturations were monitored continuously. The                        Colonoscope was introduced through the anus and                        advanced to the the cecum, identified by appendiceal                        orifice and ileocecal valve. The colonoscopy was                        performed with difficulty due to poor bowel prep.                        Successful completion of the procedure was aided by                        lavage. The quality of the bowel preparation was fair                        except the cecum/ascending colon was poor. Findings:      Multiple small-mouthed diverticula were found in the sigmoid colon and       descending colon.      A 5 mm polyp was found in the distal transverse colon. The polyp was       sessile. Polypectomy was attempted, initially using a cold snare. Polyp       resection was incomplete with this device. This intervention then  required a different device and polypectomy  technique. The polyp was       removed with a cold biopsy forceps. Resection and retrieval were       complete.      A 5 mm polyp was found in the ascending colon. The polyp was sessile.       The polyp was removed with a cold snare. Resection was complete, but the       polyp tissue was possibly not retrieved, versus shattered in to very       small pieces in retrieval, sample sent to pathology.      Two sessile polyps were found in the distal sigmoid colon. The polyps       were 1 to 2 mm in size. These polyps were removed with a cold biopsy       forceps. Resection and retrieval were complete.      The retroflexed view of the distal rectum and anal verge was normal and       showed no anal or rectal abnormalities.      Non-bleeding internal hemorrhoids were found during anoscopy. The       hemorrhoids were small and Grade I (internal hemorrhoids that do not       prolapse).      The digital rectal exam was normal. Impression:           - Diverticulosis in the sigmoid colon and in the                        descending colon.                       - One 5 mm polyp in the distal transverse colon,                        removed with a cold biopsy forceps. Resected and                        retrieved.                       - One 5 mm polyp in the ascending colon, removed with a                        cold snare. Complete resection. Polyp tissue not                        retrieved.                       - Two 1 to 2 mm polyps in the distal sigmoid colon,                        removed with a cold biopsy forceps. Resected and                        retrieved.                       - The distal rectum and anal verge are normal on                        retroflexion view.                       -  Non-bleeding internal hemorrhoids. Recommendation:       - Discharge patient to home.                       - Soft diet today, then advance as tolerated to advance                        diet as  tolerated. Procedure Code(s):    --- Professional ---                       [phone], Colonoscopy, flexible; with removal of tumor(s),                        polyp(s), or other lesion(s) by snare technique                       45380, 69, Colonoscopy, flexible; with biopsy, single                        or multiple Diagnosis Code(s):    --- Professional ---                       Z12.11, Encounter for screening for malignant neoplasm                        of colon                       K64.0, First degree hemorrhoids                       D12.3, Benign neoplasm of transverse colon (hepatic                        flexure or splenic flexure)                       D12.2, Benign neoplasm of ascending colon                       D12.5, Benign neoplasm of sigmoid colon                       K57.30, Diverticulosis of large intestine without                        perforation or abscess without bleeding CPT copyright 2017 American [employer] Association. All rights reserved. The codes documented in this report are preliminary and upon coder review may  be revised to meet current compliance requirements. Lollie Sails, MD 11/22/2017 1:54:41 PM This report has been signed electronically. Number of Addenda: 0 Note Initiated On: 11/22/2017 12:05 PM Scope Withdrawal Time: 0 hours 10 minutes 48 seconds  Total Procedure Duration: 0 hours 42 minutes 10 seconds       [facility]

## 2017-11-22 NOTE — Transfer of Care (Signed)
Immediate Anesthesia Transfer of Care Note  Patient: Perry Jones  Procedure(s) Performed: COLONOSCOPY WITH PROPOFOL (N/A )  Patient Location: PACU  Anesthesia Type:general  Level of Consciousness: awake and sedated  Airway & Oxygen Therapy: Patient Spontanous Breathing and Patient connected to face mask oxygen  Post-op Assessment: Report given to RN and Post -op Vital signs reviewed and stable  Post vital signs: Reviewed and stable  Last Vitals:  Vitals Value Taken Time  BP    Temp    Pulse    Resp    SpO2      Last Pain:  Vitals:   11/22/17 1148  TempSrc: Tympanic  PainSc: 0-No pain         Complications: No apparent anesthesia complications

## 2017-11-26 LAB — SURGICAL PATHOLOGY

## 2018-01-02 NOTE — Progress Notes (Signed)
* [facility]     Assessment and Plan:  Obstructive sleep apnea.Marland Kitchen  -AHI of 11, recommended that he be started on CPAP, but he does not feel that it is a problem at this time, therefore he declined.   Shift work sleep disorder. - see below  Insomnia, due to above.  -Ambien will be continued at 10 mg daily.    Back pain -Patient has continued back and knee pain which keeps him up at night occasionally, will continue to monitor.    Essential hypertension. -sleep apnea, and insomnia can contribute to high blood pressure, therefore, we will need to monitor.    Date: 01/02/2018  MRN# [id number] Perry Jones 07-07-1950   Perry Jones is a 67 y.o. old male seen in follow up for chief complaint of  Chief Complaint  Patient presents with  . Follow-up    sleeping better: needs Ambien refill     HPI:  The patient is a 67 year old male who has been having trouble with daytime sleepiness, as well as occasional problems with falling asleep. 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 He is known to have OSA, but declined PAP.He was tried on a sample of REM fresh with no significant improvement, then tried on sonata which did not help.  He continues to take ambien 10 qhs, sometimes 5 mg if he does not have to work the next day. The ambien overall seems to work well for him. He he has not had side effects from it. No sleep walking or sleep eating.  He continues to work part time, he is back to morning shifts which helps working for [employer] support.   sleep study  07/30/2015>>AHI of 11.      Medication:   Outpatient Encounter Medications as of 01/04/2018  Medication Sig  . Ascorbic Acid (VITAMIN C) 1000 MG tablet Take by mouth.  . ASTRAGALUS PO Take by mouth.  . benazepril-hydrochlorthiazide (LOTENSIN HCT) 20-12.5 MG tablet  Take by mouth.  . Biotin 1000 MCG tablet Take by mouth.  . cyclobenzaprine (FLEXERIL) 10 MG tablet Take 10 mg by mouth daily as needed.  . fluticasone (FLONASE) 50 MCG/ACT nasal spray Place into the nose.  Marland Kitchen HYDROcodone-acetaminophen (NORCO) 7.5-325 MG tablet Take by mouth.  . Loratadine 10 MG CAPS Take 10 mg by mouth daily.  Marland Kitchen LORazepam (ATIVAN) 0.5 MG tablet Take by mouth.  . lovastatin (MEVACOR) 40 MG tablet   . Multiple Vitamin (MULTI-VITAMINS) TABS Take by mouth.  . orphenadrine (NORFLEX) 100 MG tablet Take by mouth.  . ramelteon (ROZEREM) 8 MG tablet Take 8 mg by mouth at bedtime.  Marland Kitchen zolpidem (AMBIEN) 10 MG tablet Take 1 tablet (10 mg total) by mouth at bedtime as needed for up to 24 days for sleep.   No facility-administered encounter medications on file as of 01/04/2018.      Allergies:  Prednisone  Review of Systems: Gen:  Denies  fever, sweats. HEENT: Denies blurred vision. Cvc:  No dizziness, chest pain or heaviness Resp:   Denies cough or sputum porduction. Gi: Denies swallowing difficulty, stomach pain. constipation, bowel incontinence Gu:  Denies bladder incontinence, burning urine Ext:   No Joint pain, stiffness. Skin: No skin rash, easy bruising. Endoc:  No polyuria, polydipsia. Psych: No depression, insomnia. Other:  All other systems were reviewed and found to be negative other  than what is mentioned in the HPI.   Physical Examination:   VS: BP 126/80 (BP Location: Left Arm, Cuff Size: Normal)   Pulse 69   Ht 5\' 10"  (1.778 m)   Wt 241 lb (109.3 kg)   SpO2 95%   BMI 34.58 kg/m   General Appearance: No distress  Neuro:without focal findings,  speech normal,  HEENT: PERRLA, EOM intact. Pulmonary: normal breath sounds, No wheezing.   CardiovascularNormal S1,S2.  No m/r/g.   Abdomen: Benign, Soft, non-tender. Renal:  No costovertebral tenderness  GU:  Not performed at this time. Endoc: No evident thyromegaly, no signs of acromegaly. Skin:   warm, no  rash. Extremities: normal, no cyanosis, clubbing.   LABORATORY PANEL:   CBC No results for input(s): WBC, HGB, HCT, PLT in the last 168 hours. ------------------------------------------------------------------------------------------------------------------  Chemistries  No results for input(s): NA, K, CL, CO2, GLUCOSE, BUN, CREATININE, CALCIUM, MG, AST, ALT, ALKPHOS, BILITOT in the last 168 hours.  Invalid input(s): GFRCGP ------------------------------------------------------------------------------------------------------------------  Cardiac Enzymes No results for input(s): TROPONINI in the last 168 hours. ------------------------------------------------------------  RADIOLOGY:   No results found for this or any previous visit. Results for orders placed in visit on 09/25/06  DG Chest 2 View   Narrative * PRIOR REPORT IMPORTED FROM AN EXTERNAL SYSTEM *   PRIOR REPORT IMPORTED FROM THE [facility] WORKFLOW SYSTEM   REASON FOR EXAM:    htn  COMMENTS:   PROCEDURE:     DXR - DXR CHEST PA (OR AP) AND LATERAL  - Sep 25 2006   9:27AM   RESULT:     Comparison is made to a 17 January 2006.   The lungs are adequately inflated. Slightly increased density is noted at  the LEFT lung base but no discrete infiltrate is seen. The heart and  pulmonary vascularity are within the limits of normal.   IMPRESSION:   I do not see objective evidence of acute cardiopulmonary abnormality.   Thank you for the opportunity to contribute to the care of your patient.       ------------------------------------------------------------------------------------------------------------------  Thank  you for allowing [facility] to assist in the care of your patient. Our recommendations are noted above.  Please contact us if we can be of further service.   Marda Stalker, M.D., F.C.C.P.  Board Certified in Internal Medicine, Pulmonary Medicine, [place], and  Sleep Medicine.  [facility] Office Number: [phone]  01/02/2018

## 2018-01-04 ENCOUNTER — Ambulatory Visit: Payer: 59 | Admitting: Internal Medicine

## 2018-01-04 ENCOUNTER — Encounter: Payer: Self-pay | Admitting: Internal Medicine

## 2018-01-04 VITALS — BP 126/80 | HR 69 | Ht 70.0 in | Wt 241.0 lb

## 2018-01-04 DIAGNOSIS — G4726 Circadian rhythm sleep disorder, shift work type: Secondary | ICD-10-CM

## 2018-01-04 DIAGNOSIS — G4733 Obstructive sleep apnea (adult) (pediatric): Secondary | ICD-10-CM | POA: Diagnosis not present

## 2018-01-04 DIAGNOSIS — F5101 Primary insomnia: Secondary | ICD-10-CM | POA: Diagnosis not present

## 2018-01-04 NOTE — Patient Instructions (Signed)
Continue ambien nightly as needed.

## 2018-01-17 ENCOUNTER — Other Ambulatory Visit: Payer: Self-pay | Admitting: *Deleted

## 2018-01-17 MED ORDER — ZOLPIDEM TARTRATE 10 MG PO TABS: 10.0000 mg | ORAL_TABLET | Freq: Every evening | ORAL | 5 refills | Status: DC | PRN

## 2018-06-12 ENCOUNTER — Other Ambulatory Visit: Payer: Self-pay | Admitting: Internal Medicine

## 2018-06-12 MED ORDER — ZOLPIDEM TARTRATE 10 MG PO TABS: 10.0000 mg | ORAL_TABLET | Freq: Every evening | ORAL | 5 refills | Status: DC | PRN

## 2018-06-12 NOTE — Telephone Encounter (Signed)
rx printed and placed in MD folder for completion. Returned call and notified patient that his insurance has denied rx. He was advised to call insurance to see if they will still cover. Also notified patient Dr. Is out of office until after the 1st of year. Nothing further needed.

## 2018-06-12 NOTE — Telephone Encounter (Signed)
Called pharmacy and approved refills on Zolpidem. Nothing further needed.

## 2018-06-12 NOTE — Telephone Encounter (Signed)
Left detailed message per earlier conversation with the patient Dr. Ashby Dawes is out of the office until 07/02/18. Message will be forwarded to Dr.Ram in regards to calling insurance company. If patient would like to pay out of pocket for rx until insurance will pay for medication, he is to call office back. We will see if we can call and give a verbal refill authorization at that time. Nothing further needed.

## 2018-06-12 NOTE — Telephone Encounter (Signed)
Pt called back and stated that he is ok with paying out of pocket and asked that you go ahead and fill RX.

## 2018-06-21 NOTE — Telephone Encounter (Signed)
Patient received denial from [facility] in regards to Zolpidem Tartrate. I spoke to Neoma Laming at [facility] [phone] who stated that the rx must be run as "Ambien" brand name and would be subject to $20 copay. Spoke to patient, he is aware and will let us know if future problems arise.

## 2018-07-14 ENCOUNTER — Emergency Department
Admission: EM | Admit: 2018-07-14 | Discharge: 2018-07-15 | Disposition: A | Discharge status: Home / Self Care | Payer: Medicare HMO | Attending: Emergency Medicine | Admitting: Emergency Medicine

## 2018-07-14 ENCOUNTER — Emergency Department: Payer: Medicare HMO

## 2018-07-14 DIAGNOSIS — A09 Infectious gastroenteritis and colitis, unspecified: Secondary | ICD-10-CM | POA: Insufficient documentation

## 2018-07-14 DIAGNOSIS — Z79899 Other long term (current) drug therapy: Secondary | ICD-10-CM | POA: Diagnosis not present

## 2018-07-14 DIAGNOSIS — Z87891 Personal history of nicotine dependence: Secondary | ICD-10-CM | POA: Insufficient documentation

## 2018-07-14 DIAGNOSIS — R1030 Lower abdominal pain, unspecified: Secondary | ICD-10-CM | POA: Diagnosis present

## 2018-07-14 DIAGNOSIS — I10 Essential (primary) hypertension: Secondary | ICD-10-CM | POA: Insufficient documentation

## 2018-07-14 DIAGNOSIS — E860 Dehydration: Secondary | ICD-10-CM | POA: Insufficient documentation

## 2018-07-14 LAB — COMPREHENSIVE METABOLIC PANEL
ALK PHOS: 62 U/L (ref 38–126)
ALT: 38 U/L (ref 0–44)
ANION GAP: 11 (ref 5–15)
AST: 33 U/L (ref 15–41)
Albumin: 4.3 g/dL (ref 3.5–5.0)
BILIRUBIN TOTAL: 0.8 mg/dL (ref 0.3–1.2)
BUN: 21 mg/dL (ref 8–23)
CO2: 26 mmol/L (ref 22–32)
CREATININE: 1.03 mg/dL (ref 0.61–1.24)
Calcium: 9.8 mg/dL (ref 8.9–10.3)
Chloride: 99 mmol/L (ref 98–111)
Glucose, Bld: 173 mg/dL — ABNORMAL HIGH (ref 70–99)
Potassium: 3.3 mmol/L — ABNORMAL LOW (ref 3.5–5.1)
Sodium: 136 mmol/L (ref 135–145)
Total Protein: 7.1 g/dL (ref 6.5–8.1)

## 2018-07-14 LAB — CBC
HEMATOCRIT: 48.2 % (ref 39.0–52.0)
HEMOGLOBIN: 16.7 g/dL (ref 13.0–17.0)
MCH: 29.7 pg (ref 26.0–34.0)
MCHC: 34.6 g/dL (ref 30.0–36.0)
MCV: 85.8 fL (ref 80.0–100.0)
Platelets: 258 10*3/uL (ref 150–400)
RBC: 5.62 MIL/uL (ref 4.22–5.81)
RDW: 12.8 % (ref 11.5–15.5)
WBC: 11.8 10*3/uL — AB (ref 4.0–10.5)
nRBC: 0 % (ref 0.0–0.2)

## 2018-07-14 LAB — URINALYSIS, COMPLETE (UACMP) WITH MICROSCOPIC
BILIRUBIN URINE: NEGATIVE
Bacteria, UA: NONE SEEN
GLUCOSE, UA: NEGATIVE mg/dL
KETONES UR: 5 mg/dL — AB
LEUKOCYTES UA: NEGATIVE
NITRITE: NEGATIVE
PROTEIN: NEGATIVE mg/dL
Specific Gravity, Urine: 1.017 (ref 1.005–1.030)
pH: 5 (ref 5.0–8.0)

## 2018-07-14 LAB — LIPASE, BLOOD: Lipase: 28 U/L (ref 11–51)

## 2018-07-14 IMAGING — CT CT ABD-PELV W/O
2 of 5 series · 15 of 46 positions shown, 17 images · non-contrast
Comparison: [DATE]

CLINICAL DATA: Abdominal pain with nausea and vomiting

EXAM:
CT ABDOMEN AND PELVIS WITHOUT CONTRAST
TECHNIQUE: Multidetector CT imaging of the abdomen and pelvis was performed
following the standard protocol without IV contrast.

[Series 5: axial (person_name) (person_name) · axial · 0.88mm/px · z∈[-673,-208]mm · 12 of 111 slices shown, 14 images]
[im 9/111  soft-tissue]
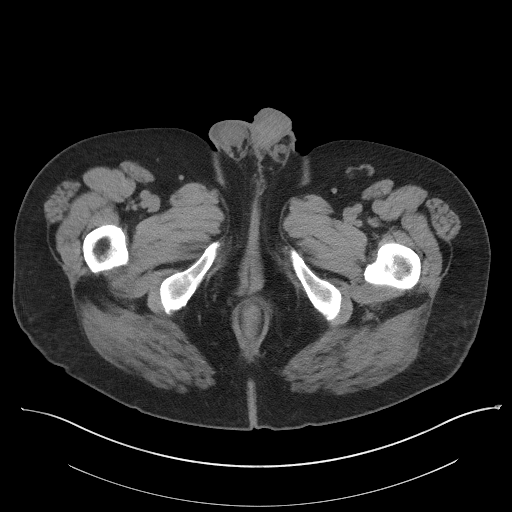
[im 9/111  bone]
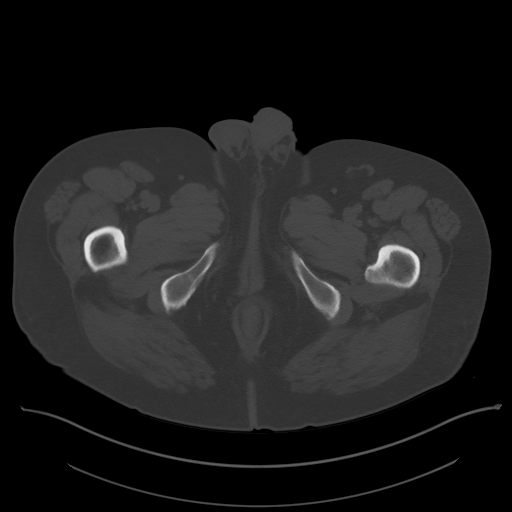
[im 17/111  soft-tissue]
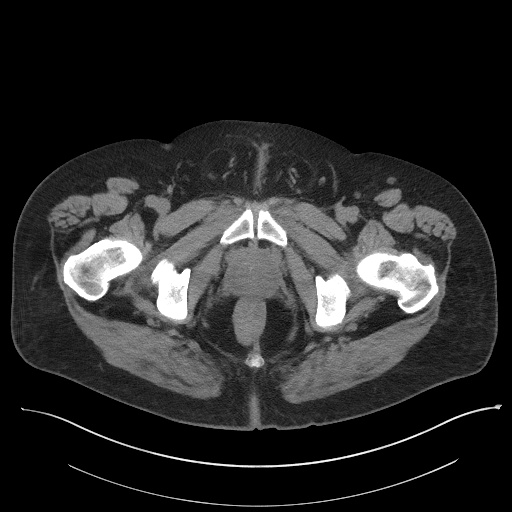
[im 26/111  soft-tissue]
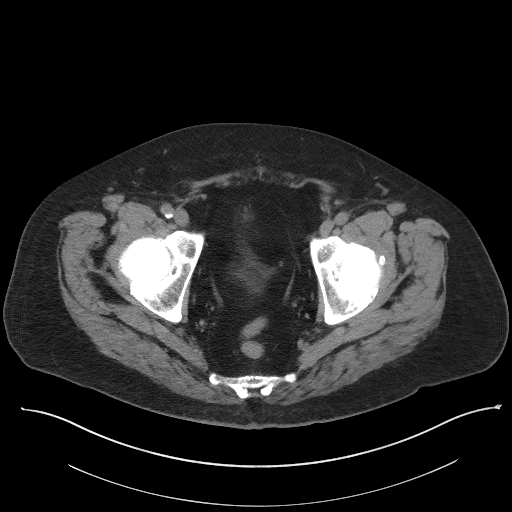
[im 34/111  soft-tissue]
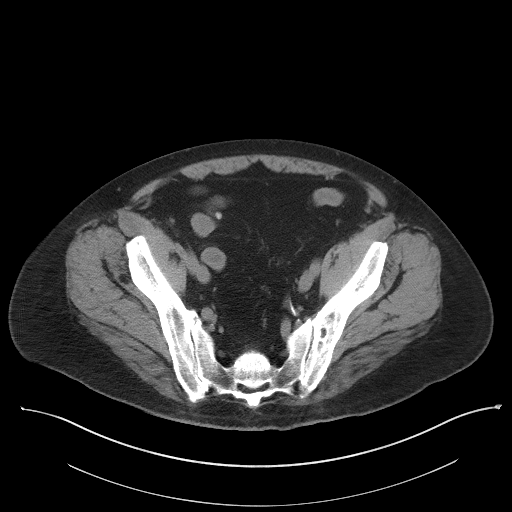
[im 43/111  soft-tissue]
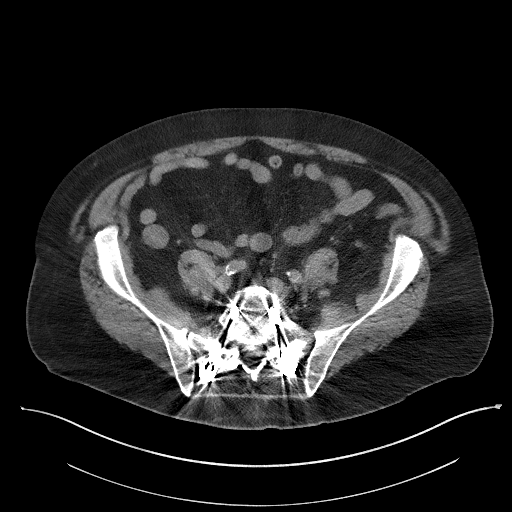
[im 51/111  soft-tissue]
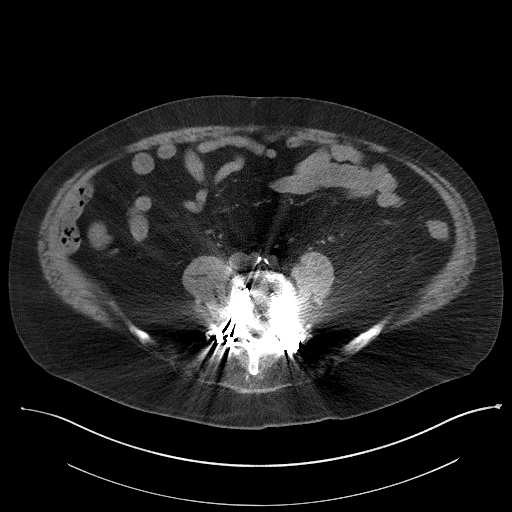
[im 60/111  soft-tissue]
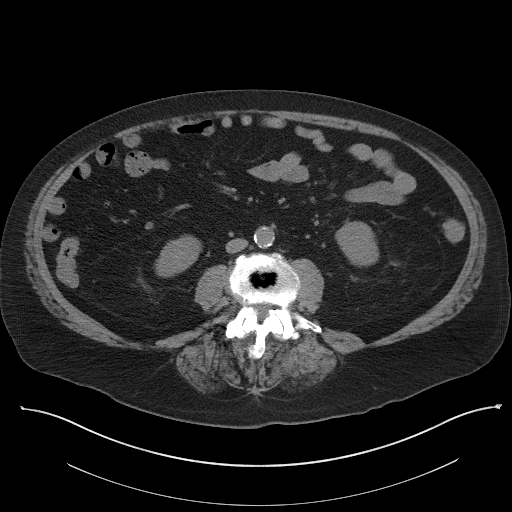
[im 68/111  soft-tissue]
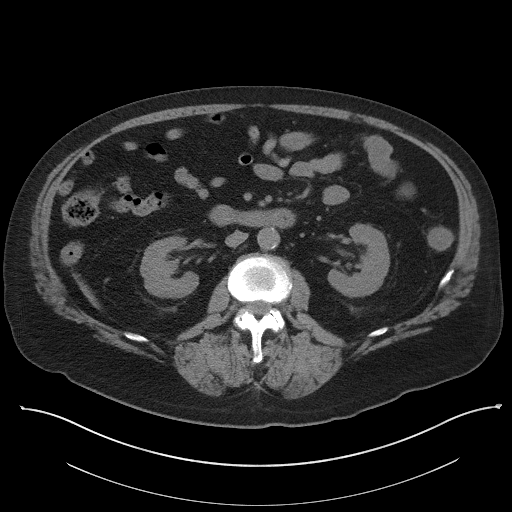
[im 77/111  soft-tissue]
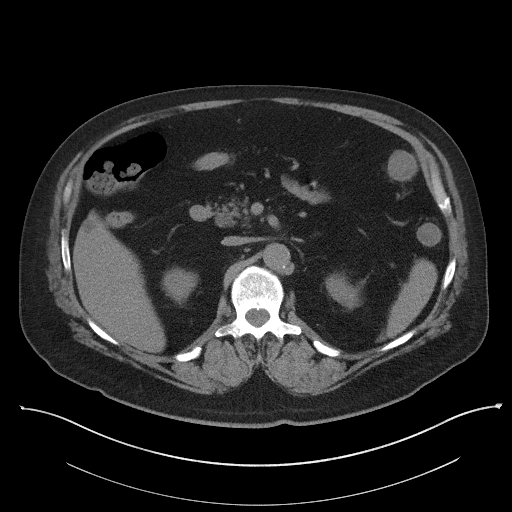
[im 77/111  bone]
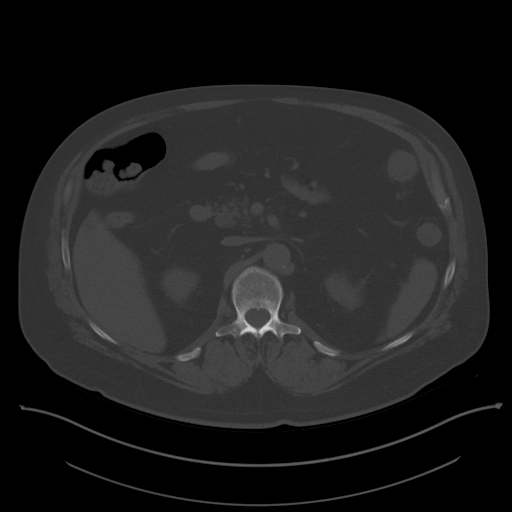
[im 85/111  soft-tissue]
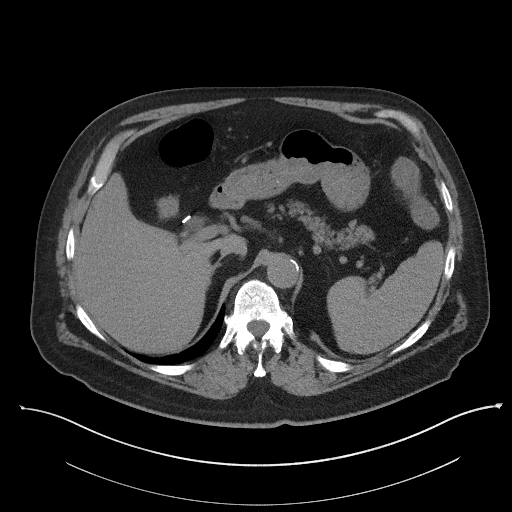
[im 94/111  soft-tissue]
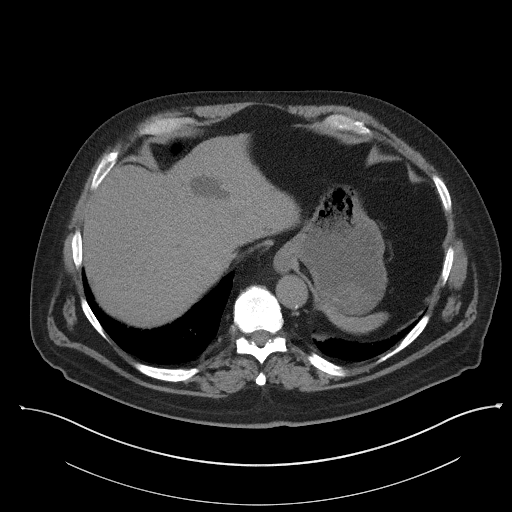
[im 102/111  soft-tissue]
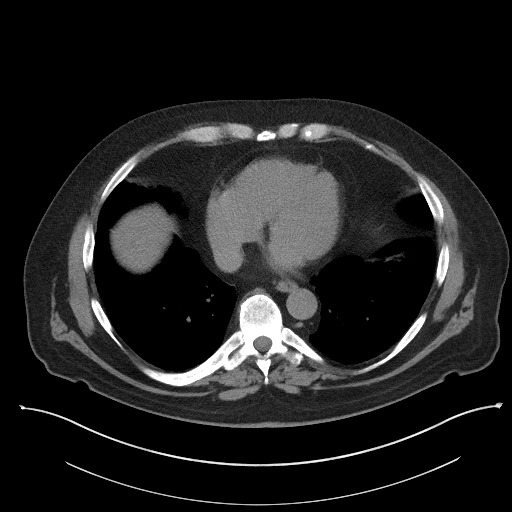

[Series 6: coronal st · coronal · 0.95mm/px · 3 of 95 slices shown]
[im 32/95  soft-tissue]
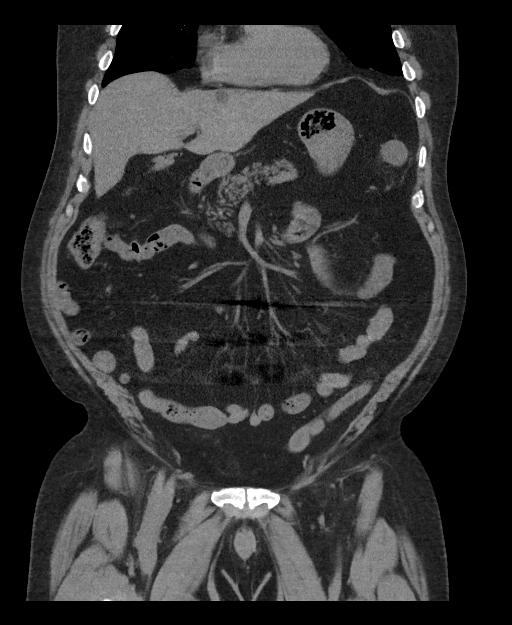
[im 42/95  soft-tissue]
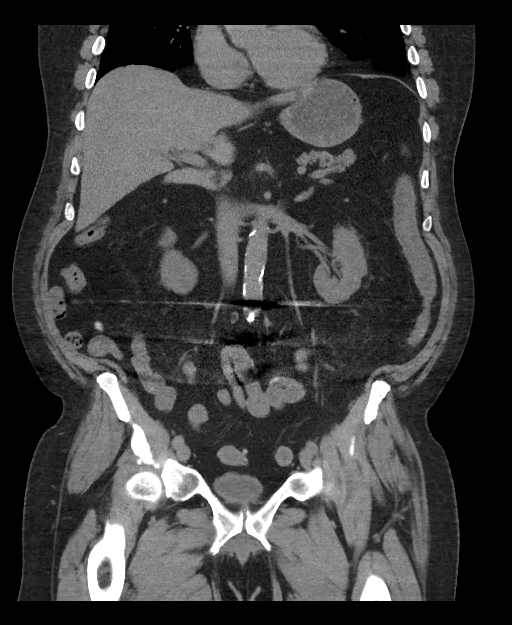
[im 53/95  soft-tissue]
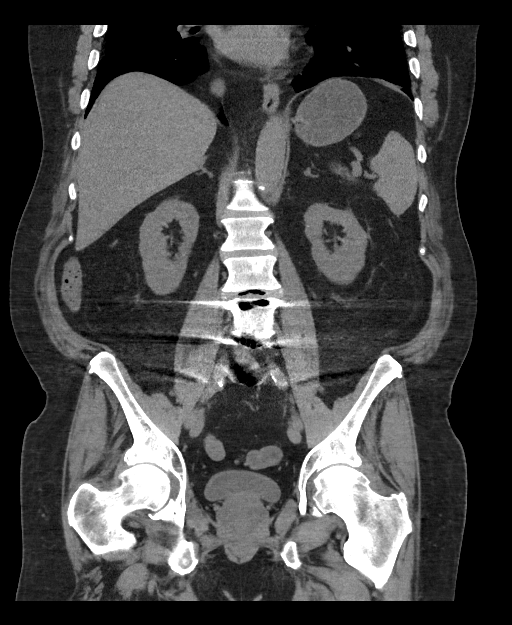

[15 of 46 positions shown; findings below may reference images not displayed]

FINDINGS: Lower chest: Lung bases demonstrate no acute consolidation or
effusion. Mild coronary calcification.

Hepatobiliary: Multiple hypodense liver lesions, some are stable,
others slightly increased in size. Status post cholecystectomy. Mild
extrahepatic biliary enlargement.

Pancreas: Unremarkable. No pancreatic ductal dilatation or
surrounding inflammatory changes.

Spleen: Normal in size without focal abnormality.

Adrenals/Urinary Tract: Adrenal glands are normal. No
hydronephrosis. Small left probable parapelvic cyst. Bladder normal.

Stomach/Bowel: Stomach is within normal limits. Appendix appears
normal. No evidence of bowel wall thickening, distention, or
inflammatory changes. Sigmoid colon diverticular disease without
acute inflammatory change

Vascular/Lymphatic: Moderate aortic atherosclerosis. No aneurysm. No
significantly enlarged lymph nodes.

Reproductive: Enlarged prostate with mass effect on the posterior
bladder base

Other: Negative for free air or free fluid. Fat containing inguinal
hernias. Small fat containing umbilical hernia.

Musculoskeletal: Posterior stabilization rods and fixating screws L3
through S1
IMPRESSION: 1. No CT evidence for acute intra-abdominal or pelvic abnormality.
Negative for hydronephrosis or ureteral stone.
2. Sigmoid colon diverticular disease without acute inflammatory
process
3. Prostatomegaly
4. Multiple hypodense liver lesions, many of which are too small to
further characterize, larger lesions are suggestive of cysts.

## 2018-07-14 NOTE — ED Triage Notes (Signed)
Patient c/o GQH abdominal pain, N/V/D, and hematuria.

## 2018-07-14 NOTE — ED Notes (Signed)
Pt states he has abdominal pain, rectal bleeding, nausea, vomiting, and diarrhea. All symptoms started today.  Wife at bedside.

## 2018-07-15 MED ORDER — HYDROCODONE-ACETAMINOPHEN 5-325 MG PO TABS
2.0000 | ORAL_TABLET | Freq: Once | ORAL | Status: AC
  Administered 2018-07-15: 2 via ORAL
  Filled 2018-07-15: qty 2

## 2018-07-15 MED ORDER — CIPROFLOXACIN HCL 500 MG PO TABS
500.0000 mg | ORAL_TABLET | Freq: Once | ORAL | Status: AC
  Administered 2018-07-15: 500 mg via ORAL
  Filled 2018-07-15: qty 1

## 2018-07-15 MED ORDER — ONDANSETRON HCL 4 MG/2ML IJ SOLN
4.0000 mg | Freq: Once | INTRAMUSCULAR | Status: AC
  Administered 2018-07-15: 4 mg via INTRAVENOUS
  Filled 2018-07-15: qty 2

## 2018-07-15 MED ORDER — ONDANSETRON 4 MG PO TBDP: 4.0000 mg | ORAL_TABLET | Freq: Three times a day (TID) | ORAL | 0 refills | Status: DC | PRN

## 2018-07-15 MED ORDER — SODIUM CHLORIDE 0.9 % IV BOLUS
1000.0000 mL | Freq: Once | INTRAVENOUS | Status: AC
  Administered 2018-07-15: 1000 mL via INTRAVENOUS

## 2018-07-15 MED ORDER — CIPROFLOXACIN HCL 500 MG PO TABS: 500.0000 mg | ORAL_TABLET | Freq: Two times a day (BID) | ORAL | 0 refills | Status: AC

## 2018-07-15 MED ORDER — LOPERAMIDE HCL 2 MG PO CAPS
4.0000 mg | ORAL_CAPSULE | Freq: Once | ORAL | Status: AC
  Administered 2018-07-15: 4 mg via ORAL
  Filled 2018-07-15: qty 2

## 2018-07-15 MED ORDER — HYDROCODONE-ACETAMINOPHEN 5-325 MG PO TABS: 1.0000 | ORAL_TABLET | Freq: Four times a day (QID) | ORAL | 0 refills | Status: DC | PRN

## 2018-07-15 MED ORDER — MORPHINE SULFATE (PF) 4 MG/ML IV SOLN
4.0000 mg | Freq: Once | INTRAVENOUS | Status: DC
  Filled 2018-07-15: qty 1

## 2018-07-15 NOTE — Discharge Instructions (Signed)
Please take your antibiotics as prescribed for the next 3 days and take pain and nausea medication as needed for severe symptoms.  Also use over-the-counter Imodium (loperamide) 1 tablet after every episode of diarrhea up to a total of 8 a day.  Follow-up with your primary care physician as needed and return to the emergency department for any concerns.  It was a pleasure to take care of you today, and thank you for coming to our emergency department.  If you have any questions or concerns before leaving please ask the nurse to grab me and I'm more than happy to go through your aftercare instructions again.  If you were prescribed any opioid pain medication today such as Norco, Vicodin, Percocet, morphine, hydrocodone, or oxycodone please make sure you do not drive when you are taking this medication as it can alter your ability to drive safely.  If you have any concerns once you are home that you are not improving or are in fact getting worse before you can make it to your follow-up appointment, please do not hesitate to call 911 and come back for further evaluation.  Darel Hong, MD  Results for orders placed or performed during the hospital encounter of 07/14/18  Lipase, blood  Result Value Ref Range   Lipase 28 11 - 51 U/L  Comprehensive metabolic panel  Result Value Ref Range   Sodium 136 135 - 145 mmol/L   Potassium 3.3 (L) 3.5 - 5.1 mmol/L   Chloride 99 98 - 111 mmol/L   CO2 26 22 - 32 mmol/L   Glucose, Bld 173 (H) 70 - 99 mg/dL   BUN 21 8 - 23 mg/dL   Creatinine, Ser 1.03 0.61 - 1.24 mg/dL   Calcium 9.8 8.9 - 10.3 mg/dL   Total Protein 7.1 6.5 - 8.1 g/dL   Albumin 4.3 3.5 - 5.0 g/dL   AST 33 15 - 41 U/L   ALT 38 0 - 44 U/L   Alkaline Phosphatase 62 38 - 126 U/L   Total Bilirubin 0.8 0.3 - 1.2 mg/dL   GFR calc non Af Amer >60 >60 mL/min   GFR calc Af Amer >60 >60 mL/min   Anion gap 11 5 - 15  CBC  Result Value Ref Range   WBC 11.8 (H) 4.0 - 10.5 K/uL   RBC 5.62 4.22 -  5.81 MIL/uL   Hemoglobin 16.7 13.0 - 17.0 g/dL   HCT 48.2 39.0 - 52.0 %   MCV 85.8 80.0 - 100.0 fL   MCH 29.7 26.0 - 34.0 pg   MCHC 34.6 30.0 - 36.0 g/dL   RDW 12.8 11.5 - 15.5 %   Platelets 258 150 - 400 K/uL   nRBC 0.0 0.0 - 0.2 %  Urinalysis, Complete w Microscopic  Result Value Ref Range   Color, Urine YELLOW (A) YELLOW   APPearance CLEAR (A) CLEAR   Specific Gravity, Urine 1.017 1.005 - 1.030   pH 5.0 5.0 - 8.0   Glucose, UA NEGATIVE NEGATIVE mg/dL   Hgb urine dipstick SMALL (A) NEGATIVE   Bilirubin Urine NEGATIVE NEGATIVE   Ketones, ur 5 (A) NEGATIVE mg/dL   Protein, ur NEGATIVE NEGATIVE mg/dL   Nitrite NEGATIVE NEGATIVE   Leukocytes, UA NEGATIVE NEGATIVE   RBC / HPF 0-5 0 - 5 RBC/hpf   WBC, UA 0-5 0 - 5 WBC/hpf   Bacteria, UA NONE SEEN NONE SEEN   Squamous Epithelial / LPF 0-5 0 - 5   Mucus PRESENT  Ct Abdomen Pelvis Wo Contrast  Result Date: 07/14/2018 CLINICAL DATA:  Abdominal pain with nausea and vomiting EXAM: CT ABDOMEN AND PELVIS WITHOUT CONTRAST TECHNIQUE: Multidetector CT imaging of the abdomen and pelvis was performed following the standard protocol without IV contrast. COMPARISON:  06/30/2009 FINDINGS: Lower chest: Lung bases demonstrate no acute consolidation or effusion. Mild coronary calcification. Hepatobiliary: Multiple hypodense liver lesions, some are stable, others slightly increased in size. Status post cholecystectomy. Mild extrahepatic biliary enlargement. Pancreas: Unremarkable. No pancreatic ductal dilatation or surrounding inflammatory changes. Spleen: Normal in size without focal abnormality. Adrenals/Urinary Tract: Adrenal glands are normal. No hydronephrosis. Small left probable parapelvic cyst. Bladder normal. Stomach/Bowel: Stomach is within normal limits. Appendix appears normal. No evidence of bowel wall thickening, distention, or inflammatory changes. Sigmoid colon diverticular disease without acute inflammatory change Vascular/Lymphatic:  Moderate aortic atherosclerosis. No aneurysm. No significantly enlarged lymph nodes. Reproductive: Enlarged prostate with mass effect on the posterior bladder base Other: Negative for free air or free fluid. Fat containing inguinal hernias. Small fat containing umbilical hernia. Musculoskeletal: Posterior stabilization rods and fixating screws L3 through S1 IMPRESSION: 1. No CT evidence for acute intra-abdominal or pelvic abnormality. Negative for hydronephrosis or ureteral stone. 2. Sigmoid colon diverticular disease without acute inflammatory process 3. Prostatomegaly 4. Multiple hypodense liver lesions, many of which are too small to further characterize, larger lesions are suggestive of cysts. Electronically Signed   By: Donavan Foil M.D.   On: 07/14/2018 23:23

## 2018-07-15 NOTE — ED Provider Notes (Signed)
[facility] Emergency Department Provider Note  ____________________________________________   First MD Initiated Contact with Patient 07/15/18 0003     (approximate)  I have reviewed the triage vital signs and the nursing notes.   HISTORY  Chief Complaint Abdominal Pain; Hematuria; and Diarrhea   HPI Perry Jones is a 68 y.o. male who comes to the emergency department with lower abdominal pain nausea vomiting and diarrhea that began acutely earlier today.  Symptoms were sudden onset severe.  He had several bowel movements that were loose and following those he had several bowel movements with blood in them.  He says he has had no recent antibiotics.  No recent shellfish.  No recent international travel.   No sick contacts.  He does have a remote cholecystectomy.  Symptoms came on suddenly were severe and nothing seems to make them better or worse.   Past Medical History:  Diagnosis Date  . High cholesterol   . Hypertension   . Sinus trouble     Patient Active Problem List   Diagnosis Date Noted  . Chronic knee pain after total replacement of right knee joint 08/07/2016  . Hyperlipidemia, unspecified 11/10/2015  . Back muscle spasm 02/08/2015  . DDD (degenerative disc disease), lumbar 02/08/2015  . Lumbar radiculitis 02/08/2015  . Status post total right knee replacement 10/29/2014  . Essential hypertension 11/10/2013    Past Surgical History:  Procedure Laterality Date  . BACK SURGERY    . basal cell removal    . CHOLECYSTECTOMY    . COLONOSCOPY WITH PROPOFOL N/A 11/22/2017   Procedure: COLONOSCOPY WITH PROPOFOL;  Surgeon: Lollie Sails, MD;  Location: [facility] END[facility]COPY;  Service: Endoscopy;  Laterality: N/A;  . JOINT REPLACEMENT Right    2007  . KNEE ARTHROSCOPY     right knee  . L1-L4 fuzed     2000  . right knee replaced  2007    Prior to Admission medications   Medication Sig Start Date End Date Taking? Authorizing Provider    zolpidem (AMBIEN) 10 MG tablet Take 1 tablet (10 mg total) by mouth at bedtime as needed for up to 24 days for sleep. 06/12/18 07/15/18 Yes Laverle Hobby, MD  Ascorbic Acid (VITAMIN C) 1000 MG tablet Take by mouth.    [provider]  ASTRAGALUS PO Take by mouth.    [provider]  benazepril-hydrochlorthiazide (LOTENSIN HCT) 20-12.5 MG tablet Take by mouth. 04/21/15   [provider]  Biotin 1000 MCG tablet Take by mouth.    [provider]  cyclobenzaprine (FLEXERIL) 10 MG tablet Take 10 mg by mouth daily as needed.    [provider]  fluticasone (FLONASE) 50 MCG/ACT nasal spray Place into the nose.    [provider]  gabapentin (NEURONTIN) 300 MG capsule Take 300 mg by mouth 3 (three) times daily. 12/18/17   [provider]  HYDROcodone-acetaminophen (NORCO) 5-325 MG tablet Take 1 tablet by mouth every 6 (six) hours as needed for up to 7 doses for severe pain. 07/15/18   Darel Hong, MD  Loratadine 10 MG CAPS Take 10 mg by mouth daily.    [provider]  LORazepam (ATIVAN) 0.5 MG tablet Take by mouth. 06/25/15   [provider]  lovastatin (MEVACOR) 40 MG tablet  05/15/15   [provider]  Multiple Vitamin (MULTI-VITAMINS) TABS Take by mouth.    [provider]  ondansetron (ZOFRAN ODT) 4 MG disintegrating tablet Take 1 tablet (4 mg  total) by mouth every 8 (eight) hours as needed for nausea or vomiting. 07/15/18   Darel Hong, MD  orphenadrine (NORFLEX) 100 MG tablet Take by mouth. 06/16/15   [provider]    Allergies Prednisone  Family History  Family history unknown: Yes    Social History Social History   Tobacco Use  . Smoking status: Former Smoker    Last attempt to quit: 01/24/1985    Years since quitting: 33.5  . Smokeless tobacco: Never Used  Substance Use Topics  . Alcohol use: Yes    Comment: rare  . Drug use: No    Review of  Systems Constitutional: No fever/chills Eyes: No visual changes. ENT: No sore throat. Cardiovascular: Denies chest pain. Respiratory: Denies shortness of breath. Gastrointestinal: Positive for abdominal pain.  Positive for nausea, positive for vomiting.  Positive for diarrhea.  No constipation. Genitourinary: Negative for dysuria. Musculoskeletal: Negative for back pain. Skin: Negative for rash. Neurological: Negative for headaches, focal weakness or numbness.   ____________________________________________   PHYSICAL EXAM:  VITAL SIGNS: ED Triage Vitals  Enc Vitals Group     BP 07/14/18 2130 (!) 148/104     Pulse Rate 07/14/18 2130 80     Resp 07/14/18 2130 17     Temp 07/14/18 2130 97.9 F (36.6 C)     Temp Source 07/14/18 2130 Oral     SpO2 07/14/18 2130 97 %     Weight 07/14/18 2131 235 lb (106.6 kg)     Height 07/14/18 2131 6' (1.829 m)     Head Circumference --      Peak Flow --      Pain Score 07/14/18 2130 8     Pain Loc --      Pain Edu? --      Excl. in [place]? --     Constitutional: Alert and oriented x4 appears quite uncomfortable holding his abdomen groaning in pain Eyes: PERRL EOMI. Head: Atraumatic. Nose: No congestion/rhinnorhea. Mouth/Throat: No trismus Neck: No stridor.   Cardiovascular: Normal rate, regular rhythm. Grossly normal heart sounds.  Good peripheral circulation. Respiratory: Normal respiratory effort.  No retractions. Lungs CTAB and moving good air Gastrointestinal: Soft abdomen diffuse mild tenderness with no focality no rebound or guarding no peritonitis negative Rovsing's no costovertebral tenderness Musculoskeletal: No lower extremity edema   Neurologic:  Normal speech and language. No gross focal neurologic deficits are appreciated. Skin:  Skin is warm, dry and intact. No rash noted. Psychiatric: Mood and affect are normal. Speech and behavior are normal.    ____________________________________________   DIFFERENTIAL includes but  not limited to  Appendicitis, diverticulitis, infectious diarrhea, inflammatory diarrhea, dysentery ____________________________________________   LABS (all labs ordered are listed, but only abnormal results are displayed)  Labs Reviewed  COMPREHENSIVE METABOLIC PANEL - Abnormal; Notable for the following components:      Result Value   Potassium 3.3 (*)    Glucose, Bld 173 (*)    All other components within normal limits  CBC - Abnormal; Notable for the following components:   WBC 11.8 (*)    All other components within normal limits  URINALYSIS, COMPLETE (UACMP) WITH MICROSCOPIC - Abnormal; Notable for the following components:   Color, Urine YELLOW (*)    APPearance CLEAR (*)    Hgb urine dipstick SMALL (*)    Ketones, ur 5 (*)    All other components within normal limits  LIPASE, BLOOD    Lab work reviewed by me with slight ketosis otherwise  largely unremarkable __________________________________________  EKG   ____________________________________________  RADIOLOGY  CT abdomen pelvis reviewed by me with no clear etiology of the patient's symptoms identified ____________________________________________   PROCEDURES  Procedure(s) performed: no  Procedures  Critical Care performed: no  ____________________________________________   INITIAL IMPRESSION / ASSESSMENT AND PLAN / ED COURSE  Pertinent labs & imaging results that were available during my care of the patient were reviewed by me and considered in my medical decision making (see chart for details).   As part of my medical decision making, I reviewed the following data within the [place] History obtained from family if available, nursing notes, old chart and ekg, as well as notes from prior ED visits.  The patient comes to the emergency department quite uncomfortable appearing with sudden onset diffuse abdominal pain along with diarrhea that is progressed to bloody diarrhea.   Establish IV access gave him a liter of fluids and gave him 4 mg of IV morphine and 4 mg of IV Zofran for pain and nausea.  Given the severity of the symptoms and his age I do think he requires a CT scan to evaluate for surgical versus infectious etiology of the symptoms.  Following medications the patient's pain is improved.  Fortunately the patient CT is reassuring.  He has no red flags for C. difficile and I attempted to get a sample but multiple hours he was unable to provide one which goes against C. difficile.  He likely has infectious dysentery so given a first dose of ciprofloxacin here along with loperamide and I will discharge him home with a short course.  Strict return precautions have been given.      ____________________________________________   FINAL CLINICAL IMPRESSION(S) / ED DIAGNOSES  Final diagnoses:  Dehydration  Dysentery      NEW MEDICATIONS STARTED DURING THIS VISIT:  Discharge Medication List as of 07/15/2018  1:59 AM    START taking these medications   Details  ciprofloxacin (CIPRO) 500 MG tablet Take 1 tablet (500 mg total) by mouth 2 (two) times daily for 3 days., Starting Mon 07/15/2018, Until Thu 07/18/2018, Print    HYDROcodone-acetaminophen (NORCO) 5-325 MG tablet Take 1 tablet by mouth every 6 (six) hours as needed for up to 7 doses for severe pain., Starting Mon 07/15/2018, Print    ondansetron (ZOFRAN ODT) 4 MG disintegrating tablet Take 1 tablet (4 mg total) by mouth every 8 (eight) hours as needed for nausea or vomiting., Starting Mon 07/15/2018, Print         Note:  This document was prepared using Dragon voice recognition software and may include unintentional dictation errors.    Darel Hong, MD 07/21/18 0003

## 2018-12-09 ENCOUNTER — Other Ambulatory Visit: Payer: Self-pay | Admitting: Internal Medicine

## 2018-12-09 NOTE — Telephone Encounter (Signed)
DR please advise.  

## 2018-12-09 NOTE — Telephone Encounter (Signed)
DR please advise. Thanks 

## 2018-12-17 ENCOUNTER — Telehealth: Payer: Self-pay | Admitting: Internal Medicine

## 2018-12-17 NOTE — Progress Notes (Signed)
* Perry Jones     Assessment and Plan:  OSA. -AHI of 11, recommended that he be started on CPAP, but he does not feel that it is a problem at this time, therefore he declined.   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   Insomnia, due to above. -Ambien will be continued at 10 mg daily.    Back pain. -Patient has continued back and knee pain which keeps him up at night occasionally, will continue to monitor. Continue current pain medication.    Essential hypertension. -sleep apnea, and insomnia can contribute to high blood pressure, therefore, we will need to monitor.   Return in about 1 year (around 12/18/2019).   Date: 12/17/2018  MRN# [id number] Perry Jones 11-14-1950   Perry Jones is a 68 y.o. old male seen in follow up for chief complaint of  Chief Complaint  Patient presents with  . Follow-up    everythings going well, Lorrin Mais has been doing its job well.      HPI:  Perry Jones is a 68 y.o. male with a history of OSA. He is known to have OSA, but declined PAP.He was tried on a sample of REM fresh with no significant improvement, then tried on sonata which did not help.  He has retired.  He continues to take ambien 10 mg nightly, occasionally 5 mg.  He has lorazepam for anxiety, but this is rare now that he has retired. He has started taking nortryptylline for neuropathy.   sleep study  07/30/2015>>AHI of 11.      Medication:   Outpatient Encounter Medications as of 12/18/2018  Medication Sig  . Ascorbic Acid (VITAMIN C) 1000 MG tablet Take by mouth.  . ASTRAGALUS PO Take by mouth.  . benazepril-hydrochlorthiazide (LOTENSIN HCT) 20-12.5 MG tablet Take by mouth.  . Biotin 1000 MCG tablet Take by mouth.  . cyclobenzaprine (FLEXERIL) 10 MG tablet Take 10 mg by mouth daily as needed.  . fluticasone (FLONASE) 50 MCG/ACT nasal spray Place into the nose.  . gabapentin (NEURONTIN) 300 MG capsule Take 300 mg by  mouth 3 (three) times daily.  Marland Kitchen HYDROcodone-acetaminophen (NORCO) 5-325 MG tablet Take 1 tablet by mouth every 6 (six) hours as needed for up to 7 doses for severe pain.  . Loratadine 10 MG CAPS Take 10 mg by mouth daily.  Marland Kitchen LORazepam (ATIVAN) 0.5 MG tablet Take by mouth.  . lovastatin (MEVACOR) 40 MG tablet   . Multiple Vitamin (MULTI-VITAMINS) TABS Take by mouth.  . ondansetron (ZOFRAN ODT) 4 MG disintegrating tablet Take 1 tablet (4 mg total) by mouth every 8 (eight) hours as needed for nausea or vomiting.  . orphenadrine (NORFLEX) 100 MG tablet Take by mouth.  . zolpidem (AMBIEN) 10 MG tablet TAKE ONE TABLET BY MOUTH EVERY NIGHT AT BEDTIME AS NEEDED FOR SLEEP   No facility-administered encounter medications on file as of 12/18/2018.      Allergies:  Prednisone  Review of Systems:  Constitutional: Feels well. Cardiovascular: Denies chest pain, exertional chest pain.  Pulmonary: Denies hemoptysis, pleuritic chest pain.   The remainder of systems were reviewed and were found to be negative other than what is documented in the HPI.    Physical Examination:   VS: BP (!) 142/82 (BP Location: Left Arm, Cuff Size: Normal)   Pulse 75   Temp 97.9 F (36.6 C) (Temporal)   Ht 6' (1.829 m)   Wt 241 lb 9.6 oz (109.6  kg)   SpO2 100%   BMI 32.77 kg/m   General Appearance: No distress  Neuro:without focal findings, mental status, speech normal, alert and oriented HEENT: PERRLA, EOM intact Pulmonary: No wheezing, No rales  CardiovascularNormal S1,S2.  No m/r/g.  Abdomen: Benign, Soft, non-tender, No masses Renal:  No costovertebral tenderness  GU:  No performed at this time. Endoc: No evident thyromegaly, no signs of acromegaly or Cushing features Skin:   warm, no rashes, no ecchymosis  Extremities: normal, no cyanosis, clubbing.     LABORATORY PANEL:   CBC No results for input(s): WBC, HGB, HCT, PLT in the last 168  hours. ------------------------------------------------------------------------------------------------------------------  Chemistries  No results for input(s): NA, K, CL, CO2, GLUCOSE, BUN, CREATININE, CALCIUM, MG, AST, ALT, ALKPHOS, BILITOT in the last 168 hours.  Invalid input(s): GFRCGP ------------------------------------------------------------------------------------------------------------------  Cardiac Enzymes No results for input(s): TROPONINI in the last 168 hours. ------------------------------------------------------------  RADIOLOGY:   No results found for this or any previous visit. Results for orders placed in visit on 09/25/06  DG Chest 2 View   Narrative * PRIOR REPORT IMPORTED FROM AN EXTERNAL SYSTEM *   PRIOR REPORT IMPORTED FROM THE SYNGO WORKFLOW SYSTEM   REASON FOR EXAM:    htn  COMMENTS:   PROCEDURE:     DXR - DXR CHEST PA (OR AP) AND LATERAL  - Sep 25 2006   9:27AM   RESULT:     Comparison is made to a 17 January 2006.   The lungs are adequately inflated. Slightly increased density is noted at  the LEFT lung base but no discrete infiltrate is seen. The heart and  pulmonary vascularity are within the limits of normal.   IMPRESSION:   I do not see objective evidence of acute cardiopulmonary abnormality.   Thank you for the opportunity to contribute to the care of your patient.       ------------------------------------------------------------------------------------------------------------------  Thank  you for allowing [facility] to assist in the care of your patient. Our recommendations are noted above.  Please contact us if we can be of further service.   Marda Stalker, M.D., F.C.C.P.  Board Certified in Internal Jones, Pulmonary Jones, [place], and Sleep Jones.  [place] [facility] Office Number: [phone]  12/17/2018

## 2018-12-17 NOTE — Telephone Encounter (Signed)
Called patient for COVID-19 pre-screening for in office visit.  Have you recently traveled any where out of the local area in the last 2 weeks? No  Have you been in close contact with a person diagnosed with COVID-19 within the last 2 weeks? No  Do you currently have any of the following symptoms? If so, when did they start? Cough     Diarrhea   Joint Pain Fever      Muscle Pain   Red eyes Shortness of breath   Abdominal pain  Vomiting Loss of smell    Rash    Sore Throat Headache    Weakness   Bruising or bleeding   Okay to proceed with visit 12/18/2018

## 2018-12-18 ENCOUNTER — Other Ambulatory Visit: Payer: Self-pay

## 2018-12-18 ENCOUNTER — Ambulatory Visit: Payer: Medicare HMO | Admitting: Internal Medicine

## 2018-12-18 ENCOUNTER — Encounter: Payer: Self-pay | Admitting: Internal Medicine

## 2018-12-18 VITALS — BP 142/82 | HR 75 | Temp 97.9°F | Ht 72.0 in | Wt 241.6 lb

## 2018-12-18 DIAGNOSIS — F5101 Primary insomnia: Secondary | ICD-10-CM

## 2018-12-18 DIAGNOSIS — G4733 Obstructive sleep apnea (adult) (pediatric): Secondary | ICD-10-CM | POA: Diagnosis not present

## 2018-12-18 DIAGNOSIS — G4726 Circadian rhythm sleep disorder, shift work type: Secondary | ICD-10-CM

## 2018-12-18 DIAGNOSIS — G4719 Other hypersomnia: Secondary | ICD-10-CM

## 2018-12-18 NOTE — Patient Instructions (Signed)
Continue ambien 10 mg nightly.

## 2019-03-12 DIAGNOSIS — D239 Other benign neoplasm of skin, unspecified: Secondary | ICD-10-CM

## 2019-03-12 HISTORY — DX: Other benign neoplasm of skin, unspecified: D23.9

## 2019-08-04 ENCOUNTER — Ambulatory Visit (INDEPENDENT_AMBULATORY_CARE_PROVIDER_SITE_OTHER): Payer: Medicare HMO | Admitting: Internal Medicine

## 2019-08-04 ENCOUNTER — Encounter: Payer: Self-pay | Admitting: Internal Medicine

## 2019-08-04 DIAGNOSIS — F5101 Primary insomnia: Secondary | ICD-10-CM

## 2019-08-04 DIAGNOSIS — G4733 Obstructive sleep apnea (adult) (pediatric): Secondary | ICD-10-CM

## 2019-08-04 MED ORDER — ZOLPIDEM TARTRATE 10 MG PO TABS: ORAL_TABLET | ORAL | 2 refills | Status: DC

## 2019-08-04 NOTE — Patient Instructions (Signed)
AMBIEN AS NEEDED

## 2019-08-04 NOTE — Progress Notes (Signed)
* [facility]   I connected with the patient by telephone enabled telemedicine visit and verified that I am speaking with the correct person using two identifiers.    I discussed the limitations, risks, security and privacy concerns of performing an evaluation and management service by telemedicine and the availability of in-person appointments. I also discussed with the patient that there may be a patient responsible charge related to this service. The patient expressed understanding and agreed to proceed.  PATIENT AGREES AND CONFIRMS -YES   Other persons participating in the visit and their role in the encounter: Patient, nursing  This visit type was conducted due to national recommendations for restrictions regarding the COVID-19 Pandemic (e.g. social distancing).  This format is felt to be most appropriate for this patient at this time.  All issues noted in this document were discussed and addressed.         Date: 08/04/2019  MRN# [id number] Perry Jones 1950-12-14   Perry Jones is a 69 y.o. old male seen in follow up for chief complaint of  No chief complaint on file.   CC FOLLOW UP OSA   HPI H/O OSA AHI 11 07/2015 REFUSES PAP THERAPY AMBIEN 10 MG NIGHTLY LORAZEPAM FOR ANXIETY AS NEEDED NORTRIPTYLINE FOR NEUROPATHY    sleep study  07/30/2015>>AHI of 11.      Medication:   Outpatient Encounter Medications as of 08/04/2019  Medication Sig  . ASTRAGALUS PO Take by mouth.  . benazepril-hydrochlorthiazide (LOTENSIN HCT) 20-12.5 MG tablet Take by mouth.  . Biotin 1000 MCG tablet Take by mouth.  . cyclobenzaprine (FLEXERIL) 10 MG tablet Take 10 mg by mouth daily as needed.  . gabapentin (NEURONTIN) 300 MG capsule Take 300 mg by mouth 3 (three) times daily.  Marland Kitchen HYDROcodone-acetaminophen (NORCO) 5-325 MG tablet Take 1 tablet by mouth every 6 (six) hours as needed for up to 7 doses for severe pain.  . Loratadine 10 MG CAPS Take 10 mg by mouth  daily.  Marland Kitchen LORazepam (ATIVAN) 0.5 MG tablet Take by mouth.  . Multiple Vitamin (MULTI-VITAMINS) TABS Take by mouth.  . nortriptyline (PAMELOR) 10 MG capsule Take 10 mg by mouth at bedtime.  . orphenadrine (NORFLEX) 100 MG tablet Take by mouth.  . zolpidem (AMBIEN) 10 MG tablet TAKE ONE TABLET BY MOUTH EVERY NIGHT AT BEDTIME AS NEEDED FOR SLEEP   No facility-administered encounter medications on file as of 08/04/2019.     Allergies:  Prednisone   Review of Systems:  Gen:  Denies  fever, sweats, chills weight loss  HEENT: Denies blurred vision, double vision, ear pain, eye pain, hearing loss, nose bleeds, sore throat Cardiac:  No dizziness, chest pain or heaviness, chest tightness,edema, No JVD Resp:   No cough, -sputum production, -shortness of breath,-wheezing, -hemoptysis,  Gi: Denies swallowing difficulty, stomach pain, nausea or vomiting, diarrhea, constipation, bowel incontinence Gu:  Denies bladder incontinence, burning urine Ext:   Denies Joint pain, stiffness or swelling Skin: Denies  skin rash, easy bruising or bleeding or hives Endoc:  Denies polyuria, polydipsia , polyphagia or weight change Psych:   Denies depression, insomnia or hallucinations  Other:  All other systems negative       Assessment and Plan:  OSA. AHI 11 RECOMMEND STARTING CPAP BUT HE DOES NOT FEEL ITS A MAJOR PROBLEM AT THIS TIME THEREFORE HE DECLINED  INSOMNIA AMBIEN AS NEEDED  Hypertension - Sleep apnea/INSOMNIA can contribute to hypertension-CONTINUE TO MONITOR     COVID-19 EDUCATION: The  signs and symptoms of COVID-19 were discussed with the patient and how to seek care for testing.  The importance of social distancing was discussed today. Hand Washing Techniques and avoid touching face was advised.     MEDICATION ADJUSTMENTS/LABS AND TESTS ORDERED: AMBIEN AS NEEDED   CURRENT MEDICATIONS REVIEWED AT LENGTH WITH PATIENT TODAY   Patient satisfied with Plan of action and  management. All questions answered  Follow up in [place], M.D.  [facility]  Medical Director [place] Director [facility] Cardio-Pulmonary Department

## 2019-11-20 DIAGNOSIS — F5101 Primary insomnia: Secondary | ICD-10-CM

## 2019-11-21 MED ORDER — ZOLPIDEM TARTRATE 10 MG PO TABS: ORAL_TABLET | ORAL | 0 refills | Status: DC

## 2019-11-21 NOTE — Telephone Encounter (Signed)
Dr Mortimer Fries, please advise on refill and see pt email, he wants to use [facility]---rx last dispensed 08/04/19 #30 with 2 rf  My Zolpidem prescription is due to refill on June 4th. I would prefer we use the [facility] pharmacy which Holland Falling suggests. The only contact number I have for them is  [phone]. When I spoke with them regarding previous prescriptions, they told me to allow extra time for the first time, indicating it may take a week to ten days for the first refill.   regards [name]

## 2019-11-21 NOTE — Telephone Encounter (Addendum)
Flora Lipps, MD  You 5 minutes ago (8:33 AM)   Ok to approve   Message text     Tried calling [facility] and they are not open yet  [facility]

## 2019-11-21 NOTE — Telephone Encounter (Signed)
I have called in rx

## 2019-12-05 DIAGNOSIS — F5101 Primary insomnia: Secondary | ICD-10-CM

## 2019-12-05 NOTE — Telephone Encounter (Signed)
Dr. Mortimer Fries, pt is requesting that Rx for Ambien be sent to [facility].  Rx is pending within this encounter.  Please advise. Thanks

## 2019-12-08 ENCOUNTER — Other Ambulatory Visit: Payer: Self-pay

## 2019-12-08 DIAGNOSIS — F5101 Primary insomnia: Secondary | ICD-10-CM

## 2019-12-08 MED ORDER — ZOLPIDEM TARTRATE 10 MG PO TABS: ORAL_TABLET | ORAL | 0 refills | Status: DC

## 2019-12-08 NOTE — Telephone Encounter (Signed)
Pt is requesting update on RX request.

## 2019-12-08 NOTE — Telephone Encounter (Signed)
Pt has been made aware of renewal per mychart encounter. Nothing further is needed.

## 2019-12-08 NOTE — Telephone Encounter (Signed)
Rx request for Ambien 10mg  received from [facility]. Rx last refilled on 11/21/2019 #90.  Last OV 08/04/2019 with pending refill for 07/2020.  Dr. Mortimer Fries please advise on refill. Thanks

## 2019-12-08 NOTE — Telephone Encounter (Signed)
Yes can refill 

## 2019-12-09 MED ORDER — ZOLPIDEM TARTRATE 5 MG PO TABS: ORAL_TABLET | ORAL | 0 refills | Status: DC

## 2019-12-09 MED ORDER — ZOLPIDEM TARTRATE 10 MG PO TABS: ORAL_TABLET | ORAL | 0 refills | Status: DC

## 2019-12-09 NOTE — Telephone Encounter (Signed)
Pt is requesting that Rx for ambien be sent to [facility].  Pharmacy has been changed within order and order is pending within this encounter.   Dr. Mortimer Fries please advise. Thanks

## 2019-12-09 NOTE — Addendum Note (Signed)
Addended by: Claudette Head A on: 12/09/2019 04:34 PM   Modules accepted: Orders

## 2019-12-09 NOTE — Addendum Note (Signed)
Addended by: Claudette Head A on: 12/09/2019 08:11 AM   Modules accepted: Orders

## 2019-12-09 NOTE — Telephone Encounter (Signed)
Received call from pt, who stated that [facility] has not received Rx for Ambien as of yet.  On the Rx it appears that Rx was ordered as "phoned in". I have changed Rx to "normal" and have marked it as pending within this encounter.   Dr. Mortimer Fries please advise. Thanks.

## 2019-12-10 ENCOUNTER — Telehealth: Payer: Self-pay

## 2019-12-10 ENCOUNTER — Other Ambulatory Visit: Payer: Self-pay | Admitting: Internal Medicine

## 2019-12-10 DIAGNOSIS — F5101 Primary insomnia: Secondary | ICD-10-CM

## 2019-12-10 MED ORDER — ZOLPIDEM TARTRATE 5 MG PO TABS: ORAL_TABLET | ORAL | 0 refills | Status: DC

## 2019-12-10 MED ORDER — ZOLPIDEM TARTRATE 10 MG PO TABS: ORAL_TABLET | ORAL | 0 refills | Status: DC

## 2019-12-10 NOTE — Progress Notes (Signed)
Refilled 10 mg Ambien as requested by patient to [facility]

## 2019-12-10 NOTE — Telephone Encounter (Signed)
Received PA request from [facility] for Ambien 10mg . I have attempted to submit PA. Per CMM PA is not needed.  ATC [facility] to make them aware of this information and was placed on hold >33min.  Will call back  [id number]

## 2019-12-10 NOTE — Telephone Encounter (Signed)
[facility] It appears that the system is changing the pharmacy back to default when placed as "pending".  Dr. Mortimer Fries, will you send Perry Jones to [place] on [address]? Thank you!

## 2019-12-10 NOTE — Progress Notes (Signed)
Sent to [facility]

## 2019-12-10 NOTE — Telephone Encounter (Signed)
ATC [facility] and was placed on hold >2min.  Will call back.

## 2019-12-10 NOTE — Telephone Encounter (Signed)
Pt stated that [facility] received Rx for 5mg  of ambien vs 10mg . Pt states that he has been taking 10mg  of ambien and would like Rx for 10mg  to be sent to [facility].   Dr. Mortimer Fries please advise. Thanks

## 2019-12-11 NOTE — Telephone Encounter (Signed)
Spoke to [place] with [facility] and made him aware that PA fro Lorrin Mais is not needed per [facility].  Ronalee Belts is also aware that it is okay to dispense 90 tablets, as Dr. Mortimer Fries approved this request and sent Rx in for 90 tabs.  Nothing further is needed at this time.

## 2019-12-11 NOTE — Telephone Encounter (Signed)
Please see duplicate message dated 12/10/19.

## 2020-03-02 ENCOUNTER — Other Ambulatory Visit: Payer: Self-pay | Admitting: Internal Medicine

## 2020-03-02 DIAGNOSIS — F5101 Primary insomnia: Secondary | ICD-10-CM

## 2020-03-02 NOTE — Telephone Encounter (Signed)
Dr. Mortimer Fries please advise.  Refilled last on 12/10/2019 #90.  Pending recall for 07/2020.

## 2020-03-07 DIAGNOSIS — F5101 Primary insomnia: Secondary | ICD-10-CM

## 2020-03-09 NOTE — Telephone Encounter (Signed)
Dr. Mortimer Fries please advise on patient mychart message. If you are ok with refilling please sign pended order below.  [facility] sent the refill request for Ambien a week early, last week, I am due to refill on the 14th. If you could please call in the 10mg  to [address].  Thank you

## 2020-03-11 ENCOUNTER — Other Ambulatory Visit: Payer: Self-pay

## 2020-03-11 ENCOUNTER — Ambulatory Visit: Payer: Medicare HMO | Admitting: Dermatology

## 2020-03-11 DIAGNOSIS — L409 Psoriasis, unspecified: Secondary | ICD-10-CM

## 2020-03-11 DIAGNOSIS — L821 Other seborrheic keratosis: Secondary | ICD-10-CM

## 2020-03-11 DIAGNOSIS — D489 Neoplasm of uncertain behavior, unspecified: Secondary | ICD-10-CM

## 2020-03-11 DIAGNOSIS — B079 Viral wart, unspecified: Secondary | ICD-10-CM | POA: Diagnosis not present

## 2020-03-11 DIAGNOSIS — L820 Inflamed seborrheic keratosis: Secondary | ICD-10-CM | POA: Diagnosis not present

## 2020-03-11 DIAGNOSIS — D225 Melanocytic nevi of trunk: Secondary | ICD-10-CM

## 2020-03-11 DIAGNOSIS — Z1283 Encounter for screening for malignant neoplasm of skin: Secondary | ICD-10-CM | POA: Diagnosis not present

## 2020-03-11 DIAGNOSIS — L578 Other skin changes due to chronic exposure to nonionizing radiation: Secondary | ICD-10-CM

## 2020-03-11 DIAGNOSIS — D1800 Hemangioma unspecified site: Secondary | ICD-10-CM | POA: Diagnosis not present

## 2020-03-11 DIAGNOSIS — D229 Melanocytic nevi, unspecified: Secondary | ICD-10-CM

## 2020-03-11 DIAGNOSIS — L814 Other melanin hyperpigmentation: Secondary | ICD-10-CM

## 2020-03-11 NOTE — Progress Notes (Signed)
Follow-Up Visit   Subjective  Perry Jones is a 69 y.o. male who presents for the following: Annual Exam (Total body exam. 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 The patient presents for Total-Body Skin Exam (TBSE) for skin cancer screening and mole check.  The following portions of the chart were reviewed this encounter and updated as appropriate: Tobacco  Allergies  Meds  Problems  Med Hx  Surg Hx  Fam Hx     Review of Systems: No other skin or systemic complaints except as noted in HPI or Assessment and Plan.   Objective  Well appearing patient in no apparent distress; mood and affect are within normal limits.  A full examination was performed including scalp, head, eyes, ears, nose, lips, neck, chest, axillae, abdomen, back, buttocks, bilateral upper extremities, bilateral lower extremities, hands, feet, fingers, toes, fingernails, and toenails. All findings within normal limits unless otherwise noted below.  Objective  Upper Back Spine: 0.6 cm irregular brown macule  Objective  L index finger: Verrucous papule  Objective  Legs: Well-demarcated erythematous papules/plaques with silvery scale, guttate pink scaly papules.   Assessment & Plan  Neoplasm of uncertain behavior Upper Back Spine  Epidermal / dermal shaving  Lesion diameter (cm):  0.6 Informed consent: discussed and consent obtained   Timeout: patient name, date of birth, surgical site, and procedure verified   Procedure prep:  Patient was prepped and draped in usual sterile fashion Prep type:  Isopropyl alcohol Anesthesia: the lesion was anesthetized in a standard fashion   Anesthetic:  1% lidocaine w/ epinephrine 1-100,000 buffered w/ 8.4% NaHCO3 Instrument used: flexible razor blade   Hemostasis achieved with: pressure, aluminum chloride and electrodesiccation   Outcome: patient tolerated procedure  well   Post-procedure details: sterile dressing applied and wound care instructions given   Dressing type: bandage and petrolatum    Specimen 1 - Surgical pathology Differential Diagnosis: nevus vs dysplastic nevus Check Margins: No 0.6 cm irregular brown macule  Inflamed seborrheic keratosis Right Thigh  Destruction of lesion - Right Thigh Complexity: simple   Destruction method: cryotherapy   Informed consent: discussed and consent obtained   Timeout:  patient name, date of birth, surgical site, and procedure verified Lesion destroyed using liquid nitrogen: Yes   Region frozen until ice ball extended beyond lesion: Yes   Outcome: patient tolerated procedure well with no complications   Post-procedure details: wound care instructions given    Viral warts, unspecified type L index finger  Destruction of lesion - L index finger Complexity: simple   Destruction method: cryotherapy   Informed consent: discussed and consent obtained   Timeout:  patient name, date of birth, surgical site, and procedure verified Lesion destroyed using liquid nitrogen: Yes   Region frozen until ice ball extended beyond lesion: Yes   Outcome: patient tolerated procedure well with no complications   Post-procedure details: wound care instructions given    Psoriasis Legs Pt is satisfied with using just moisturizer. Recommend using CereVe cream.   Lentigines - Scattered tan macules - Discussed due to sun exposure - Benign, observe - Call for any changes  Seborrheic Keratoses - Stuck-on, waxy, tan-brown papules and plaques  - Discussed benign etiology and prognosis. - Observe - Call for any changes  Melanocytic Nevi - Tan-brown and/or pink-flesh-colored symmetric macules and papules - Benign appearing on exam  today - Observation - Call clinic for new or changing moles - Recommend daily use of broad spectrum spf 30+ sunscreen to sun-exposed areas.   Hemangiomas - Red papules -  Discussed benign nature - Observe - Call for any changes  Actinic Damage - diffuse scaly erythematous macules with underlying dyspigmentation - Recommend daily broad spectrum sunscreen SPF 30+ to sun-exposed areas, reapply every 2 hours as needed.  - Call for new or changing lesions.  Skin cancer screening performed today.  Return in about 1 year (around 03/11/2021) for TBSE.   IHarriett Sine, CMA, am acting as scribe for Sarina Ser, MD.  Documentation: I have reviewed the above documentation for accuracy and completeness, and I agree with the above.  Sarina Ser, MD

## 2020-03-11 NOTE — Patient Instructions (Signed)

## 2020-03-16 ENCOUNTER — Encounter: Payer: Self-pay | Admitting: Dermatology

## 2020-03-17 ENCOUNTER — Telehealth: Payer: Self-pay

## 2020-03-17 NOTE — Telephone Encounter (Signed)
Pt informed of results. He had no concerns.  °

## 2020-03-17 NOTE — Telephone Encounter (Signed)
-----   Message from Ralene Bathe, MD sent at 03/16/2020  7:11 PM EDT ----- Skin , upper back spine DYSPLASTIC COMPOUND NEVUS WITH MODERATE ATYPIA, CLOSE TO MARGIN  Dysplastic Moderate Recheck next visit Keep appt.

## 2020-03-17 NOTE — Telephone Encounter (Signed)
Left patient message to call office for bx results/sh

## 2020-03-29 ENCOUNTER — Encounter: Payer: Self-pay | Admitting: Student in an Organized Health Care Education/Training Program

## 2020-04-01 ENCOUNTER — Ambulatory Visit
Payer: Medicare HMO | Attending: Student in an Organized Health Care Education/Training Program | Admitting: Student in an Organized Health Care Education/Training Program

## 2020-04-01 ENCOUNTER — Other Ambulatory Visit: Payer: Self-pay

## 2020-04-01 ENCOUNTER — Encounter: Payer: Self-pay | Admitting: Student in an Organized Health Care Education/Training Program

## 2020-04-01 VITALS — BP 140/87 | HR 76 | Temp 98.6°F | Resp 18 | Ht 71.0 in | Wt 231.0 lb

## 2020-04-01 DIAGNOSIS — Z981 Arthrodesis status: Secondary | ICD-10-CM | POA: Diagnosis present

## 2020-04-01 DIAGNOSIS — M5442 Lumbago with sciatica, left side: Secondary | ICD-10-CM

## 2020-04-01 DIAGNOSIS — Z96651 Presence of right artificial knee joint: Secondary | ICD-10-CM | POA: Diagnosis not present

## 2020-04-01 DIAGNOSIS — M47816 Spondylosis without myelopathy or radiculopathy, lumbar region: Secondary | ICD-10-CM | POA: Diagnosis not present

## 2020-04-01 DIAGNOSIS — M5416 Radiculopathy, lumbar region: Secondary | ICD-10-CM | POA: Insufficient documentation

## 2020-04-01 DIAGNOSIS — G8929 Other chronic pain: Secondary | ICD-10-CM | POA: Diagnosis present

## 2020-04-01 DIAGNOSIS — M5136 Other intervertebral disc degeneration, lumbar region: Secondary | ICD-10-CM

## 2020-04-01 DIAGNOSIS — G894 Chronic pain syndrome: Secondary | ICD-10-CM | POA: Diagnosis present

## 2020-04-01 DIAGNOSIS — M961 Postlaminectomy syndrome, not elsewhere classified: Secondary | ICD-10-CM | POA: Insufficient documentation

## 2020-04-01 DIAGNOSIS — M5441 Lumbago with sciatica, right side: Secondary | ICD-10-CM | POA: Diagnosis present

## 2020-04-01 NOTE — Progress Notes (Signed)
Safety precautions to be maintained throughout the outpatient stay will include: orient to surroundings, keep bed in low position, maintain call bell within reach at all times, provide assistance with transfer out of bed and ambulation.  

## 2020-04-01 NOTE — Progress Notes (Signed)
Patient: Perry Jones  Service Category: E/M  Provider: Gillis Santa, MD  DOB: 03-23-1951  DOS: 04/01/2020  Referring Provider: Juluis Pitch, MD  MRN: [id number]  Setting: Ambulatory outpatient  PCP: Juluis Pitch, MD  Type: New Patient  Specialty: Interventional Pain Management    Location: Office  Delivery: Face-to-face     Primary Reason(s) for Visit: Encounter for initial evaluation of one or more chronic problems (new to examiner) potentially causing chronic pain, and posing a threat to normal musculoskeletal function. (Level of risk: High) CC: Back Pain (low), Leg Pain (bilateral, laterally), and Knee Pain (right)  HPI  Perry Jones is a 69 y.o. year old, male patient, who comes for the first time to our practice referred by Juluis Pitch, MD for our initial evaluation of his chronic pain. He has Back muscle spasm; Chronic knee pain after total replacement of right knee joint; DDD (degenerative disc disease), lumbar; Essential hypertension; Hyperlipidemia, unspecified; Chronic radicular lumbar pain; History of total right knee replacement; History of lumbar fusion (L1-L3); Chronic bilateral low back pain with bilateral sciatica; Chronic pain syndrome; and Lumbar facet arthropathy on their problem list. Today he comes in for evaluation of his Back Pain (low), Leg Pain (bilateral, laterally), and Knee Pain (right)  Pain Assessment: Location: Lower Back Radiating: lateral legs Onset: More than a month ago Duration: Chronic pain Quality: Burning, Constant, Nagging, Sharp, Shooting, Tingling (deep, disabling,) Severity: 6 /10 (subjective, self-reported pain score)  Effect on ADL:   Limits ability to ambulate for extended period of time although the patient does rest. Timing: Constant Modifying factors: TENS, medication,heat, ice BP: 140/87  HR: 76  Onset and Duration: Date of onset: 2000 Cause of pain: heavy lifting in 1986 Severity: Getting worse, NAS-11 at its worse: 9/10,  NAS-11 at its best: 5/10, NAS-11 now: 7/10 and NAS-11 on the average: 7/10 Timing: Night, After activity or exercise and After a period of immobility Aggravating Factors: Kneeling, Prolonged sitting, Prolonged standing, Surgery made it worse, Walking and Walking downhill Alleviating Factors: Cold packs, Hot packs, Medications and TENS Associated Problems: Constipation, Dizziness, Erectile dysfunction, Fatigue, Tingling, Weakness, Pain that wakes patient up and Pain that does not allow patient to sleep Quality of Pain: Agonizing, Burning, Constant, Deep, Disabling, Getting longer, Nagging, Sharp, Shooting, Stabbing, Tingling and Tiring Previous Examinations or Tests: Biopsy, Bone scan, CT scan, Discogram, Endoscopy, Epidurogram, Spinal tap, X-rays, Neurological evaluation, Orthopedic evaluation and Chiropractic evaluation Previous Treatments: Biofeedback, Chiropractic manipulations, Epidural steroid injections, Facet blocks, Hypnotherapy, Narcotic medications, Physical Therapy, Spinal cord stimulator, Steroid treatments by mouth, Strengthening exercises, Stretching exercises, TENS and Trigger point injections  Perry Jones is a very pleasant 69 year old male who presents with a chief complaint of low back pain as well as right knee pain that is been longstanding.  Patient has a history of L1-L3 lumbar spine fusion as well as a history of right total knee replacement.  Of note the patient has undergone steroid injections in the past.  He states that steroid injections result in adverse side effects and actually increase his pain.  He believes he has an allergy to steroid medications.  He has done prolotherapy in the past which she finds somewhat helpful.  He is being referred by Dr. Lovie Macadamia, his primary care provider to optimize his medication management.  Patient has been on hydrocodone 7.5 mg 3 times daily as needed for many years.  Although this does provide him with analgesic benefit and improvement in  functional status, he feels that the pain  relief can be optimized.  He has done physical therapy as well as many interventional treatments including a prior spinal cord stimulator trial which helped him out for his leg pain but not his low back pain.  He has not had a lumbar MRI in over 3 years.  Given his persistent low back pain that radiates into his right leg, we discussed proceeding with a lumbar MRI.  Historic Controlled Substance Pharmacotherapy Review  PMP and historical list of controlled substances:  03/12/2020  2   03/12/2020  Hydrocodone-Acetamin 7.5-325  90.00  30 Da Bro   [id number]   [place] ([id number])   0/0  22.50 MME  Comm Ins   [place]     Historical Monitoring: The patient  reports no history of drug use. List of all UDS Test(s): No results found for: MDMA, COCAINSCRNUR, [place], [place], CANNABQUANT, THCU, [place] List of other Serum/Urine Drug Screening Test(s):  No results found for: AMPHSCRSER, BARBSCRSER, BENZOSCRSER, COCAINSCRSER, COCAINSCRNUR, PCPSCRSER, PCPQUANT, THCSCRSER, THCU, CANNABQUANT, OPIATESCRSER, OXYSCRSER, PROPOXSCRSER, ETH Historical Background Evaluation: [place] PMP: PDMP reviewed during this encounter. Online review of the past 89-monthperiod conducted.             [place] Department of public safety, offender search: (Editor, commissioningInformation) Non-contributory Risk Assessment Profile: Aberrant behavior: None observed or detected today Risk factors for fatal opioid overdose: None identified today Fatal overdose hazard ratio (HR): Calculation deferred Non-fatal overdose hazard ratio (HR): Calculation deferred Risk of opioid abuse or dependence: 0.7-3.0% with doses ? 36 MME/day and 6.1-26% with doses ? 120 MME/day. Substance use disorder (SUD) risk level: Low  Pharmacologic Plan: As per protocol, I have not taken over any controlled substance management, pending the results of ordered tests and/or consults.            Initial impression: Pending review of available data and ordered  tests.  Meds   Current Outpatient Medications:  .  ASTRAGALUS PO, Take by mouth., Disp: , Rfl:  .  benazepril-hydrochlorthiazide (LOTENSIN HCT) 20-12.5 MG tablet, Take by mouth., Disp: , Rfl:  .  Biotin 1000 MCG tablet, Take by mouth., Disp: , Rfl:  .  cyclobenzaprine (FLEXERIL) 10 MG tablet, Take 10 mg by mouth daily as needed., Disp: , Rfl:  .  gabapentin (NEURONTIN) 400 MG capsule, Take 400 mg by mouth 5 (five) times daily., Disp: , Rfl:  .  HYDROcodone-acetaminophen (NORCO) 5-325 MG tablet, Take 1 tablet by mouth every 6 (six) hours as needed for up to 7 doses for severe pain., Disp: 7 tablet, Rfl: 0 .  Loratadine 10 MG CAPS, Take 10 mg by mouth daily., Disp: , Rfl:  .  LORazepam (ATIVAN) 0.5 MG tablet, Take by mouth., Disp: , Rfl:  .  Multiple Vitamin (MULTI-VITAMINS) TABS, Take by mouth., Disp: , Rfl:  .  orphenadrine (NORFLEX) 100 MG tablet, Take by mouth., Disp: , Rfl:  .  zolpidem (AMBIEN) 10 MG tablet, TAKE 1 TABLET BY MOUTH EVERY NIGHT AT BEDTIME AS NEEDED FOR SLEEP, Disp: 90 tablet, Rfl: 0 .  nortriptyline (PAMELOR) 10 MG capsule, Take 10 mg by mouth at bedtime., Disp: , Rfl:   Imaging Review  PRIOR REPORT IMPORTED FROM THE [facility] WORKFLOW SYSTEM   REASON FOR EXAM:    BACK PAIN PRIOR LUMBAR SURGERY  COMMENTS:   PROCEDURE:     CT  - CT LUMBAR SPINE WO  - Dec 06 2009  8:48AM   RESULT:   Status post intrathecal injection of Omnipaque-180, multiple images of the  lumbar spine were obtained in coronal, axial and sagittal planes.   The conus medullaris terminates at a T12 level.   The cauda equina demonstrate no evidence of clumping or thickening.   Postsurgical changes are identified within the lumbar spine from the L3-S1  level. This is evident by rod and screw fixation. There is a significant  amount of associated metallic susceptibility artifact associated with this  area of postsurgical change.   Evaluation of the T11-T12 level demonstrates no evidence of  thecal sac  stenosis or neural foraminal narrowing.   At the T12-L1 level there is no evidence of thecal sac stenosis.   At the L1-L2 level there is transverse narrowing of the thecal sac  secondary  to facet hypertrophy. This is a mild finding.   At the L2-L3 disc space level there is also evidence of transverse  narrowing  of the thecal sac, mild secondary to ligamentum flavum hypertrophy. There  is  otherwise no further evidence of thecal sac stenosis.   At the L3-4 and L4-L5 levels there is no evidence of thecal sac stenosis.  Evaluation is degraded by metallic streak artifact.   There is no CT evidence of appreciable neural foraminal narrowing. There  is  mild dextroscoliosis of the lower lumbar spine.   IMPRESSION:      Areas of mild thecal sac narrowing primarily in the  transverse dimensions as described above. There is no evidence of  significant thecal sac stenosis nor appreciable neural foraminal  narrowing.  Evaluation is degraded by metallic susceptibility artifact as described  above.   Thank you for the opportunity to contribute to the care of your patient.       ROS  Cardiovascular: High blood pressure Pulmonary or Respiratory: No reported pulmonary signs or symptoms such as wheezing and difficulty taking a deep full breath (Asthma), difficulty blowing air out (Emphysema), coughing up mucus (Bronchitis), persistent dry cough, or temporary stoppage of breathing during sleep Neurological: Abnormal skin sensations (Peripheral Neuropathy) Psychological-Psychiatric: Difficulty sleeping and or falling asleep Gastrointestinal: Irregular, infrequent bowel movements (Constipation) Genitourinary: No reported renal or genitourinary signs or symptoms such as difficulty voiding or producing urine, peeing blood, non-functioning kidney, kidney stones, difficulty emptying the bladder, difficulty controlling the flow of urine, or chronic kidney disease Hematological:  Brusing easily Endocrine: No reported endocrine signs or symptoms such as high or low blood sugar, rapid heart rate due to high thyroid levels, obesity or weight gain due to slow thyroid or thyroid disease Rheumatologic: Joint aches and or swelling due to excess weight (Osteoarthritis) and Rheumatoid arthritis Musculoskeletal: Negative for myasthenia gravis, muscular dystrophy, multiple sclerosis or malignant hyperthermia Work History: Retired  Allergies  Perry Jones is allergic to prednisone.  Laboratory Chemistry Profile   Renal Lab Results  Component Value Date   BUN 21 07/14/2018   CREATININE 1.03 07/14/2018   GFRAA >60 07/14/2018   GFRNONAA >60 07/14/2018   PROTEINUR NEGATIVE 07/14/2018     Electrolytes Lab Results  Component Value Date   NA 136 07/14/2018   K 3.3 (L) 07/14/2018   CL 99 07/14/2018   CALCIUM 9.8 07/14/2018     Hepatic Lab Results  Component Value Date   AST 33 07/14/2018   ALT 38 07/14/2018   ALBUMIN 4.3 07/14/2018   ALKPHOS 62 07/14/2018   LIPASE 28 07/14/2018     ID No results found for: LYMEIGGIGMAB, HIV, SARSCOV2NAA, STAPHAUREUS, MRSAPCR, HCVAB, PREGTESTUR, RMSFIGG, QFVRPH1IGG, QFVRPH2IGG, LYMEIGGIGMAB   Bone No results found for: VD25OH,  IZ124PY0DXI, PJ8250NL9, JQ7341PF7, 25OHVITD1, 25OHVITD2, 25OHVITD3, TESTOFREE, TESTOSTERONE   Endocrine Lab Results  Component Value Date   GLUCOSE 173 (H) 07/14/2018   GLUCOSEU NEGATIVE 07/14/2018     Neuropathy No results found for: VITAMINB12, FOLATE, HGBA1C, HIV   CNS No results found for: COLORCSF, APPEARCSF, RBCCOUNTCSF, WBCCSF, POLYSCSF, LYMPHSCSF, EOSCSF, PROTEINCSF, GLUCCSF, JCVIRUS, CSFOLI, IGGCSF, LABACHR, ACETBL, LABACHR, ACETBL   Inflammation (CRP: Acute  ESR: Chronic) No results found for: CRP, ESRSEDRATE, LATICACIDVEN   Rheumatology No results found for: RF, ANA, LABURIC, URICUR, LYMEIGGIGMAB, LYMEABIGMQN, HLAB27   Coagulation Lab Results  Component Value Date   PLT 258 07/14/2018      Cardiovascular Lab Results  Component Value Date   HGB 16.7 07/14/2018   HCT 48.2 07/14/2018     Screening No results found for: SARSCOV2NAA, COVIDSOURCE, STAPHAUREUS, MRSAPCR, HCVAB, HIV, PREGTESTUR   Cancer No results found for: CEA, CA125, LABCA2   Allergens No results found for: ALMOND, APPLE, ASPARAGUS, AVOCADO, BANANA, BARLEY, BASIL, BAYLEAF, GREENBEAN, LIMABEAN, WHITEBEAN, BEEFIGE, REDBEET, BLUEBERRY, BROCCOLI, CABBAGE, MELON, CARROT, CASEIN, CASHEWNUT, CAULIFLOWER, CELERY     Note: Lab results reviewed.  PFSH  Drug: Perry Jones  reports no history of drug use. Alcohol:  reports current alcohol use. Tobacco:  reports that he quit smoking about 35 years ago. He has never used smokeless tobacco. Medical:  has a past medical history of High cholesterol, Hypertension, and Sinus trouble. Family: Family history is unknown by patient.  Past Surgical History:  Procedure Laterality Date  . BACK SURGERY    . basal cell removal    . CHOLECYSTECTOMY    . COLONOSCOPY WITH PROPOFOL N/A 11/22/2017   Procedure: COLONOSCOPY WITH PROPOFOL;  Surgeon: Lollie Sails, MD;  Location: [facility] ENDOSCOPY;  Service: Endoscopy;  Laterality: N/A;  . JOINT REPLACEMENT Right    2007  . KNEE ARTHROSCOPY     right knee  . L1-L4 fuzed     2000  . right knee replaced  2007   Active Ambulatory Problems    Diagnosis Date Noted  . Back muscle spasm 02/08/2015  . Chronic knee pain after total replacement of right knee joint 08/07/2016  . DDD (degenerative disc disease), lumbar 02/08/2015  . Essential hypertension 11/10/2013  . Hyperlipidemia, unspecified 11/10/2015  . Chronic radicular lumbar pain 02/08/2015  . History of total right knee replacement 10/29/2014  . History of lumbar fusion (L1-L3) 04/01/2020  . Chronic bilateral low back pain with bilateral sciatica 04/01/2020  . Chronic pain syndrome 04/01/2020  . Lumbar facet arthropathy 04/01/2020   Resolved Ambulatory Problems     Diagnosis Date Noted  . No Resolved Ambulatory Problems   Past Medical History:  Diagnosis Date  . High cholesterol   . Hypertension   . Sinus trouble    Constitutional Exam  General appearance: Well nourished, well developed, and well hydrated. In no apparent acute distress Vitals:   04/01/20 1424  BP: 140/87  Pulse: 76  Resp: 18  Temp: 98.6 F (37 C)  TempSrc: Oral  SpO2: 99%  Weight: 231 lb (104.8 kg)  Height: '5\' 11"'  (1.803 m)   BMI Assessment: Estimated body mass index is 32.22 kg/m as calculated from the following:   Height as of this encounter: '5\' 11"'  (1.803 m).   Weight as of this encounter: 231 lb (104.8 kg).  BMI interpretation table: BMI level Category Range association with higher incidence of chronic pain  <18 kg/m2 Underweight   18.5-24.9 kg/m2 Ideal body weight   25-29.9 kg/m2 Overweight  Increased incidence by 20%  30-34.9 kg/m2 Obese (Class I) Increased incidence by 68%  35-39.9 kg/m2 Severe obesity (Class II) Increased incidence by 136%  >40 kg/m2 Extreme obesity (Class III) Increased incidence by 254%   Patient's current BMI Ideal Body weight  Body mass index is 32.22 kg/m. Ideal body weight: 75.3 kg (166 lb 0.1 oz) Adjusted ideal body weight: 87.1 kg (192 lb 0.1 oz)   BMI Readings from Last 4 Encounters:  04/01/20 32.22 kg/m  12/18/18 32.77 kg/m  07/14/18 31.87 kg/m  01/04/18 34.58 kg/m   Wt Readings from Last 4 Encounters:  04/01/20 231 lb (104.8 kg)  12/18/18 241 lb 9.6 oz (109.6 kg)  07/14/18 235 lb (106.6 kg)  01/04/18 241 lb (109.3 kg)    Psych/Mental status: Alert, oriented x 3 (person, place, & time)       Eyes: PERLA Respiratory: No evidence of acute respiratory distress  Cervical Spine Exam  Skin & Axial Inspection: No masses, redness, edema, swelling, or associated skin lesions Alignment: Symmetrical Functional ROM: Unrestricted ROM      Stability: No instability detected Muscle Tone/Strength: Functionally intact. No  obvious neuro-muscular anomalies detected. Sensory (Neurological): Unimpaired Palpation: No palpable anomalies              Upper Extremity (UE) Exam    Side: Right upper extremity  Side: Left upper extremity  Skin & Extremity Inspection: Skin color, temperature, and hair growth are WNL. No peripheral edema or cyanosis. No masses, redness, swelling, asymmetry, or associated skin lesions. No contractures.  Skin & Extremity Inspection: Skin color, temperature, and hair growth are WNL. No peripheral edema or cyanosis. No masses, redness, swelling, asymmetry, or associated skin lesions. No contractures.  Functional ROM: Unrestricted ROM          Functional ROM: Unrestricted ROM          Muscle Tone/Strength: Functionally intact. No obvious neuro-muscular anomalies detected.   Muscle Tone/Strength: Functionally intact. No obvious neuro-muscular anomalies detected.  Sensory (Neurological): Unimpaired          Sensory (Neurological): Unimpaired          Palpation: No palpable anomalies              Palpation: No palpable anomalies              Provocative Test(s):  Phalen's test: deferred Tinel's test: deferred Apley's scratch test (touch opposite shoulder):  Action 1 (Across chest): deferred Action 2 (Overhead): deferred Action 3 (LB reach): deferred   Provocative Test(s):  Phalen's test: deferred Tinel's test: deferred Apley's scratch test (touch opposite shoulder):  Action 1 (Across chest): deferred Action 2 (Overhead): deferred Action 3 (LB reach): deferred    Thoracic Spine Area Exam  Skin & Axial Inspection: No masses, redness, or swelling Alignment: Symmetrical Functional ROM: Unrestricted ROM Stability: No instability detected Muscle Tone/Strength: Functionally intact. No obvious neuro-muscular anomalies detected. Sensory (Neurological): Unimpaired Muscle strength & Tone: No palpable anomalies  Lumbar Exam  Skin & Axial Inspection: Well healed scar from previous spine surgery  detected Alignment: Symmetrical Functional ROM: Pain restricted ROM affecting both sides Stability: No instability detected Muscle Tone/Strength: Functionally intact. No obvious neuro-muscular anomalies detected. Sensory (Neurological): Musculoskeletal pain pattern dermatomal pain pattern on the right  Provocative Tests: Hyperextension/rotation test: (+) bilaterally for facet joint pain. Lumbar quadrant test (Kemp's test): (+) bilaterally for facet joint pain. Lateral bending test: (+) ipsilateral radicular pain, on the right. Positive for right-sided foraminal stenosis. Patrick's Maneuver: deferred today  FABER* test: deferred today                   S-I anterior distraction/compression test: deferred today         S-I lateral compression test: deferred today         S-I Thigh-thrust test: deferred today         S-I Gaenslen's test: deferred today         *(Flexion, ABduction and External Rotation)  Gait & Posture Assessment  Ambulation: Limited Gait: Antalgic Posture: Difficulty standing up straight, due to pain   Lower Extremity Exam    Side: Right lower extremity  Side: Left lower extremity  Stability: No instability observed          Stability: No instability observed          Skin & Extremity Inspection: Evidence of prior right knee arthroplastic surgery, right knee in brace  Skin & Extremity Inspection: Skin color, temperature, and hair growth are WNL. No peripheral edema or cyanosis. No masses, redness, swelling, asymmetry, or associated skin lesions. No contractures.  Functional ROM: Pain restricted ROM for knee joint          Functional ROM: Unrestricted ROM                  Muscle Tone/Strength: Functionally intact. No obvious neuro-muscular anomalies detected.  Muscle Tone/Strength: Functionally intact. No obvious neuro-muscular anomalies detected.  Sensory (Neurological): Arthropathic arthralgia        Sensory (Neurological): Unimpaired         DTR: Patellar: deferred today Achilles: deferred today Plantar: deferred today  DTR: Patellar: deferred today Achilles: deferred today Plantar: deferred today  Palpation: No palpable anomalies  Palpation: No palpable anomalies   Assessment  Primary Diagnosis & Pertinent Problem List: The primary encounter diagnosis was Chronic radicular lumbar pain. Diagnoses of History of lumbar fusion (L1-L3), Lumbar facet arthropathy, History of total right knee replacement, Chronic bilateral low back pain with bilateral sciatica, Chronic pain syndrome, Other intervertebral disc degeneration, lumbar region, and Post laminectomy syndrome were also pertinent to this visit.  Visit Diagnosis (New problems to examiner): 1. Chronic radicular lumbar pain   2. History of lumbar fusion (L1-L3)   3. Lumbar facet arthropathy   4. History of total right knee replacement   5. Chronic bilateral low back pain with bilateral sciatica   6. Chronic pain syndrome   7. Other intervertebral disc degeneration, lumbar region   8. Post laminectomy syndrome    General Recommendations: The pain condition that the patient suffers from is best treated with a multidisciplinary approach that involves an increase in physical activity to prevent de-conditioning and worsening of the pain cycle, as well as psychological counseling (formal and/or informal) to address the co-morbid psychological affects of pain. Treatment will often involve judicious use of pain medications and interventional procedures to decrease the pain, allowing the patient to participate in the physical activity that will ultimately produce long-lasting pain reductions. The goal of the multidisciplinary approach is to return the patient to a higher level of overall function and to restore their ability to perform activities of daily living.  Plan of Care (Initial workup plan)   Lab Orders     Compliance Drug Analysis, Ur  Imaging Orders     MR LUMBAR SPINE W  WO CONTRAST   Pharmacological management options:  Opioid Analgesics: The patient was informed that there is no guarantee that he would be a candidate for opioid analgesics.  The decision will be made following CDC guidelines. This decision will be based on the results of diagnostic studies, as well as Perry Jones risk profile.   Membrane stabilizer: Adequate regimen  Muscle relaxant: Adequate regimen  NSAID: To be determined at a later time  Other analgesic(s): To be determined at a later time   Interventional management options: Perry Jones was informed that there is no guarantee that he would be a candidate for interventional therapies. The decision will be based on the results of diagnostic studies, as well as Perry Jones risk profile.  Procedure(s) under consideration:  Pending lumbar MRI   Provider-requested follow-up: Return in about 2 weeks (around 04/15/2020) for Medication Management, in person.  Future Appointments  Date Time Provider [place]  04/15/2020  3:20 PM Gillis Santa, MD [facility] None  03/17/2021  9:00 AM Ralene Bathe, MD [facility] None    Note by: Gillis Santa, MD Date: 04/01/2020; Time: 4:41 PM

## 2020-04-06 LAB — COMPLIANCE DRUG ANALYSIS, UR

## 2020-04-12 MED ORDER — ZOLPIDEM TARTRATE 10 MG PO TABS: ORAL_TABLET | ORAL | 0 refills | Status: DC

## 2020-04-14 ENCOUNTER — Telehealth: Payer: Self-pay | Admitting: *Deleted

## 2020-04-15 ENCOUNTER — Other Ambulatory Visit: Payer: Self-pay

## 2020-04-15 ENCOUNTER — Ambulatory Visit
Payer: Medicare HMO | Attending: Student in an Organized Health Care Education/Training Program | Admitting: Student in an Organized Health Care Education/Training Program

## 2020-04-15 ENCOUNTER — Encounter: Payer: Self-pay | Admitting: Student in an Organized Health Care Education/Training Program

## 2020-04-15 VITALS — BP 142/91 | HR 75 | Temp 98.1°F | Resp 16 | Ht 72.0 in | Wt 230.0 lb

## 2020-04-15 DIAGNOSIS — M961 Postlaminectomy syndrome, not elsewhere classified: Secondary | ICD-10-CM

## 2020-04-15 DIAGNOSIS — M47816 Spondylosis without myelopathy or radiculopathy, lumbar region: Secondary | ICD-10-CM

## 2020-04-15 DIAGNOSIS — Z96651 Presence of right artificial knee joint: Secondary | ICD-10-CM

## 2020-04-15 DIAGNOSIS — M5136 Other intervertebral disc degeneration, lumbar region: Secondary | ICD-10-CM

## 2020-04-15 DIAGNOSIS — Z981 Arthrodesis status: Secondary | ICD-10-CM | POA: Diagnosis not present

## 2020-04-15 DIAGNOSIS — G894 Chronic pain syndrome: Secondary | ICD-10-CM

## 2020-04-15 DIAGNOSIS — M5416 Radiculopathy, lumbar region: Secondary | ICD-10-CM

## 2020-04-15 DIAGNOSIS — M5441 Lumbago with sciatica, right side: Secondary | ICD-10-CM

## 2020-04-15 DIAGNOSIS — M5442 Lumbago with sciatica, left side: Secondary | ICD-10-CM

## 2020-04-15 DIAGNOSIS — G8929 Other chronic pain: Secondary | ICD-10-CM

## 2020-04-15 MED ORDER — HYDROCODONE-ACETAMINOPHEN 10-325 MG PO TABS: 1.0000 | ORAL_TABLET | Freq: Four times a day (QID) | ORAL | 0 refills | Status: DC | PRN

## 2020-04-15 NOTE — Patient Instructions (Addendum)
1. Please set up P2P with myself to assist in getting [name] approved (was previously denied) 2. Sign pain contract 3. Follow up in 3 months  HYHYDROcodone-acetaminophen ([place]) 10-325 MG  1) 05/13/20 2) 06/12/20     Medication Rules  Applies to: All patients receiving prescriptions (written or electronic).  Pharmacy of record: Pharmacy where electronic prescriptions will be sent. If written prescriptions are taken to a different pharmacy, please inform the nursing staff. The pharmacy listed in the electronic medical record should be the one where you would like electronic prescriptions to be sent.  Prescription refills: Only during scheduled appointments. Applies to both, written and electronic prescriptions.  NOTE: The following applies primarily to controlled substances (Opioid* Pain Medications).   Patient's responsibilities: 1. Pain Pills: Bring all pain pills to every appointment (except for procedure appointments). 2. Pill Bottles: Bring pills in original pharmacy bottle. Always bring newest bottle. Bring bottle, even if empty. 3. Medication refills: You are responsible for knowing and keeping track of what medications you need refilled. The day before your appointment, write a list of all prescriptions that need to be refilled. Bring that list to your appointment and give it to the admitting nurse. Prescriptions will be written only during appointments. If you forget a medication, it will not be "Called in", "Faxed", or "electronically sent". You will need to get another appointment to get these prescribed. 4. Prescription Accuracy: You are responsible for carefully inspecting your prescriptions before leaving our office. Have the discharge nurse carefully go over each prescription with you, before taking them home. Make sure that your name is accurately spelled, that your address is correct. Check the name and dose of your medication to make sure it is accurate. Check the  number of pills, and the written instructions to make sure they are clear and accurate. Make sure that you are given enough medication to last until your next medication refill appointment. 5. Taking Medication: Take medication as prescribed. Never take more pills than instructed. Never take medication more frequently than prescribed. Taking less pills or less frequently is permitted and encouraged, when it comes to controlled substances (written prescriptions).  6. Inform other Doctors: Always inform, all of your healthcare providers, of all the medications you take. 7. Pain Medication from other Providers: You are not allowed to accept any additional pain medication from any other Doctor or Healthcare provider. There are two exceptions to this rule. (see below) In the event that you require additional pain medication, you are responsible for notifying us, as stated below. 8. Medication Agreement: You are responsible for carefully reading and following our Medication Agreement. This must be signed before receiving any prescriptions from our practice. Safely store a copy of your signed Agreement. Violations to the Agreement will result in no further prescriptions. (Additional copies of our Medication Agreement are available upon request.) 9. Laws, Rules, & Regulations: All patients are expected to follow all Federal and [employer], [employer], Rules, [employer]. Ignorance of the Laws does not constitute a valid excuse. The use of any illegal substances is prohibited. 10. Adopted CDC guidelines & recommendations: Target dosing levels will be at or below 60 MME/day. Use of benzodiazepines** is not recommended.  Exceptions: There are only two exceptions to the rule of not receiving pain medications from other Healthcare Providers. 1. Exception #1 (Emergencies): In the event of an emergency (i.e.: accident requiring emergency care), you are allowed to receive additional pain medication. However, you are  responsible for: As soon  as you are able, call our office [phone], at any time of the day or night, and leave a message stating your name, the date and nature of the emergency, and the name and dose of the medication prescribed. In the event that your call is answered by a member of our staff, make sure to document and save the date, time, and the name of the person that took your information.  2. Exception #2 (Planned Surgery): In the event that you are scheduled by another doctor or dentist to have any type of surgery or procedure, you are allowed (for a period no longer than 30 days), to receive additional pain medication, for the acute post-op pain. However, in this case, you are responsible for picking up a copy of our "Post-op Pain Management for Surgeons" handout, and giving it to your surgeon or dentist. This document is available at our office, and does not require an appointment to obtain it. Simply go to our office during business hours (Monday-Thursday from 8:00 AM to 4:00 PM) (Friday 8:00 AM to 12:00 Noon) or if you have a scheduled appointment with [place], prior to your surgery, and ask for it by name. In addition, you will need to provide [place] with your name, name of your surgeon, type of surgery, and date of procedure or surgery.  *Opioid medications include: morphine, codeine, oxycodone, oxymorphone, hydrocodone, hydromorphone, meperidine, tramadol, tapentadol, buprenorphine, fentanyl, methadone. **Benzodiazepine medications include: diazepam (Valium), alprazolam (Xanax), clonazepam (Klonopine), lorazepam (Ativan), clorazepate (Tranxene), chlordiazepoxide (Librium), estazolam (Prosom), oxazepam (Serax), temazepam (Restoril), triazolam (Halcion) (Last updated: 08/23/2017)

## 2020-04-15 NOTE — Progress Notes (Signed)
Safety precautions to be maintained throughout the outpatient stay will include: orient to surroundings, keep bed in low position, maintain call bell within reach at all times, provide assistance with transfer out of bed and ambulation.  

## 2020-04-15 NOTE — Progress Notes (Signed)
PROVIDER NOTE: Information contained herein reflects review and annotations entered in association with encounter. Interpretation of such information and data should be left to medically-trained personnel. Information provided to patient can be located elsewhere in the medical record under "Patient Instructions". Document created using STT-dictation technology, any transcriptional errors that may result from process are unintentional.    Patient: Perry Jones  Service Category: E/M  Provider: Gillis Santa, MD  DOB: 1950-07-10  DOS: 04/15/2020  Specialty: Interventional Pain Management  MRN: [id number]  Setting: Ambulatory outpatient  PCP: Juluis Pitch, MD  Type: Established Patient    Referring Provider: Juluis Pitch, MD  Location: Office  Delivery: Face-to-face     Primary Reason(s) for Visit: Encounter for evaluation before starting new chronic pain management plan of care (Level of risk: moderate) CC: Follow-up  HPI  Perry Jones is a 69 y.o. year old, male patient, who comes today for a follow-up evaluation to review the test results and decide on a treatment plan. He has Back muscle spasm; Chronic knee pain after total replacement of right knee joint; DDD (degenerative disc disease), lumbar; Essential hypertension; Hyperlipidemia, unspecified; Chronic radicular lumbar pain; History of total right knee replacement; History of lumbar fusion (L1-L3); Chronic bilateral low back pain with bilateral sciatica; Chronic pain syndrome; and Lumbar facet arthropathy on their problem list. His primarily concern today is the Follow-up  Pain Assessment: Location: Right Knee Radiating: Radiates from knee down toes and from knee up to right hip Onset: More than a month ago Duration: Chronic pain Quality: Constant, Radiating Severity: 7 /10 (subjective, self-reported pain score)  Effect on ADL: "I don;t walk much, have to wear brace and cane" Timing: Constant Modifying factors: Volteran gel but not  much else BP: (!) 142/91   HR: 75  Mr. Corriher comes in today for a follow-up visit after his initial evaluation on 04/14/2020. Today we went over the results of his tests. These were explained in "Layman's terms". During today's appointment we went over my diagnostic impression, as well as the proposed treatment plan.  [name] follows up today for his second patient visit.  Urine toxicology screen results unremarkable.  We will have patient sign pain contract today and prescribe hydrocodone 10 mg 3 times daily as needed.  Unfortunately his lumbar MRI that was ordered at his initial visit was denied.  We will have to set a peer to peer to hopefully get it approved.  See HPI from initial intake note on 04/01/2020:  Linna Jones is a very pleasant 69 year old male who presents with a chief complaint of low back pain as well as right knee pain that is been longstanding.  Patient has a history of L1-L3 lumbar spine fusion as well as a history of right total knee replacement.  Of note the patient has undergone steroid injections in the past.  He states that steroid injections result in adverse side effects and actually increase his pain.  He believes he has an allergy to steroid medications.  He has done prolotherapy in the past which she finds somewhat helpful.  He is being referred by Dr. Lovie Macadamia, his primary care provider to optimize his medication management.  Patient has been on hydrocodone 7.5 mg 3 times daily as needed for many years.  Although this does provide him with analgesic benefit and improvement in functional status, he feels that the pain relief can be optimized.  He has done physical therapy as well as many interventional treatments including a prior spinal cord stimulator trial which  helped him out for his leg pain but not his low back pain.  He has not had a lumbar MRI in over 3 years.  Given his persistent low back pain that radiates into his right leg, we discussed proceeding with a lumbar  MRI.  Controlled Substance Pharmacotherapy Assessment REMS (Risk Evaluation and Mitigation Strategy)  Analgesic: Hydrocodone 10 mg 3 times daily as needed, quantity 90/month; MME equals 30   Pill Count: None expected due to no prior prescriptions written by our practice. Janne Napoleon, RN  04/15/2020  3:05 PM  Sign when Signing Visit Safety precautions to be maintained throughout the outpatient stay will include: orient to surroundings, keep bed in low position, maintain call bell within reach at all times, provide assistance with transfer out of bed and ambulation.    Pharmacokinetics: Liberation and absorption (onset of action): WNL Distribution (time to peak effect): WNL Metabolism and excretion (duration of action): WNL         Pharmacodynamics: Desired effects: Analgesia: Perry Jones reports >50% benefit. Functional ability: Patient reports that medication allows him to accomplish basic ADLs Clinically meaningful improvement in function (CMIF): Sustained CMIF goals met Perceived effectiveness: Described as relatively effective, allowing for increase in activities of daily living (ADL) Undesirable effects: Side-effects or Adverse reactions: None reported Monitoring: [place] PMP: PDMP not reviewed this encounter. Online review of the past 75-monthperiod previously conducted. Not applicable at this point since we have not taken over the patient's medication management yet. List of other Serum/Urine Drug Screening Test(s):  No results found for: AMPHSCRSER, BARBSCRSER, BENZOSCRSER, COCAINSCRSER, COCAINSCRNUR, PCPSCRSER, THCSCRSER, THCU, CANNABQUANT, O[place] PHodgeman [place]ervilleList of all UDS test(s) done:  Lab Results  Component Value Date   SUMMARY Note 04/01/2020   Last UDS on record: Summary  Date Value Ref Range Status  04/01/2020 Note  Final    Comment:    ==================================================================== Compliance Drug Analysis,  Ur ==================================================================== Test                             Result       Flag       Units  Drug Present and Declared for Prescription Verification   Lorazepam                      54           EXPECTED   ng/mg creat    Source of lorazepam is a scheduled prescription medication.    Hydrocodone                    913          EXPECTED   ng/mg creat   Hydromorphone                  698          EXPECTED   ng/mg creat   Dihydrocodeine                 69           EXPECTED   ng/mg creat   Norhydrocodone                 1562         EXPECTED   ng/mg creat    Sources of hydrocodone include scheduled prescription medications.    Hydromorphone, dihydrocodeine and norhydrocodone are expected    metabolites of hydrocodone.  Hydromorphone and dihydrocodeine are    also available as scheduled prescription medications.    Gabapentin                     PRESENT      EXPECTED   Orphenadrine                   PRESENT      EXPECTED   Zolpidem                       PRESENT      EXPECTED   Zolpidem Acid                  PRESENT      EXPECTED    Zolpidem acid is an expected metabolite of zolpidem.    Acetaminophen                  PRESENT      EXPECTED  Drug Absent but Declared for Prescription Verification   Cyclobenzaprine                Not Detected UNEXPECTED   Nortriptyline                  Not Detected UNEXPECTED ==================================================================== Test                      Result    Flag   Units      Ref Range   Creatinine              128              mg/dL      >=20 ==================================================================== Declared Medications:  The flagging and interpretation on this report are based on the  following declared medications.  Unexpected results may arise from  inaccuracies in the declared medications.   **Note: The testing scope of this panel includes these medications:   Cyclobenzaprine  (Flexeril)  Gabapentin (Neurontin)  Hydrocodone (Norco)  Lorazepam (Ativan)  Nortriptyline (Pamelor)  Orphenadrine (Norflex)   **Note: The testing scope of this panel does not include small to  moderate amounts of these reported medications:   Acetaminophen (Norco)  Zolpidem (Ambien)   **Note: The testing scope of this panel does not include the  following reported medications:   Benazepril  Biotin  Hydrochlorothiazide  Loratadine  Multivitamin  Supplement ==================================================================== For clinical consultation, please call [phone]. ====================================================================    UDS interpretation: No unexpected findings.          Medication Assessment Form: Patient introduced to form today Treatment compliance: Treatment may start today if patient agrees with proposed plan. Evaluation of compliance is not applicable at this point Risk Assessment Profile: Aberrant behavior: See initial evaluations. None observed or detected today Comorbid factors increasing risk of overdose: See initial evaluation. No additional risks detected today Opioid risk tool (ORT):  Opioid Risk  04/15/2020  Alcohol 0  Illegal Drugs 0  Rx Drugs 0  Alcohol 0  Illegal Drugs 0  Rx Drugs 0  Age between 16-45 years  0  Psychological Disease 0  Depression 0  Opioid Risk Tool Scoring 0  Opioid Risk Interpretation Low Risk    ORT Scoring interpretation table:  Score <3 = Low Risk for SUD  Score between 4-7 = Moderate Risk for SUD  Score >8 = High Risk for Opioid Abuse   Risk of substance use disorder (SUD): Low  Risk Mitigation Strategies:  Patient opioid safety counseling: Completed today. Counseling provided to patient as per "Patient Counseling Document". Document signed by patient, attesting to counseling and understanding Patient-Prescriber Agreement (PPA): Obtained today.  Controlled substance notification to other  providers: Written and sent today.  Pharmacologic Plan: Today we may be taking over the patient's pharmacological regimen. See below.             Laboratory Chemistry Profile   Renal Lab Results  Component Value Date   BUN 21 07/14/2018   CREATININE 1.03 07/14/2018   GFRAA >60 07/14/2018   GFRNONAA >60 07/14/2018   PROTEINUR NEGATIVE 07/14/2018     Electrolytes Lab Results  Component Value Date   NA 136 07/14/2018   K 3.3 (L) 07/14/2018   CL 99 07/14/2018   CALCIUM 9.8 07/14/2018     Hepatic Lab Results  Component Value Date   AST 33 07/14/2018   ALT 38 07/14/2018   ALBUMIN 4.3 07/14/2018   ALKPHOS 62 07/14/2018   LIPASE 28 07/14/2018     ID No results found for: LYMEIGGIGMAB, HIV, [place], STAPHAUREUS, MRSAPCR, HCVAB, PREGTESTUR, RMSFIGG, QFVRPH1IGG, QFVRPH2IGG, LYMEIGGIGMAB   Bone No results found for: VD25OH, MA004HT9HFS, FS2395VU0, EB3435WY6, 25OHVITD1, 25OHVITD2, 25OHVITD3, TESTOFREE, TESTOSTERONE   Endocrine Lab Results  Component Value Date   GLUCOSE 173 (H) 07/14/2018   GLUCOSEU NEGATIVE 07/14/2018     Neuropathy No results found for: VITAMINB12, FOLATE, HGBA1C, HIV   CNS No results found for: COLORCSF, APPEARCSF, RBCCOUNTCSF, WBCCSF, POLYSCSF, LYMPHSCSF, EOSCSF, PROTEINCSF, GLUCCSF, JCVIRUS, CSFOLI, IGGCSF, LABACHR, ACETBL, LABACHR, ACETBL   Inflammation (CRP: Acute   ESR: Chronic) No results found for: CRP, ESRSEDRATE, LATICACIDVEN   Rheumatology No results found for: RF, ANA, LABURIC, URICUR, LYMEIGGIGMAB, LYMEABIGMQN, HLAB27   Coagulation Lab Results  Component Value Date   PLT 258 07/14/2018     Cardiovascular Lab Results  Component Value Date   HGB 16.7 07/14/2018   HCT 48.2 07/14/2018     Screening No results found for: SARSCOV2NAA, COVIDSOURCE, STAPHAUREUS, MRSAPCR, HCVAB, HIV, PREGTESTUR   Cancer No results found for: CEA, CA125, LABCA2   Allergens No results found for: ALMOND, APPLE, ASPARAGUS, AVOCADO, BANANA, BARLEY,  BASIL, BAYLEAF, GREENBEAN, LIMABEAN, WHITEBEAN, BEEFIGE, REDBEET, BLUEBERRY, BROCCOLI, CABBAGE, MELON, CARROT, CASEIN, CASHEWNUT, CAULIFLOWER, CELERY     Note: Lab results reviewed.  Meds   Current Outpatient Medications:    ASTRAGALUS PO, Take by mouth., Disp: , Rfl:    benazepril-hydrochlorthiazide (LOTENSIN HCT) 20-12.5 MG tablet, Take by mouth., Disp: , Rfl:    Biotin 1000 MCG tablet, Take by mouth., Disp: , Rfl:    cyclobenzaprine (FLEXERIL) 10 MG tablet, Take 10 mg by mouth daily as needed., Disp: , Rfl:    gabapentin (NEURONTIN) 400 MG capsule, Take 400 mg by mouth 5 (five) times daily., Disp: , Rfl:    Loratadine 10 MG CAPS, Take 10 mg by mouth daily., Disp: , Rfl:    LORazepam (ATIVAN) 0.5 MG tablet, Take by mouth., Disp: , Rfl:    Multiple Vitamin (MULTI-VITAMINS) TABS, Take by mouth., Disp: , Rfl:    zolpidem (AMBIEN) 10 MG tablet, TAKE 1 TABLET BY MOUTH EVERY NIGHT AT BEDTIME AS NEEDED FOR SLEEP, Disp: 90 tablet, Rfl: 0   [START ON 05/13/2020] HYDROcodone-acetaminophen (NORCO) 10-325 MG tablet, Take 1 tablet by mouth every 6 (six) hours as needed for severe pain. Must last 30 days., Disp: 90 tablet, Rfl: 0   [START ON 06/12/2020] HYDROcodone-acetaminophen (NORCO) 10-325 MG tablet, Take 1 tablet by mouth  every 6 (six) hours as needed for severe pain. Must last 30 days., Disp: 90 tablet, Rfl: 0  ROS  Constitutional: Denies any fever or chills Gastrointestinal: No reported hemesis, hematochezia, vomiting, or acute GI distress Musculoskeletal: Denies any acute onset joint swelling, redness, loss of ROM, or weakness Neurological: No reported episodes of acute onset apraxia, aphasia, dysarthria, agnosia, amnesia, paralysis, loss of coordination, or loss of consciousness  Allergies  Mr. Gleed is allergic to prednisone.  PFSH  Drug: Mr. Pudlo  reports no history of drug use. Alcohol:  reports current alcohol use. Tobacco:  reports that he quit smoking about 35 years ago.  He has never used smokeless tobacco. Medical:  has a past medical history of High cholesterol, Hypertension, and Sinus trouble. Surgical: Mr. Weiand  has a past surgical history that includes right knee replaced (2007); Knee arthroscopy; L1-L4 fuzed; Joint replacement (Right); Back surgery; Colonoscopy with propofol (N/A, 11/22/2017); Cholecystectomy; and basal cell removal. Family: Family history is unknown by patient.  Constitutional Exam  General appearance: Well nourished, well developed, and well hydrated. In no apparent acute distress Vitals:   04/15/20 1455  BP: (!) 142/91  Pulse: 75  Resp: 16  Temp: 98.1 F (36.7 C)  TempSrc: Temporal  SpO2: 100%  Weight: 230 lb (104.3 kg)  Height: 6' (1.829 m)   BMI Assessment: Estimated body mass index is 31.19 kg/m as calculated from the following:   Height as of this encounter: 6' (1.829 m).   Weight as of this encounter: 230 lb (104.3 kg).  BMI interpretation table: BMI level Category Range association with higher incidence of chronic pain  <18 kg/m2 Underweight   18.5-24.9 kg/m2 Ideal body weight   25-29.9 kg/m2 Overweight Increased incidence by 20%  30-34.9 kg/m2 Obese (Class I) Increased incidence by 68%  35-39.9 kg/m2 Severe obesity (Class II) Increased incidence by 136%  >40 kg/m2 Extreme obesity (Class III) Increased incidence by 254%   Patient's current BMI Ideal Body weight  Body mass index is 31.19 kg/m. Ideal body weight: 77.6 kg (171 lb 1.2 oz) Adjusted ideal body weight: 88.3 kg (194 lb 10.3 oz)   BMI Readings from Last 4 Encounters:  04/15/20 31.19 kg/m  04/01/20 32.22 kg/m  12/18/18 32.77 kg/m  07/14/18 31.87 kg/m   Wt Readings from Last 4 Encounters:  04/15/20 230 lb (104.3 kg)  04/01/20 231 lb (104.8 kg)  12/18/18 241 lb 9.6 oz (109.6 kg)  07/14/18 235 lb (106.6 kg)    Psych/Mental status: Alert, oriented x 3 (person, place, & time)       Eyes: PERLA Respiratory: No evidence of acute respiratory  distress  Cervical Spine Exam  Skin & Axial Inspection: No masses, redness, edema, swelling, or associated skin lesions Alignment: Symmetrical Functional ROM: Unrestricted ROM      Stability: No instability detected Muscle Tone/Strength: Functionally intact. No obvious neuro-muscular anomalies detected. Sensory (Neurological): Unimpaired Palpation: No palpable anomalies              Upper Extremity (UE) Exam    Side: Right upper extremity  Side: Left upper extremity  Skin & Extremity Inspection: Skin color, temperature, and hair growth are WNL. No peripheral edema or cyanosis. No masses, redness, swelling, asymmetry, or associated skin lesions. No contractures.  Skin & Extremity Inspection: Skin color, temperature, and hair growth are WNL. No peripheral edema or cyanosis. No masses, redness, swelling, asymmetry, or associated skin lesions. No contractures.  Functional ROM: Unrestricted ROM  Functional ROM: Unrestricted ROM          Muscle Tone/Strength: Functionally intact. No obvious neuro-muscular anomalies detected.   Muscle Tone/Strength: Functionally intact. No obvious neuro-muscular anomalies detected.  Sensory (Neurological): Unimpaired          Sensory (Neurological): Unimpaired          Palpation: No palpable anomalies              Palpation: No palpable anomalies              Provocative Test(s):  Phalen's test: deferred Tinel's test: deferred Apley's scratch test (touch opposite shoulder):  Action 1 (Across chest): deferred Action 2 (Overhead): deferred Action 3 (LB reach): deferred   Provocative Test(s):  Phalen's test: deferred Tinel's test: deferred Apley's scratch test (touch opposite shoulder):  Action 1 (Across chest): deferred Action 2 (Overhead): deferred Action 3 (LB reach): deferred    Thoracic Spine Area Exam  Skin & Axial Inspection: No masses, redness, or swelling Alignment: Symmetrical Functional ROM: Unrestricted ROM Stability: No instability  detected Muscle Tone/Strength: Functionally intact. No obvious neuro-muscular anomalies detected. Sensory (Neurological): Unimpaired Muscle strength & Tone: No palpable anomalies  Lumbar Exam  Skin & Axial Inspection: Well healed scar from previous spine surgery detected Alignment: Symmetrical Functional ROM: Pain restricted ROM affecting both sides Stability: No instability detected Muscle Tone/Strength: Functionally intact. No obvious neuro-muscular anomalies detected. Sensory (Neurological): Musculoskeletal pain pattern dermatomal pain pattern on the right  Provocative Tests: Hyperextension/rotation test: (+) bilaterally for facet joint pain. Lumbar quadrant test (Kemp's test): (+) bilaterally for facet joint pain. Lateral bending test: (+) ipsilateral radicular pain, on the right. Positive for right-sided foraminal stenosis. Patrick's Maneuver: deferred today                   FABER* test: deferred today                   S-I anterior distraction/compression test: deferred today         S-I lateral compression test: deferred today         S-I Thigh-thrust test: deferred today         S-I Gaenslen's test: deferred today         *(Flexion, ABduction and External Rotation)  Gait & Posture Assessment  Ambulation: Limited Gait: Antalgic Posture: Difficulty standing up straight, due to pain   Lower Extremity Exam    Side: Right lower extremity  Side: Left lower extremity  Stability: No instability observed          Stability: No instability observed          Skin & Extremity Inspection: Evidence of prior right knee arthroplastic surgery, right knee in brace  Skin & Extremity Inspection: Skin color, temperature, and hair growth are WNL. No peripheral edema or cyanosis. No masses, redness, swelling, asymmetry, or associated skin lesions. No contractures.  Functional ROM: Pain restricted ROM for knee joint          Functional ROM: Unrestricted ROM                  Muscle  Tone/Strength: Functionally intact. No obvious neuro-muscular anomalies detected.  Muscle Tone/Strength: Functionally intact. No obvious neuro-muscular anomalies detected.  Sensory (Neurological): Arthropathic arthralgia        Sensory (Neurological): Unimpaired        DTR: Patellar: deferred today Achilles: deferred today Plantar: deferred today  DTR: Patellar: deferred today Achilles: deferred today Plantar: deferred today  Palpation: No palpable anomalies  Palpation: No palpable anomalies     Assessment & Plan  Primary Diagnosis & Pertinent Problem List: The primary encounter diagnosis was Chronic radicular lumbar pain. Diagnoses of History of lumbar fusion (L1-L3), Lumbar facet arthropathy, History of total right knee replacement, Chronic bilateral low back pain with bilateral sciatica, Other intervertebral disc degeneration, lumbar region, Post laminectomy syndrome, and Chronic pain syndrome were also pertinent to this visit.  Visit Diagnosis: 1. Chronic radicular lumbar pain   2. History of lumbar fusion (L1-L3)   3. Lumbar facet arthropathy   4. History of total right knee replacement   5. Chronic bilateral low back pain with bilateral sciatica   6. Other intervertebral disc degeneration, lumbar region   7. Post laminectomy syndrome   8. Chronic pain syndrome    Plan of Care  Pharmacotherapy (Medications Ordered): Meds ordered this encounter  Medications   HYDROcodone-acetaminophen (NORCO) 10-325 MG tablet    Sig: Take 1 tablet by mouth every 6 (six) hours as needed for severe pain. Must last 30 days.    Dispense:  90 tablet    Refill:  0    Chronic Pain. (STOP Act - Not applicable). Fill one day early if closed on scheduled refill date.   HYDROcodone-acetaminophen (NORCO) 10-325 MG tablet    Sig: Take 1 tablet by mouth every 6 (six) hours as needed for severe pain. Must last 30 days.    Dispense:  90 tablet    Refill:  0    Chronic Pain. (STOP Act - Not  applicable). Fill one day early if closed on scheduled refill date.    Continue Flexeril and gabapentin as prescribed.  Patient has discontinued nortriptyline as it caused cognitive side effects.  We will hopefully set up peer to peer to have his lumbar MRI approved.  Pharmacological management options:  Opioid Analgesics: We'll take over management today. See above orders Membrane stabilizer: Currently on a membrane stabilizer Muscle relaxant: Currently on a muscle relaxant NSAID: Trial discussed. Other analgesic(s): To be determined at a later time   Considering:   After lumbar MRI, patient could be a candidate for spinal cord stimulator trial   PRN Procedures:   None at this time    Provider-requested follow-up: Return in about 3 months (around 07/10/2020) for Medication Management, in person.  I spent a total of 30 minutes reviewing chart data, face-to-face evaluation with the patient, counseling and coordination of care as detailed above.   Recent Visits Date Type Provider Dept  04/01/20 Office Visit Gillis Santa, MD [facility]  Showing recent visits within past 90 days and meeting all other requirements Today's Visits Date Type Provider Dept  04/15/20 Office Visit Gillis Santa, MD [facility]  Showing today's visits and meeting all other requirements Future Appointments No visits were found meeting these conditions. Showing future appointments within next 90 days and meeting all other requirements  Primary Care Physician: Juluis Pitch, MD Note by: Gillis Santa, MD Date: 04/15/2020; Time: 3:27 PM

## 2020-05-15 ENCOUNTER — Encounter: Payer: Self-pay | Admitting: Student in an Organized Health Care Education/Training Program

## 2020-05-17 ENCOUNTER — Telehealth: Payer: Self-pay

## 2020-05-17 DIAGNOSIS — G894 Chronic pain syndrome: Secondary | ICD-10-CM

## 2020-05-17 NOTE — Telephone Encounter (Signed)
For his MRI he will need a creatinine lab done since his MRI is with and without contrast. Please order

## 2020-05-17 NOTE — Telephone Encounter (Signed)
Notified patient to get labs before his MRI

## 2020-05-18 ENCOUNTER — Other Ambulatory Visit
Admission: RE | Admit: 2020-05-18 | Discharge: 2020-05-18 | Disposition: A | Discharge status: Home / Self Care | Payer: Medicare HMO | Source: Ambulatory Visit | Attending: Student in an Organized Health Care Education/Training Program | Admitting: Student in an Organized Health Care Education/Training Program

## 2020-05-18 DIAGNOSIS — M5416 Radiculopathy, lumbar region: Secondary | ICD-10-CM | POA: Diagnosis present

## 2020-05-18 LAB — CREATININE, SERUM
Creatinine, Ser: 0.98 mg/dL (ref 0.61–1.24)
GFR, Estimated: >60 mL/min (ref >60)

## 2020-05-18 LAB — BUN: BUN: 14 mg/dL (ref 8–23)

## 2020-05-26 ENCOUNTER — Other Ambulatory Visit: Payer: Self-pay

## 2020-05-26 ENCOUNTER — Ambulatory Visit
Admission: RE | Admit: 2020-05-26 | Discharge: 2020-05-26 | Disposition: A | Discharge status: Home / Self Care | Payer: Medicare HMO | Source: Ambulatory Visit | Attending: Student in an Organized Health Care Education/Training Program | Admitting: Student in an Organized Health Care Education/Training Program

## 2020-05-26 DIAGNOSIS — G8929 Other chronic pain: Secondary | ICD-10-CM | POA: Insufficient documentation

## 2020-05-26 DIAGNOSIS — G894 Chronic pain syndrome: Secondary | ICD-10-CM | POA: Diagnosis present

## 2020-05-26 DIAGNOSIS — M5136 Other intervertebral disc degeneration, lumbar region: Secondary | ICD-10-CM | POA: Diagnosis present

## 2020-05-26 DIAGNOSIS — M961 Postlaminectomy syndrome, not elsewhere classified: Secondary | ICD-10-CM | POA: Diagnosis present

## 2020-05-26 DIAGNOSIS — Z981 Arthrodesis status: Secondary | ICD-10-CM

## 2020-05-26 DIAGNOSIS — M5416 Radiculopathy, lumbar region: Secondary | ICD-10-CM | POA: Diagnosis present

## 2020-05-26 IMAGING — MR MR LUMBAR SPINE WO/W CM
4 of 7 series · 26 of 48 positions shown · IV contrast (gadavist)
Comparison: [DATE] CT lumbar spine.

CLINICAL DATA: Osteoarthritis, lumbosacral Low back pain, > 6 wks
Lumbar radiculopathy, > 6 wks

EXAM:
MRI LUMBAR SPINE WITHOUT AND WITH CONTRAST
TECHNIQUE: Multiplanar and multiecho pulse sequences of the lumbar spine were
obtained without and with intravenous contrast.
CONTRAST:  10mL GADAVIST GADOBUTROL 1 MMOL/ML IV SOLN

[Series 2: T2 · sagittal · 4.0mm · 0.81mm/px · 3 of 15 slices shown (1 of 2)]
[im 1/15]
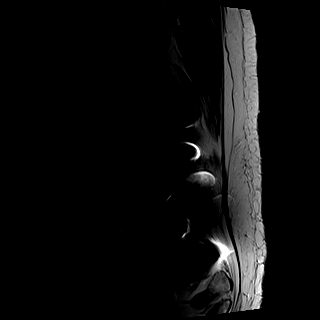
[im 8/15]
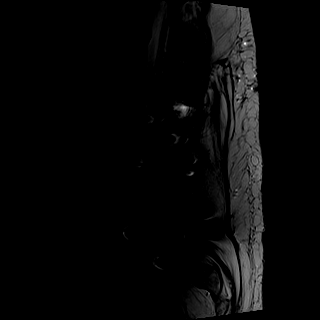
[im 15/15]
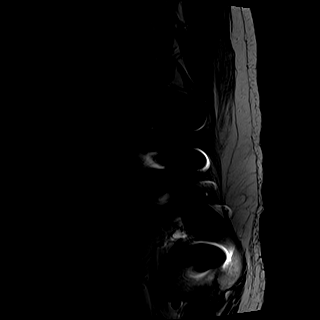

[Series 3: T1 · sagittal · 4.0mm · 0.41mm/px · 4 of 15 slices shown (1 of 2)]
[im 1/15]
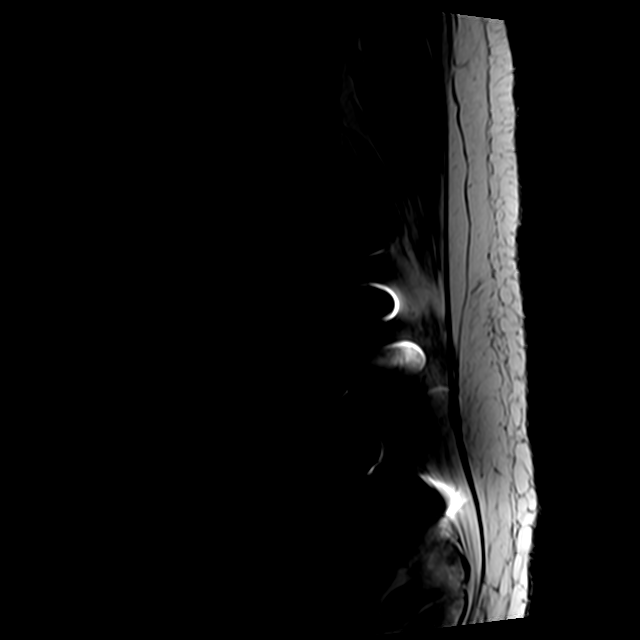
[im 5/15]
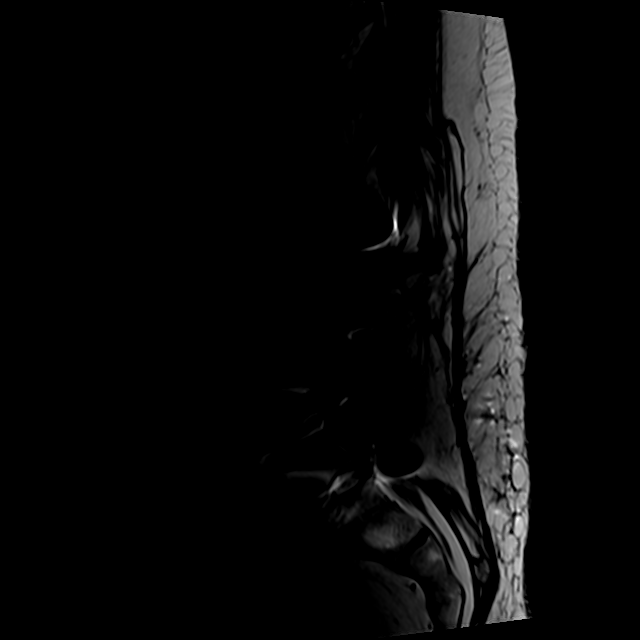
[im 10/15]
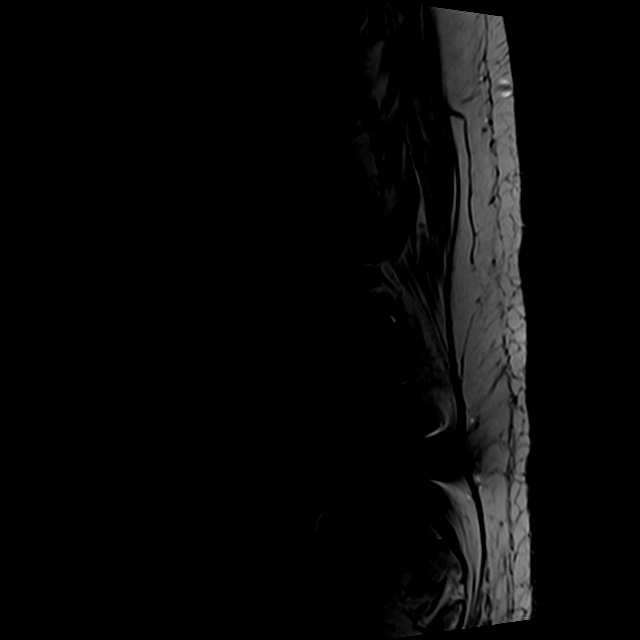
[im 15/15]
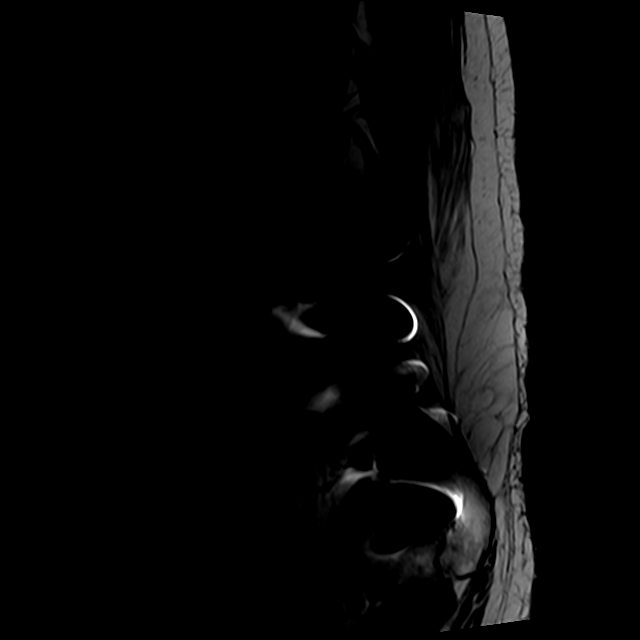

[Series 5: T2 · axial · 4.0mm · 0.78mm/px · z∈[-177,+53]mm · 11 of 40 slices shown (2 of 2)]
[im 1/40]
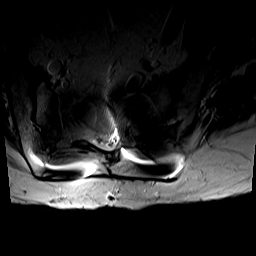
[im 4/40]
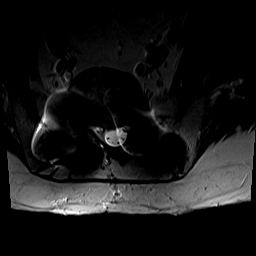
[im 8/40]
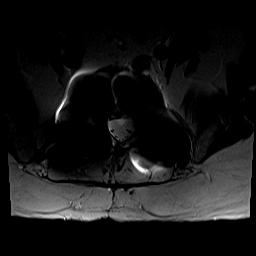
[im 12/40]
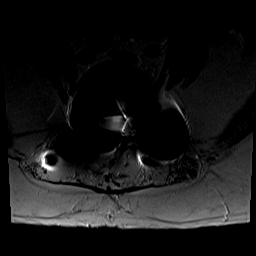
[im 16/40]
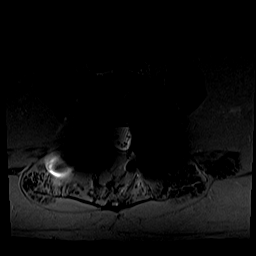
[im 20/40]
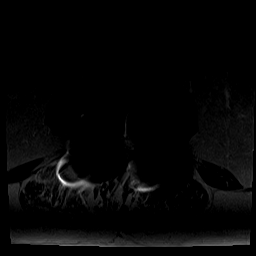
[im 24/40]
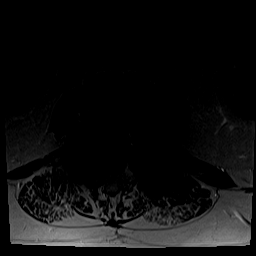
[im 28/40]
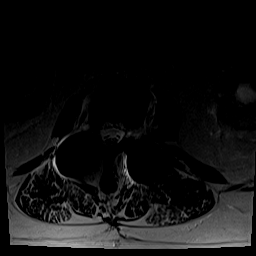
[im 32/40]
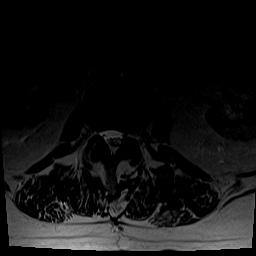
[im 36/40]
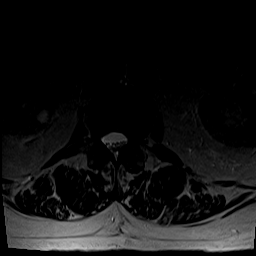
[im 40/40]
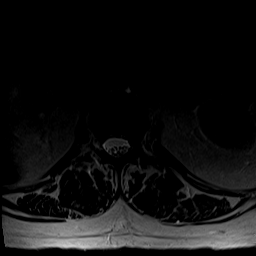

[Series 6: T1 · axial · 4.0mm · 0.39mm/px · z∈[-177,+33]mm · 8 of 40 slices shown (2 of 2)]
[im 1/40]
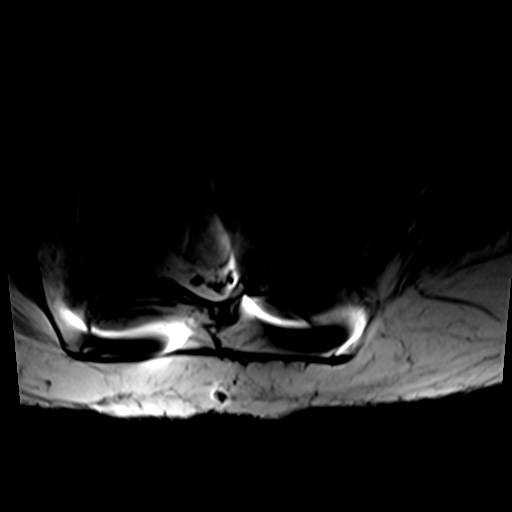
[im 4/40]
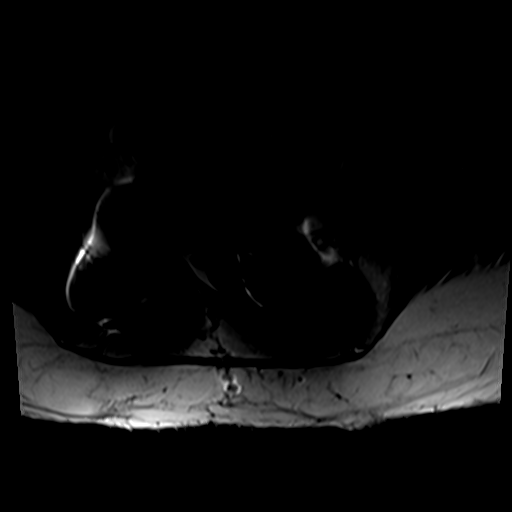
[im 8/40]
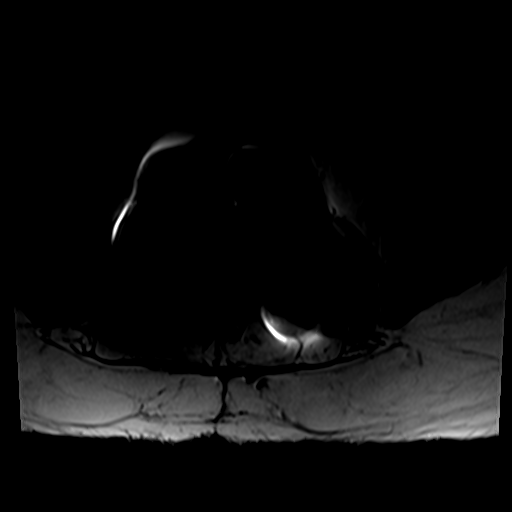
[im 12/40]
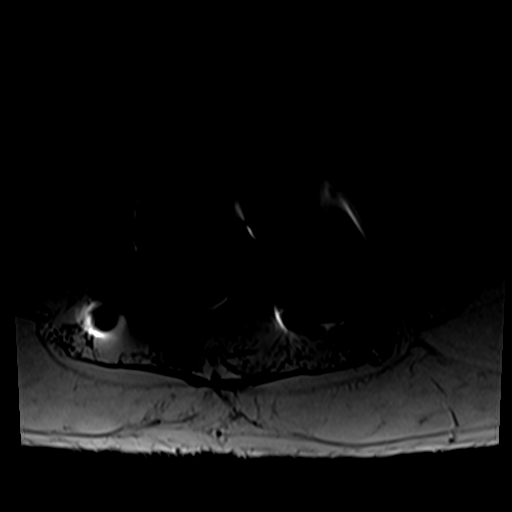
[im 16/40]
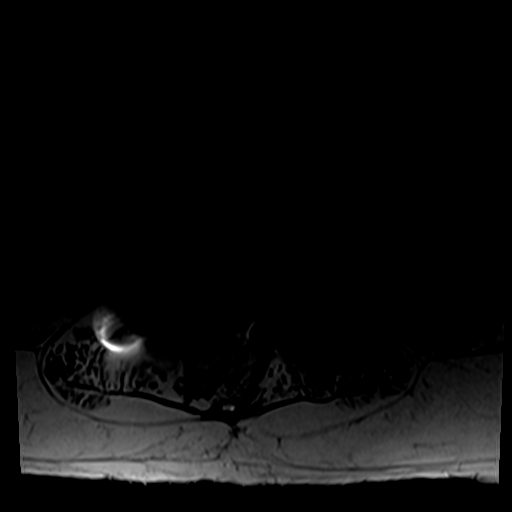
[im 20/40]
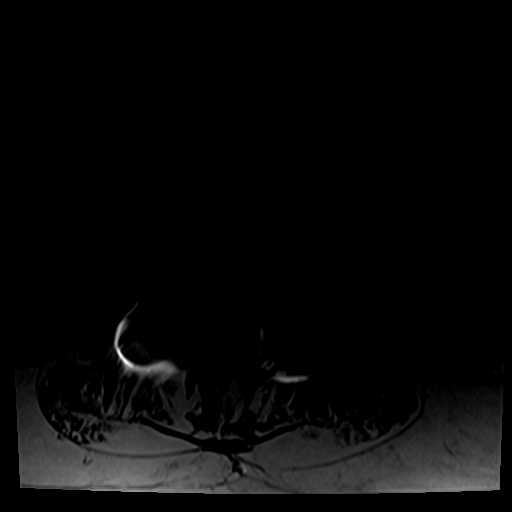
[im 24/40]
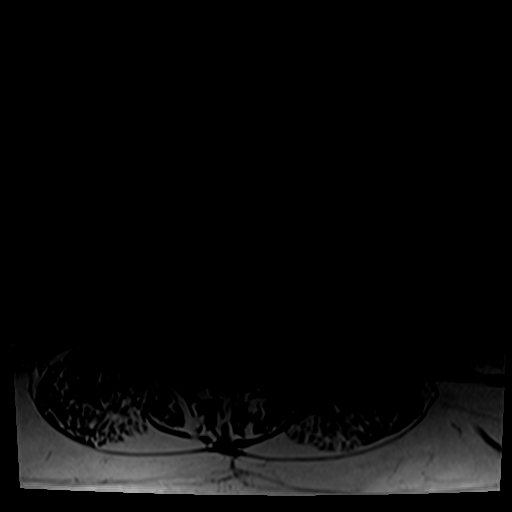
[im 36/40]
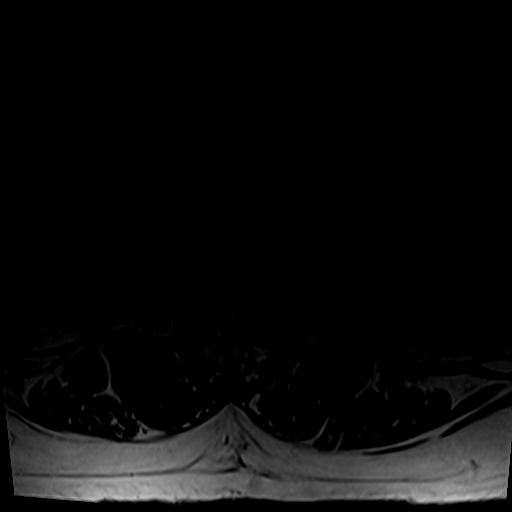

[26 of 48 positions shown; findings below may reference images not displayed]

[DATE] lumbar spine
myelogram and prior. [DATE] MRI lumbar spine report and prior.
FINDINGS: Segmentation:  Standard.

Alignment: Normal alignment. Sequela of posterior lumbosacral fusion
with hardware spanning the L3-S1 levels. Associated susceptibility
artifact limits evaluation.

Vertebrae: Vertebral body heights are preserved. Multilevel Modic
type 2 endplate degenerative changes with prominent superior L2
endplate Schmorl's node formation.

Conus medullaris and cauda equina: Conus extends to the T12 level.
Conus and cauda equina appear normal.

Disc levels: Multilevel desiccation.

L1-2: Mild disc bulge with shallow left foraminal protrusion.
Bilateral facet hypertrophy. Patent spinal canal and neural foramen.

L2-3: Disc bulge with shallow central protrusion. Bilateral facet
hypertrophy and prominent ligamentum flavum. Mild spinal canal, mild
left and moderate right neural foraminal narrowing.

L3-4: Sequela of fusion.  Patent spinal canal and neural foramen.

L4-5: Sequela of fusion.  Patent spinal canal and neural foramen.

L5-S1: Sequela of fusion.  Patent spinal canal and neural foramen.

Paraspinal and other soft tissues: Bilateral renal cysts.
IMPRESSION: 1. Sequela of posterior spinal fusion with hardware spanning the
L3-S1 levels. Patent spinal canal and neural foramen at these levels
however susceptibility artifact limits visualization.
2. Multilevel spondylosis. Mild spinal canal, moderate right and
mild left neural foraminal narrowing at the L2-3 level.

## 2020-05-26 MED ORDER — GADOBUTROL 1 MMOL/ML IV SOLN
10.0000 mL | Freq: Once | INTRAVENOUS | Status: AC | PRN
  Administered 2020-05-26: 10 mL via INTRAVENOUS

## 2020-05-31 NOTE — Telephone Encounter (Signed)
Patient is requesting refill on Ambien 10mg . Last refilled 04/12/2020 #90 with 0 refills.  Last OV 08/04/2019 with pending recall for 07/2020

## 2020-06-01 ENCOUNTER — Encounter: Payer: Self-pay | Admitting: Student in an Organized Health Care Education/Training Program

## 2020-06-02 ENCOUNTER — Other Ambulatory Visit: Payer: Self-pay | Admitting: Internal Medicine

## 2020-06-02 ENCOUNTER — Encounter: Payer: Self-pay | Admitting: *Deleted

## 2020-06-02 DIAGNOSIS — F5101 Primary insomnia: Secondary | ICD-10-CM

## 2020-06-02 NOTE — Telephone Encounter (Signed)
Dr. Mortimer Fries, please advise.  Last refilled on 04/12/2020 #90 with 0 refills.  pending recall for 07/2020

## 2020-06-07 ENCOUNTER — Other Ambulatory Visit: Payer: Self-pay | Admitting: Student in an Organized Health Care Education/Training Program

## 2020-06-07 DIAGNOSIS — G5701 Lesion of sciatic nerve, right lower limb: Secondary | ICD-10-CM

## 2020-06-07 DIAGNOSIS — G894 Chronic pain syndrome: Secondary | ICD-10-CM

## 2020-07-08 ENCOUNTER — Other Ambulatory Visit: Payer: Self-pay

## 2020-07-08 ENCOUNTER — Ambulatory Visit
Payer: Medicare HMO | Attending: Student in an Organized Health Care Education/Training Program | Admitting: Student in an Organized Health Care Education/Training Program

## 2020-07-08 ENCOUNTER — Encounter: Payer: Self-pay | Admitting: Student in an Organized Health Care Education/Training Program

## 2020-07-08 VITALS — BP 126/93 | HR 67 | Temp 97.2°F | Ht 72.0 in | Wt 225.0 lb

## 2020-07-08 DIAGNOSIS — Z981 Arthrodesis status: Secondary | ICD-10-CM | POA: Insufficient documentation

## 2020-07-08 DIAGNOSIS — G894 Chronic pain syndrome: Secondary | ICD-10-CM | POA: Insufficient documentation

## 2020-07-08 DIAGNOSIS — M47816 Spondylosis without myelopathy or radiculopathy, lumbar region: Secondary | ICD-10-CM | POA: Diagnosis not present

## 2020-07-08 DIAGNOSIS — G8929 Other chronic pain: Secondary | ICD-10-CM | POA: Insufficient documentation

## 2020-07-08 DIAGNOSIS — M5416 Radiculopathy, lumbar region: Secondary | ICD-10-CM | POA: Insufficient documentation

## 2020-07-08 DIAGNOSIS — M961 Postlaminectomy syndrome, not elsewhere classified: Secondary | ICD-10-CM | POA: Insufficient documentation

## 2020-07-08 DIAGNOSIS — G5701 Lesion of sciatic nerve, right lower limb: Secondary | ICD-10-CM | POA: Insufficient documentation

## 2020-07-08 DIAGNOSIS — Z96651 Presence of right artificial knee joint: Secondary | ICD-10-CM | POA: Diagnosis present

## 2020-07-08 MED ORDER — HYDROCODONE-ACETAMINOPHEN 10-325 MG PO TABS: 1.0000 | ORAL_TABLET | Freq: Four times a day (QID) | ORAL | 0 refills | Status: DC | PRN

## 2020-07-08 NOTE — Progress Notes (Signed)
PROVIDER NOTE: Information contained herein reflects review and annotations entered in association with encounter. Interpretation of such information and data should be left to medically-trained personnel. Information provided to patient can be located elsewhere in the medical record under "Patient Instructions". Document created using STT-dictation technology, any transcriptional errors that may result from process are unintentional.    Patient: Perry Jones  Service Category: E/M  Provider: Gillis Santa, MD  DOB: 09/01/1950  DOS: 07/08/2020  Specialty: Interventional Pain Management  MRN: [id number]  Setting: Ambulatory outpatient  PCP: Juluis Pitch, MD  Type: Established Patient    Referring Provider: Juluis Pitch, MD  Location: Office  Delivery: Face-to-face     HPI  Perry Jones, a 70 y.o. year old male, is here today because of his Chronic radicular lumbar pain [M54.16, G89.29]. Perry Jones primary complain today is Back Pain Last encounter: My last encounter with him was on 04/15/2020. Pertinent problems: Perry Jones has Back muscle spasm; Chronic knee pain after total replacement of right knee joint; DDD (degenerative disc disease), lumbar; Essential hypertension; Chronic radicular lumbar pain; History of total right knee replacement; History of lumbar fusion (L1-L3); Chronic bilateral low back pain with bilateral sciatica; Chronic pain syndrome; Lumbar facet arthropathy; and Piriformis syndrome, right on their pertinent problem list. Pain Assessment: Severity of Chronic pain is reported as a 8 /10. Location: Back  /pain radiaties down his leg. Onset: More than a month ago. Quality: Aching,Tingling,Contraction. Timing: Constant. Modifying factor(s): ice, meds massage. Vitals:  height is 6' (1.829 m) and weight is 225 lb (102.1 kg). His temperature is 97.2 F (36.2 C) (abnormal). His blood pressure is 126/93 (abnormal) and his pulse is 67. His oxygen saturation is 97%.   Reason  for encounter: medication management.  And to review his lumbar MRI   No change in medical history since last visit.  Patient's pain is at baseline.  Patient continues multimodal pain regimen as prescribed.  States that it provides pain relief and improvement in functional status. Here to review lumbar MRI and discuss further treatment plan.  Pharmacotherapy Assessment   Analgesic: Hydrocodone 10 mg 3 times daily as needed, quantity 90/month; MME equals 30    Monitoring: [place] PMP: PDMP reviewed during this encounter.       Pharmacotherapy: No side-effects or adverse reactions reported. Compliance: No problems identified. Effectiveness: Clinically acceptable.  Chauncey Fischer, RN  07/08/2020  8:47 AM  Sign when Signing Visit Nursing Pain Medication Assessment:  Safety precautions to be maintained throughout the outpatient stay will include: orient to surroundings, keep bed in low position, maintain call bell within reach at all times, provide assistance with transfer out of bed and ambulation.  Medication Inspection Compliance: Perry Jones did not comply with our request to bring his pills to be counted. He was reminded that bringing the medication bottles, even when empty, is a requirement.  Medication: None brought in. Pill/Patch Count: None available to be counted. Bottle Appearance: No container available. Did not bring bottle(s) to appointment. Filled Date: N/A Last Medication intake:  YesterdaySafety precautions to be maintained throughout the outpatient stay will include: orient to surroundings, keep bed in low position, maintain call bell within reach at all times, provide assistance with transfer out of bed and ambulation.     UDS:  Summary  Date Value Ref Range Status  04/01/2020 Note  Final    Comment:    ==================================================================== Compliance Drug Analysis, Ur ==================================================================== Test  Result       Flag       Units  Drug Present and Declared for Prescription Verification   Lorazepam                      54           EXPECTED   ng/mg creat    Source of lorazepam is a scheduled prescription medication.    Hydrocodone                    913          EXPECTED   ng/mg creat   Hydromorphone                  698          EXPECTED   ng/mg creat   Dihydrocodeine                 69           EXPECTED   ng/mg creat   Norhydrocodone                 1562         EXPECTED   ng/mg creat    Sources of hydrocodone include scheduled prescription medications.    Hydromorphone, dihydrocodeine and norhydrocodone are expected    metabolites of hydrocodone. Hydromorphone and dihydrocodeine are    also available as scheduled prescription medications.    Gabapentin                     PRESENT      EXPECTED   Orphenadrine                   PRESENT      EXPECTED   Zolpidem                       PRESENT      EXPECTED   Zolpidem Acid                  PRESENT      EXPECTED    Zolpidem acid is an expected metabolite of zolpidem.    Acetaminophen                  PRESENT      EXPECTED  Drug Absent but Declared for Prescription Verification   Cyclobenzaprine                Not Detected UNEXPECTED   Nortriptyline                  Not Detected UNEXPECTED ==================================================================== Test                      Result    Flag   Units      Ref Range   Creatinine              128              mg/dL      >=20 ==================================================================== Declared Medications:  The flagging and interpretation on this report are based on the  following declared medications.  Unexpected results may arise from  inaccuracies in the declared medications.   **Note: The testing scope of this panel includes these medications:   Cyclobenzaprine (Flexeril)  Gabapentin (Neurontin)  Hydrocodone (Norco)  Lorazepam (Ativan)   Nortriptyline (Pamelor)  Orphenadrine (  Norflex)   **Note: The testing scope of this panel does not include small to  moderate amounts of these reported medications:   Acetaminophen (Norco)  Zolpidem (Ambien)   **Note: The testing scope of this panel does not include the  following reported medications:   Benazepril  Biotin  Hydrochlorothiazide  Loratadine  Multivitamin  Supplement ==================================================================== For clinical consultation, please call [phone]. ====================================================================      ROS  Constitutional: Denies any fever or chills Gastrointestinal: No reported hemesis, hematochezia, vomiting, or acute GI distress Musculoskeletal: Bilateral low back and right greater than left hip and proximal leg pain Neurological: No reported episodes of acute onset apraxia, aphasia, dysarthria, agnosia, amnesia, paralysis, loss of coordination, or loss of consciousness  Medication Review  Astragalus, Biotin, HYDROcodone-acetaminophen, LORazepam, Loratadine, Multi-Vitamins, benazepril-hydrochlorthiazide, cyclobenzaprine, gabapentin, and zolpidem  History Review  Allergy: Perry Jones is allergic to prednisone. Drug: Perry Jones  reports no history of drug use. Alcohol:  reports current alcohol use. Tobacco:  reports that he quit smoking about 35 years ago. He has never used smokeless tobacco. Social: Perry Jones  reports that he quit smoking about 35 years ago. He has never used smokeless tobacco. He reports current alcohol use. He reports that he does not use drugs. Medical:  has a past medical history of High cholesterol, Hypertension, and Sinus trouble. Surgical: Perry Jones  has a past surgical history that includes right knee replaced (2007); Knee arthroscopy; L1-L4 fuzed; Joint replacement (Right); Back surgery; Colonoscopy with propofol (N/A, 11/22/2017); Cholecystectomy; and basal cell removal. Family:  Family history is unknown by patient.  Laboratory Chemistry Profile   Renal Lab Results  Component Value Date   BUN 14 05/18/2020   CREATININE 0.98 05/18/2020   GFRAA >60 07/14/2018   GFRNONAA >60 05/18/2020     Hepatic Lab Results  Component Value Date   AST 33 07/14/2018   ALT 38 07/14/2018   ALBUMIN 4.3 07/14/2018   ALKPHOS 62 07/14/2018   LIPASE 28 07/14/2018     Electrolytes Lab Results  Component Value Date   NA 136 07/14/2018   K 3.3 (L) 07/14/2018   CL 99 07/14/2018   CALCIUM 9.8 07/14/2018     Bone No results found for: VD25OH, VD125OH2TOT, GP4982ME1, RA3094MH6, 25OHVITD1, 25OHVITD2, 25OHVITD3, TESTOFREE, TESTOSTERONE   Inflammation (CRP: Acute Phase) (ESR: Chronic Phase) No results found for: CRP, ESRSEDRATE, LATICACIDVEN     Note: Above Lab results reviewed.  Recent Imaging Review  MR LUMBAR SPINE W WO CONTRAST CLINICAL DATA:  Osteoarthritis, lumbosacral Low back pain, > 6 wks Lumbar radiculopathy, > 6 wks  EXAM: MRI LUMBAR SPINE WITHOUT AND WITH CONTRAST  TECHNIQUE: Multiplanar and multiecho pulse sequences of the lumbar spine were obtained without and with intravenous contrast.  CONTRAST:  64m GADAVIST GADOBUTROL 1 MMOL/ML IV SOLN  COMPARISON:  12/06/2009 CT lumbar spine. 12/06/2009 lumbar spine myelogram and prior. 04/19/1998 MRI lumbar spine report and prior.  FINDINGS: Segmentation:  Standard.  Alignment: Normal alignment. Sequela of posterior lumbosacral fusion with hardware spanning the L3-S1 levels. Associated susceptibility artifact limits evaluation.  Vertebrae: Vertebral body heights are preserved. Multilevel Modic type 2 endplate degenerative changes with prominent superior L2 endplate Schmorl's node formation.  Conus medullaris and cauda equina: Conus extends to the T12 level. Conus and cauda equina appear normal.  Disc levels: Multilevel desiccation.  L1-2: Mild disc bulge with shallow left foraminal  protrusion. Bilateral facet hypertrophy. Patent spinal canal and neural foramen.  L2-3: Disc bulge with shallow central protrusion. Bilateral facet  hypertrophy and prominent ligamentum flavum. Mild spinal canal, mild left and moderate right neural foraminal narrowing.  L3-4: Sequela of fusion.  Patent spinal canal and neural foramen.  L4-5: Sequela of fusion.  Patent spinal canal and neural foramen.  L5-S1: Sequela of fusion.  Patent spinal canal and neural foramen.  Paraspinal and other soft tissues: Bilateral renal cysts.  IMPRESSION: 1. Sequela of posterior spinal fusion with hardware spanning the L3-S1 levels. Patent spinal canal and neural foramen at these levels however susceptibility artifact limits visualization. 2. Multilevel spondylosis. Mild spinal canal, moderate right and mild left neural foraminal narrowing at the L2-3 level.  Electronically Signed   By: Primitivo Gauze M.D.   On: 05/26/2020 10:55 Note: Reviewed        Physical Exam  General appearance: Well nourished, well developed, and well hydrated. In no apparent acute distress Mental status: Alert, oriented x 3 (person, place, & time)       Respiratory: No evidence of acute respiratory distress Eyes: PERLA Vitals: BP (!) 126/93   Pulse 67   Temp (!) 97.2 F (36.2 C)   Ht 6' (1.829 m)   Wt 225 lb (102.1 kg)   SpO2 97%   BMI 30.52 kg/m  BMI: Estimated body mass index is 30.52 kg/m as calculated from the following:   Height as of this encounter: 6' (1.829 m).   Weight as of this encounter: 225 lb (102.1 kg). Ideal: Ideal body weight: 77.6 kg (171 lb 1.2 oz) Adjusted ideal body weight: 87.4 kg (192 lb 10.3 oz)  Lumbar Exam  Skin & Axial Inspection:Well healed scar from previous spine surgery detected Alignment:Symmetrical Functional DXI:PJAS restricted ROMaffecting both sides Stability:No instability detected Muscle Tone/Strength:Functionally intact. No obvious neuro-muscular anomalies  detected. Sensory (Neurological):Musculoskeletal pain patterndermatomal pain pattern on the right  Provocative Tests: Hyperextension/rotation test:Negative Lumbar quadrant test (Kemp's test):(+)bilaterally for facet joint pain. Lateral bending test:(+)ipsilateral radicular pain, on the right. Positive for right-sided foraminal stenosis.   Gait & Posture Assessment  Ambulation:Limited Gait:Antalgic Posture:Normal for his spinal fusion  Lower Extremity Exam    Side:Right lower extremity  Side:Left lower extremity  Stability:No instability observed  Stability:No instability observed  Skin & Extremity Inspection:Evidence of prior right knee arthroplastic surgery, right knee in brace  Skin & Extremity Inspection:Skin color, temperature, and hair growth are WNL. No peripheral edema or cyanosis. No masses, redness, swelling, asymmetry, or associated skin lesions. No contractures.  Functional NKN:LZJQ restricted ROMfor knee joint   Functional BHA:[id number] ROM   Muscle Tone/Strength:Functionally intact. No obvious neuro-muscular anomalies detected.  Muscle Tone/Strength:Functionally intact. No obvious neuro-muscular anomalies detected.  Sensory (Neurological):Arthropathic arthralgia  Sensory (Neurological):Unimpaired  DTR: Patellar:deferred today Achilles:deferred today Plantar:deferred today  DTR: Patellar:deferred today Achilles:deferred today Plantar:deferred today  Palpation:No palpable anomalies  Palpation:No palpable anomalies      Assessment   Status Diagnosis  Stable Persistent Persistent 1. Chronic radicular lumbar pain   2. History of lumbar fusion (L1-L3)   3. Lumbar facet arthropathy   4. Piriformis syndrome, right   5. History of total right knee replacement   6. Chronic pain syndrome   7. Post laminectomy syndrome      Updated Problems: Problem  Piriformis  Syndrome, Right  History of lumbar fusion (L1-L3)  Chronic Bilateral Low Back Pain With Bilateral Sciatica  Chronic Pain Syndrome  Lumbar Facet Arthropathy  Chronic Knee Pain After Total Replacement of Right Knee Joint  Back Muscle Spasm  Ddd (Degenerative Disc Disease), Lumbar  Chronic Radicular Lumbar Pain  History of Total Right Knee Replacement  Essential Hypertension    Plan of Care   Perry Jones has a current medication list which includes the following long-term medication(s): benazepril-hydrochlorthiazide, gabapentin, [START ON 07/15/2020] hydrocodone-acetaminophen, [START ON 08/14/2020] hydrocodone-acetaminophen, [START ON 09/13/2020] hydrocodone-acetaminophen, loratadine, and zolpidem.  Pharmacotherapy (Medications Ordered): Meds ordered this encounter  Medications  . HYDROcodone-acetaminophen (NORCO) 10-325 MG tablet    Sig: Take 1 tablet by mouth every 6 (six) hours as needed for severe pain. Must last 30 days.    Dispense:  90 tablet    Refill:  0    Chronic Pain. (STOP Act - Not applicable). Fill one day early if closed on scheduled refill date.  Marland Kitchen HYDROcodone-acetaminophen (NORCO) 10-325 MG tablet    Sig: Take 1 tablet by mouth every 6 (six) hours as needed for severe pain. Must last 30 days.    Dispense:  90 tablet    Refill:  0    Chronic Pain. (STOP Act - Not applicable). Fill one day early if closed on scheduled refill date.  Marland Kitchen HYDROcodone-acetaminophen (NORCO) 10-325 MG tablet    Sig: Take 1 tablet by mouth every 6 (six) hours as needed for severe pain. Must last 30 days.    Dispense:  90 tablet    Refill:  0    Chronic Pain. (STOP Act - Not applicable). Fill one day early if closed on scheduled refill date.  Continue gabapentin as prescribed by neurology.  Continue Flexeril as needed   Follow-up for L2-L3 lumbar epidural nonsteroid injection.  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  L3-S1 lumbar spinal fusion.  He  does have facet hypertrophy and ligamentum flavum thickening at L2-L3 with right greater than left neuroforaminal narrowing.  Discussed lumbar epidural injection with saline and ropivacaine.  We will also evaluate the patient's interlaminar windows for potential spinal cord stimulator trial.  Orders:  Orders Placed This Encounter  Procedures  . Lumbar Epidural Injection    Standing Status:   Future    Standing Expiration Date:   08/08/2020    Scheduling Instructions:     Procedure: Interlaminar Lumbar Epidural Steroid injection (LESI)     @ L2/3 W/O STEROID (SALINE + Ropi)     Laterality: Midline     Sedation: Patient's choice.     Timeframe: ASAA    Order Specific Question:   Where will this procedure be performed?    Answer:   [facility]   Follow-up plan:   Return in about 1 week (around 07/15/2020) for L2/3 W/O STEROID (SALINE + Ropi).   Recent Visits Date Type Provider Dept  04/15/20 Office Visit Gillis Santa, MD [facility]  Showing recent visits within past 90 days and meeting all other requirements Today's Visits Date Type Provider Dept  07/08/20 Office Visit Gillis Santa, MD [facility]  Showing today's visits and meeting all other requirements Future Appointments Date Type Provider Dept  10/05/20 Appointment Gillis Santa, MD [facility]  Showing future appointments within next 90 days and meeting all other requirements  I discussed the assessment and treatment plan with the patient. The patient was provided an opportunity to ask questions and all were answered. The patient agreed with the plan and demonstrated an understanding of the instructions.  Patient advised to call back or seek an in-person evaluation if the symptoms or condition worsens.  Duration of encounter: 18mnutes.  Note by: BGillis Santa MD Date: 07/08/2020; Time:  9:46 AM

## 2020-07-08 NOTE — Progress Notes (Signed)
Nursing Pain Medication Assessment:  Safety precautions to be maintained throughout the outpatient stay will include: orient to surroundings, keep bed in low position, maintain call bell within reach at all times, provide assistance with transfer out of bed and ambulation.  Medication Inspection Compliance: Mr. Hurrell did not comply with our request to bring his pills to be counted. He was reminded that bringing the medication bottles, even when empty, is a requirement.  Medication: None brought in. Pill/Patch Count: None available to be counted. Bottle Appearance: No container available. Did not bring bottle(s) to appointment. Filled Date: N/A Last Medication intake:  YesterdaySafety precautions to be maintained throughout the outpatient stay will include: orient to surroundings, keep bed in low position, maintain call bell within reach at all times, provide assistance with transfer out of bed and ambulation.

## 2020-07-14 ENCOUNTER — Encounter: Payer: Self-pay | Admitting: Student in an Organized Health Care Education/Training Program

## 2020-07-20 ENCOUNTER — Encounter: Payer: Self-pay | Admitting: Student in an Organized Health Care Education/Training Program

## 2020-07-21 ENCOUNTER — Ambulatory Visit
Admission: RE | Admit: 2020-07-21 | Discharge: 2020-07-21 | Disposition: A | Discharge status: Home / Self Care | Payer: Medicare HMO | Source: Ambulatory Visit | Attending: Student in an Organized Health Care Education/Training Program | Admitting: Student in an Organized Health Care Education/Training Program

## 2020-07-21 ENCOUNTER — Ambulatory Visit (HOSPITAL_BASED_OUTPATIENT_CLINIC_OR_DEPARTMENT_OTHER): Payer: Medicare HMO | Admitting: Student in an Organized Health Care Education/Training Program

## 2020-07-21 ENCOUNTER — Encounter: Payer: Self-pay | Admitting: Student in an Organized Health Care Education/Training Program

## 2020-07-21 ENCOUNTER — Other Ambulatory Visit: Payer: Self-pay

## 2020-07-21 VITALS — BP 135/83 | HR 76 | Temp 97.7°F | Resp 15 | Ht 72.0 in | Wt 225.0 lb

## 2020-07-21 DIAGNOSIS — M5416 Radiculopathy, lumbar region: Secondary | ICD-10-CM | POA: Diagnosis not present

## 2020-07-21 DIAGNOSIS — G894 Chronic pain syndrome: Secondary | ICD-10-CM | POA: Insufficient documentation

## 2020-07-21 DIAGNOSIS — G8929 Other chronic pain: Secondary | ICD-10-CM | POA: Insufficient documentation

## 2020-07-21 DIAGNOSIS — Z981 Arthrodesis status: Secondary | ICD-10-CM | POA: Diagnosis present

## 2020-07-21 DIAGNOSIS — M546 Pain in thoracic spine: Secondary | ICD-10-CM

## 2020-07-21 DIAGNOSIS — M961 Postlaminectomy syndrome, not elsewhere classified: Secondary | ICD-10-CM | POA: Insufficient documentation

## 2020-07-21 MED ORDER — LIDOCAINE HCL (PF) 2 % IJ SOLN
INTRAMUSCULAR | Status: AC
  Filled 2020-07-21: qty 5

## 2020-07-21 MED ORDER — LIDOCAINE HCL 2 % IJ SOLN
20.0000 mL | Freq: Once | INTRAMUSCULAR | Status: AC
  Administered 2020-07-21: 100 mg
  Filled 2020-07-21: qty 20

## 2020-07-21 MED ORDER — ROPIVACAINE HCL 2 MG/ML IJ SOLN
2.0000 mL | Freq: Once | INTRAMUSCULAR | Status: AC
  Administered 2020-07-21: 10 mL via EPIDURAL

## 2020-07-21 MED ORDER — IOHEXOL 180 MG/ML  SOLN
10.0000 mL | Freq: Once | INTRAMUSCULAR | Status: AC
  Administered 2020-07-21: 20 mL via EPIDURAL

## 2020-07-21 MED ORDER — SODIUM CHLORIDE 0.9% FLUSH
2.0000 mL | Freq: Once | INTRAVENOUS | Status: AC
  Administered 2020-07-21: 10 mL

## 2020-07-21 MED ORDER — SODIUM CHLORIDE (PF) 0.9 % IJ SOLN
INTRAMUSCULAR | Status: AC
  Filled 2020-07-21: qty 10

## 2020-07-21 NOTE — Progress Notes (Signed)
Safety precautions to be maintained throughout the outpatient stay will include: orient to surroundings, keep bed in low position, maintain call bell within reach at all times, provide assistance with transfer out of bed and ambulation.  

## 2020-07-21 NOTE — Progress Notes (Deleted)
PROVIDER NOTE: Information contained herein reflects review and annotations entered in association with encounter. Interpretation of such information and data should be left to medically-trained personnel. Information provided to patient can be located elsewhere in the medical record under "Patient Instructions". Document created using STT-dictation technology, any transcriptional errors that may result from process are unintentional.    Patient: Perry Jones  Service Category: Procedure  Provider: Gillis Santa, MD  DOB: 1950-09-20  DOS: 07/21/2020  Location: [facility]  MRN: [id number]  Setting: Ambulatory - outpatient  Referring Provider: Gillis Santa, MD  Type: Established Patient  Specialty: Interventional Pain Management  PCP: Juluis Pitch, MD   Primary Reason for Visit: Interventional Pain Management Treatment. CC: Back Pain  Procedure:          Anesthesia, Analgesia, Anxiolysis:  Type: Therapeutic Inter-Laminar Epidural Injection  #1 without steroid Region: Lumbar Level: L4-5 Level. Laterality: Midline         Type: Local Anesthesia  Local Anesthetic: Lidocaine 1-2%  Position: Prone with head of the table was raised to facilitate breathing.   Indications: 1. Chronic radicular lumbar pain   2. Chronic pain syndrome    Pain Score: Pre-procedure: 5 /10 Post-procedure: 5 /10   Pre-op H&P Assessment:  Perry Jones is a 70 y.o. (year old), male patient, seen today for interventional treatment. He  has a past surgical history that includes right knee replaced (2007); Knee arthroscopy; L1-L4 fuzed; Joint replacement (Right); Back surgery; Colonoscopy with propofol (N/A, 11/22/2017); Cholecystectomy; and basal cell removal. Perry Jones has a current medication list which includes the following prescription(s): benazepril-hydrochlorthiazide, biotin, cyclobenzaprine, gabapentin, hydrocodone-acetaminophen, [START ON 08/14/2020] hydrocodone-acetaminophen, [START ON 09/13/2020]  hydrocodone-acetaminophen, loratadine, lorazepam, multi-vitamins, zolpidem, and astragalus, and the following Facility-Administered Medications: iohexol, lidocaine, ropivacaine (pf) 2 mg/ml (0.2%), and sodium chloride flush. His primarily concern today is the Back Pain  Initial Vital Signs:  Pulse/HCG Rate: 76  Temp: 97.7 F (36.5 C) Resp: 18 BP: 103/89 SpO2: 100 %  BMI: Estimated body mass index is 30.52 kg/m as calculated from the following:   Height as of this encounter: 6' (1.829 m).   Weight as of this encounter: 225 lb (102.1 kg).  Risk Assessment: Allergies: Reviewed. He is allergic to prednisone.  Allergy Precautions: None required Coagulopathies: Reviewed. None identified.  Blood-thinner therapy: None at this time Active Infection(s): Reviewed. None identified. Perry Jones is afebrile  Site Confirmation: Perry Jones was asked to confirm the procedure and laterality before marking the site Procedure checklist: Completed Consent: Before the procedure and under the influence of no sedative(s), amnesic(s), or anxiolytics, the patient was informed of the treatment options, risks and possible complications. To fulfill our ethical and legal obligations, as recommended by the American Medical Association's Code of Ethics, I have informed the patient of my clinical impression; the nature and purpose of the treatment or procedure; the risks, benefits, and possible complications of the intervention; the alternatives, including doing nothing; the risk(s) and benefit(s) of the alternative treatment(s) or procedure(s); and the risk(s) and benefit(s) of doing nothing. The patient was provided information about the general risks and possible complications associated with the procedure. These may include, but are not limited to: failure to achieve desired goals, infection, bleeding, organ or nerve damage, allergic reactions, paralysis, and death. In addition, the patient was informed of those risks and  complications associated to Spine-related procedures, such as failure to decrease pain; infection (i.e.: Meningitis, epidural or intraspinal abscess); bleeding (i.e.: epidural hematoma, subarachnoid hemorrhage, or any  other type of intraspinal or peri-dural bleeding); organ or nerve damage (i.e.: Any type of peripheral nerve, nerve root, or spinal cord injury) with subsequent damage to sensory, motor, and/or autonomic systems, resulting in permanent pain, numbness, and/or weakness of one or several areas of the body; allergic reactions; (i.e.: anaphylactic reaction); and/or death. Furthermore, the patient was informed of those risks and complications associated with the medications. These include, but are not limited to: allergic reactions (i.e.: anaphylactic or anaphylactoid reaction(s)); adrenal axis suppression; blood sugar elevation that in diabetics may result in ketoacidosis or comma; water retention that in patients with history of congestive heart failure may result in shortness of breath, pulmonary edema, and decompensation with resultant heart failure; weight gain; swelling or edema; medication-induced neural toxicity; particulate matter embolism and blood vessel occlusion with resultant organ, and/or nervous system infarction; and/or aseptic necrosis of one or more joints. Finally, the patient was informed that Medicine is not an exact science; therefore, there is also the possibility of unforeseen or unpredictable risks and/or possible complications that may result in a catastrophic outcome. The patient indicated having understood very clearly. We have given the patient no guarantees and we have made no promises. Enough time was given to the patient to ask questions, all of which were answered to the patient's satisfaction. Perry Jones has indicated that he wanted to continue with the procedure. Attestation: I, the ordering provider, attest that I have discussed with the patient the benefits, risks,  side-effects, alternatives, likelihood of achieving goals, and potential problems during recovery for the procedure that I have provided informed consent. Date  Time: {CHL [facility]-PAIN TIME CHOICES:21018001}  Pre-Procedure Preparation:  Monitoring: As per clinic protocol. Respiration, ETCO2, SpO2, BP, heart rate and rhythm monitor placed and checked for adequate function Safety Precautions: Patient was assessed for positional comfort and pressure points before starting the procedure. Time-out: I initiated and conducted the "Time-out" before starting the procedure, as per protocol. The patient was asked to participate by confirming the accuracy of the "Time Out" information. Verification of the correct person, site, and procedure were performed and confirmed by me, the nursing staff, and the patient. "Time-out" conducted as per Joint Commission's Universal Protocol (UP.01.01.01). Time:    Description of Procedure:          Target Area: The interlaminar space, initially targeting the lower laminar border of the superior vertebral body. Approach: Paramedial approach. Area Prepped: Entire Posterior Lumbar Region DuraPrep (Iodine Povacrylex [0.7% available iodine] and Isopropyl Alcohol, 74% w/w) Safety Precautions: Aspiration looking for blood return was conducted prior to all injections. At no point did we inject any substances, as a needle was being advanced. No attempts were made at seeking any paresthesias. Safe injection practices and needle disposal techniques used. Medications properly checked for expiration dates. SDV (single dose vial) medications used. Description of the Procedure: Protocol guidelines were followed. The procedure needle was introduced through the skin, ipsilateral to the reported pain, and advanced to the target area. Bone was contacted and the needle walked caudad, until the lamina was cleared. The epidural space was identified using "loss-of-resistance technique" with 2-3 ml of  PF-NaCl (0.9% NSS), in a 5cc LOR glass syringe.  Vitals:   07/21/20 1233  BP: 103/89  Pulse: 76  Resp: 18  Temp: 97.7 F (36.5 C)  SpO2: 100%  Weight: 225 lb (102.1 kg)  Height: 6' (1.829 m)    Start Time:   hrs. End Time:   hrs.  Materials:  Needle(s) Type: Epidural  needle Gauge: 22G Length: 3.5-in Medication(s): Please see orders for medications and dosing details. 4 cc solution made of 2 cc of preservative-free saline, 2 cc of 0.2% ropivacaine  Imaging Guidance (Spinal):          Type of Imaging Technique: Fluoroscopy Guidance (Spinal) Indication(s): Assistance in needle guidance and placement for procedures requiring needle placement in or near specific anatomical locations not easily accessible without such assistance. Exposure Time: Please see nurses notes. Contrast: Before injecting any contrast, we confirmed that the patient did not have an allergy to iodine, shellfish, or radiological contrast. Once satisfactory needle placement was completed at the desired level, radiological contrast was injected. Contrast injected under live fluoroscopy. No contrast complications. See chart for type and volume of contrast used. Fluoroscopic Guidance: I was personally present during the use of fluoroscopy. "Tunnel Vision Technique" used to obtain the best possible view of the target area. Parallax error corrected before commencing the procedure. "Direction-depth-direction" technique used to introduce the needle under continuous pulsed fluoroscopy. Once target was reached, antero-posterior, oblique, and lateral fluoroscopic projection used confirm needle placement in all planes. Images permanently stored in EMR. Interpretation: I personally interpreted the imaging intraoperatively. Adequate needle placement confirmed in multiple planes. Appropriate spread of contrast into desired area was observed. No evidence of afferent or efferent intravascular uptake. No intrathecal or subarachnoid spread  observed. Permanent images saved into the patient's record.  Antibiotic Prophylaxis:   Anti-infectives (From admission, onward)   None     Indication(s): None identified  Post-operative Assessment:  Post-procedure Vital Signs:  Pulse/HCG Rate: 76  Temp: 97.7 F (36.5 C) Resp: 18 BP: 103/89 SpO2: 100 %  EBL: None  Complications: No immediate post-treatment complications observed by team, or reported by patient.  Note: The patient tolerated the entire procedure well. A repeat set of vitals were taken after the procedure and the patient was kept under observation following institutional policy, for this type of procedure. Post-procedural neurological assessment was performed, showing return to baseline, prior to discharge. The patient was provided with post-procedure discharge instructions, including a section on how to identify potential problems. Should any problems arise concerning this procedure, the patient was given instructions to immediately contact us, at any time, without hesitation. In any case, we plan to contact the patient by telephone for a follow-up status report regarding this interventional procedure.  Comments:  No additional relevant information.  Plan of Care  Orders:  Orders Placed This Encounter  Procedures  . [facility] C-ARM 1-60 MIN NO REPORT    Intraoperative interpretation by procedural physician at [facility].    Standing Status:   Standing    Number of Occurrences:   1    Order Specific Question:   Reason for exam:    Answer:   Assistance in needle guidance and placement for procedures requiring needle placement in or near specific anatomical locations not easily accessible without such assistance.   Chronic Opioid Analgesic:  Hydrocodone 10 mg 3 times daily as needed, quantity 90/month; MME equals 30    Medications ordered for procedure: Meds ordered this encounter  Medications  . iohexol (OMNIPAQUE) 180 MG/ML injection 10 mL     Must be Myelogram-compatible. If not available, you may substitute with a water-soluble, non-ionic, hypoallergenic, myelogram-compatible radiological contrast medium.  Marland Kitchen lidocaine (XYLOCAINE) 2 % (with pres) injection 400 mg  . ropivacaine (PF) 2 mg/mL (0.2%) (NAROPIN) injection 2 mL  . sodium chloride flush (NS) 0.9 % injection 2 mL  Medications administered: Perry Jones "[name]" had no medications administered during this visit.  See the medical record for exact dosing, route, and time of administration.  Follow-up plan:   No follow-ups on file.    Recent Visits Date Type Provider Dept  07/08/20 Office Visit Gillis Santa, MD [facility]  Showing recent visits within past 90 days and meeting all other requirements Today's Visits Date Type Provider Dept  07/21/20 Procedure visit Gillis Santa, MD [facility]  Showing today's visits and meeting all other requirements Future Appointments Date Type Provider Dept  10/05/20 Appointment Gillis Santa, MD [facility]  Showing future appointments within next 90 days and meeting all other requirements  Disposition: Discharge home  Discharge (Date  Time): 07/21/2020;   hrs.   Primary Care Physician: Juluis Pitch, MD Location: [facility] Note by: Gillis Santa, MD Date: 07/21/2020; Time: 12:53 PM  Disclaimer:  Medicine is not an exact science. The only guarantee in medicine is that nothing is guaranteed. It is important to note that the decision to proceed with this intervention was based on the information collected from the patient. The Data and conclusions were drawn from the patient's questionnaire, the interview, and the physical examination. Because the information was provided in large part by the patient, it cannot be guaranteed that it has not been purposely or unconsciously manipulated. Every effort has been made to obtain as much relevant data as possible for this  evaluation. It is important to note that the conclusions that lead to this procedure are derived in large part from the available data. Always take into account that the treatment will also be dependent on availability of resources and existing treatment guidelines, considered by other Pain Management Practitioners as being common knowledge and practice, at the time of the intervention. For Medico-Legal purposes, it is also important to point out that variation in procedural techniques and pharmacological choices are the acceptable norm. The indications, contraindications, technique, and results of the above procedure should only be interpreted and judged by a Board-Certified Interventional Pain Specialist with extensive familiarity and expertise in the same exact procedure and technique.

## 2020-07-21 NOTE — Patient Instructions (Signed)

## 2020-07-21 NOTE — Progress Notes (Signed)
PROVIDER NOTE: Information contained herein reflects review and annotations entered in association with encounter. Interpretation of such information and data should be left to medically-trained personnel. Information provided to patient can be located elsewhere in the medical record under "Patient Instructions". Document created using STT-dictation technology, any transcriptional errors that may result from process are unintentional.    Patient: Perry Jones  Service Category: Procedure  Provider: Gillis Santa, MD  DOB: 1951/06/09  DOS: 07/21/2020  Location: [facility]  MRN: [id number]  Setting: Ambulatory - outpatient  Referring Provider: Gillis Santa, MD  Type: Established Patient  Specialty: Interventional Pain Management  PCP: Juluis Pitch, MD   Primary Reason for Visit: Interventional Pain Management Treatment. CC: Back Pain  Procedure:          Anesthesia, Analgesia, Anxiolysis:  Type: Diagnostic Epidural  Injection #1 without steroid Region: Caudal Level: Sacrococcygeal   Laterality: Midline        Local Anesthetic: Lidocaine 1-2%  Position: Prone   Indications: 1. Chronic radicular lumbar pain   2. Thoracic spine pain   3. Chronic pain syndrome   4. History of lumbar fusion (L3-S1)   5. Post laminectomy syndrome    Pain Score: Pre-procedure: 5 /10 Post-procedure: 5 /10   Pre-op H&P Assessment:  Perry Jones is a 70 y.o. (year old), male patient, seen today for interventional treatment. He  has a past surgical history that includes right knee replaced (2007); Knee arthroscopy; L1-L4 fuzed; Joint replacement (Right); Back surgery; Colonoscopy with propofol (N/A, 11/22/2017); Cholecystectomy; and basal cell removal. Perry Jones has a current medication list which includes the following prescription(s): benazepril-hydrochlorthiazide, biotin, cyclobenzaprine, gabapentin, hydrocodone-acetaminophen, [START ON 08/14/2020] hydrocodone-acetaminophen, [START ON  09/13/2020] hydrocodone-acetaminophen, loratadine, lorazepam, multi-vitamins, zolpidem, and astragalus. His primarily concern today is the Back Pain  Initial Vital Signs:  Pulse/HCG Rate: 76ECG Heart Rate: 74 Temp: 97.7 F (36.5 C) Resp: 18 BP: 103/89 SpO2: 100 %  BMI: Estimated body mass index is 30.52 kg/m as calculated from the following:   Height as of this encounter: 6' (1.829 m).   Weight as of this encounter: 225 lb (102.1 kg).  Risk Assessment: Allergies: Reviewed. He is allergic to prednisone.  Allergy Precautions: None required Coagulopathies: Reviewed. None identified.  Blood-thinner therapy: None at this time Active Infection(s): Reviewed. None identified. Perry Jones is afebrile  Site Confirmation: Perry Jones was asked to confirm the procedure and laterality before marking the site Procedure checklist: Completed Consent: Before the procedure and under the influence of no sedative(s), amnesic(s), or anxiolytics, the patient was informed of the treatment options, risks and possible complications. To fulfill our ethical and legal obligations, as recommended by the American Medical Association's Code of Ethics, I have informed the patient of my clinical impression; the nature and purpose of the treatment or procedure; the risks, benefits, and possible complications of the intervention; the alternatives, including doing nothing; the risk(s) and benefit(s) of the alternative treatment(s) or procedure(s); and the risk(s) and benefit(s) of doing nothing. The patient was provided information about the general risks and possible complications associated with the procedure. These may include, but are not limited to: failure to achieve desired goals, infection, bleeding, organ or nerve damage, allergic reactions, paralysis, and death. In addition, the patient was informed of those risks and complications associated to Spine-related procedures, such as failure to decrease pain; infection (i.e.:  Meningitis, epidural or intraspinal abscess); bleeding (i.e.: epidural hematoma, subarachnoid hemorrhage, or any other type of intraspinal or peri-dural bleeding); organ or  nerve damage (i.e.: Any type of peripheral nerve, nerve root, or spinal cord injury) with subsequent damage to sensory, motor, and/or autonomic systems, resulting in permanent pain, numbness, and/or weakness of one or several areas of the body; allergic reactions; (i.e.: anaphylactic reaction); and/or death. Furthermore, the patient was informed of those risks and complications associated with the medications. These include, but are not limited to: allergic reactions (i.e.: anaphylactic or anaphylactoid reaction(s)); adrenal axis suppression; blood sugar elevation that in diabetics may result in ketoacidosis or comma; water retention that in patients with history of congestive heart failure may result in shortness of breath, pulmonary edema, and decompensation with resultant heart failure; weight gain; swelling or edema; medication-induced neural toxicity; particulate matter embolism and blood vessel occlusion with resultant organ, and/or nervous system infarction; and/or aseptic necrosis of one or more joints. Finally, the patient was informed that Medicine is not an exact science; therefore, there is also the possibility of unforeseen or unpredictable risks and/or possible complications that may result in a catastrophic outcome. The patient indicated having understood very clearly. We have given the patient no guarantees and we have made no promises. Enough time was given to the patient to ask questions, all of which were answered to the patient's satisfaction. Perry Jones has indicated that he wanted to continue with the procedure. Attestation: I, the ordering provider, attest that I have discussed with the patient the benefits, risks, side-effects, alternatives, likelihood of achieving goals, and potential problems during recovery for the  procedure that I have provided informed consent. Date  Time: 07/21/2020 12:30 PM  Pre-Procedure Preparation:  Monitoring: As per clinic protocol. Respiration, ETCO2, SpO2, BP, heart rate and rhythm monitor placed and checked for adequate function Safety Precautions: Patient was assessed for positional comfort and pressure points before starting the procedure. Time-out: I initiated and conducted the "Time-out" before starting the procedure, as per protocol. The patient was asked to participate by confirming the accuracy of the "Time Out" information. Verification of the correct person, site, and procedure were performed and confirmed by me, the nursing staff, and the patient. "Time-out" conducted as per Joint Commission's Universal Protocol (UP.01.01.01). Time: 1306  Description of Procedure:          Target Area: Caudal Epidural Canal. Approach: Midline approach. Area Prepped: Entire Posterior Sacrococcygeal Region DuraPrep (Iodine Povacrylex [0.7% available iodine] and Isopropyl Alcohol, 74% w/w) Safety Precautions: Aspiration looking for blood return was conducted prior to all injections. At no point did we inject any substances, as a needle was being advanced. No attempts were made at seeking any paresthesias. Safe injection practices and needle disposal techniques used. Medications properly checked for expiration dates. SDV (single dose vial) medications used. Description of the Procedure: Protocol guidelines were followed. The patient was placed in position over the fluoroscopy table. The target area was identified and the area prepped in the usual manner. Skin & deeper tissues infiltrated with local anesthetic. Appropriate amount of time allowed to pass for local anesthetics to take effect. The procedure needles were then advanced to the target area. Proper needle placement secured. Negative aspiration confirmed. Solution injected in intermittent fashion, asking for systemic symptoms every 0.5cc  of injectate. The needles were then removed and the area cleansed, making sure to leave some of the prepping solution back to take advantage of its long term bactericidal properties. Vitals:   07/21/20 1233 07/21/20 1308 07/21/20 1311 07/21/20 1316  BP: 103/89 (!) 156/111 (!) 155/113 135/83  Pulse: 76     Resp:  18 12 15    Temp: 97.7 F (36.5 C)     SpO2: 100% 96% 96%   Weight: 225 lb (102.1 kg)     Height: 6' (1.829 m)       Start Time: 1306 hrs. End Time: 1311 hrs. Materials:  Needle(s) Type: Epidural needle Gauge: 22G Length: 3.5-in Medication(s): Please see orders for medications and dosing details. 4 cc solution made of 2 cc of 0.2% ropivacaine, 2 cc of normal saline. Imaging Guidance (Spinal):          Type of Imaging Technique: Fluoroscopy Guidance (Spinal) Indication(s): Assistance in needle guidance and placement for procedures requiring needle placement in or near specific anatomical locations not easily accessible without such assistance. Exposure Time: Please see nurses notes. Contrast: Before injecting any contrast, we confirmed that the patient did not have an allergy to iodine, shellfish, or radiological contrast. Once satisfactory needle placement was completed at the desired level, radiological contrast was injected. Contrast injected under live fluoroscopy. No contrast complications. See chart for type and volume of contrast used. Fluoroscopic Guidance: I was personally present during the use of fluoroscopy. "Tunnel Vision Technique" used to obtain the best possible view of the target area. Parallax error corrected before commencing the procedure. "Direction-depth-direction" technique used to introduce the needle under continuous pulsed fluoroscopy. Once target was reached, antero-posterior, oblique, and lateral fluoroscopic projection used confirm needle placement in all planes. Images permanently stored in EMR. Interpretation: I personally interpreted the imaging  intraoperatively. Adequate needle placement confirmed in multiple planes. Appropriate spread of contrast into desired area was observed. No evidence of afferent or efferent intravascular uptake. No intrathecal or subarachnoid spread observed. Permanent images saved into the patient's record.   Post-operative Assessment:  Post-procedure Vital Signs:  Pulse/HCG Rate: 7677 Temp: 97.7 F (36.5 C) Resp: 15 BP: 135/83 SpO2: 96 %  EBL: None  Complications: No immediate post-treatment complications observed by team, or reported by patient.  Note: The patient tolerated the entire procedure well. A repeat set of vitals were taken after the procedure and the patient was kept under observation following institutional policy, for this type of procedure. Post-procedural neurological assessment was performed, showing return to baseline, prior to discharge. The patient was provided with post-procedure discharge instructions, including a section on how to identify potential problems. Should any problems arise concerning this procedure, the patient was given instructions to immediately contact us, at any time, without hesitation. In any case, we plan to contact the patient by telephone for a follow-up status report regarding this interventional procedure.  Comments:  No additional relevant information.  Plan of Care   Patient has a history of L3-S1 spinal fusion with associated failed back surgical syndrome, postlaminectomy pain syndrome as well as chronic lumbar radicular pain as well as axial low back pain.  We have discussed a potential spinal cord stimulator trial for his chronic pain related to failed back surgical syndrome in the context of his L3-S1 spinal fusion.  Of note, I obtained a lumbar MRI in early December which shows patent spinal canal at L1-L2.  I would also like to obtain a thoracic MRI for spinal cord stimulator trial programming to rule out thoracic canal stenosis.  I will also send the  patient for psychological evaluation with Dr. Lyman Speller for spinal cord stimulator trial/implant.  Patient instructed to follow-up in 4 weeks after thoracic MRI and psych eval to further discuss and schedule spinal cord stimulator trial.  Orders:  Orders Placed This Encounter  Procedures  .  DG PAIN CLINIC C-ARM 1-60 MIN NO REPORT    Intraoperative interpretation by procedural physician at [facility].    Standing Status:   Standing    Number of Occurrences:   1    Order Specific Question:   Reason for exam:    Answer:   Assistance in needle guidance and placement for procedures requiring needle placement in or near specific anatomical locations not easily accessible without such assistance.  . MR THORACIC SPINE WO CONTRAST    Patient presents with axial pain with possible radicular component.  In addition to any acute findings, please report on:  1. Facet (Zygapophyseal) joint DJD (Hypertrophy, space narrowing, subchondral sclerosis, and/or osteophyte formation) 2. DDD and/or IVDD (Loss of disc height, desiccation or "Black disc disease") 3. Pars defects 4. Spondylolisthesis, spondylosis, and/or spondyloarthropathies (include Degree/Grade of displacement in mm) 5. Vertebral body Fractures, including age (old, new/acute) 6. Modic Type Changes 7. Demineralization 8. Bone pathology 9. Central, Lateral Recess, and/or Foraminal Stenosis (include AP diameter of stenosis in mm) 10. Surgical changes (hardware type, status, and presence of fibrosis) NOTE: Please specify level(s) and laterality.    Standing Status:   Future    Standing Expiration Date:   10/19/2020    Order Specific Question:   What is the patient's sedation requirement?    Answer:   No Sedation    Order Specific Question:   Does the patient have a pacemaker or implanted devices?    Answer:   No    Order Specific Question:   Preferred imaging location?    Answer:   [facility] (table limit - 550lbs)    Order  Specific Question:   Call Results- Best Contact Number?    Answer:   [phone] [facility])    Order Specific Question:   Radiology Contrast Protocol - do NOT remove file path    Answer:   [email or web address]  . Ambulatory referral to Psychology    Referral Priority:   Routine    Referral Type:   Psychiatric    Referral Reason:   Specialty Services Required    Referred to Provider:   Llana AlimentFeldman, Jeffrey B, PhD    Requested Specialty:   Psychology    Number of Visits Requested:   1   Chronic Opioid Analgesic:  Hydrocodone 10 mg 3 times daily as needed, quantity 90/month; MME equals 30    Medications ordered for procedure: Meds ordered this encounter  Medications  . iohexol (OMNIPAQUE) 180 MG/ML injection 10 mL    Must be Myelogram-compatible. If not available, you may substitute with a water-soluble, non-ionic, hypoallergenic, myelogram-compatible radiological contrast medium.  Marland Kitchen. lidocaine (XYLOCAINE) 2 % (with pres) injection 400 mg  . ropivacaine (PF) 2 mg/mL (0.2%) (NAROPIN) injection 2 mL  . sodium chloride flush (NS) 0.9 % injection 2 mL   Medications administered: We administered iohexol, lidocaine, ropivacaine (PF) 2 mg/mL (0.2%), and sodium chloride flush.  See the medical record for exact dosing, route, and time of administration.  Follow-up plan:   Return in about 4 weeks (around 08/18/2020) for PPE + SCS discussion F2F.    Recent Visits Date Type Provider Dept  07/08/20 Office Visit Edward JollyLateef, Mako Pelfrey, MD [facility]  Showing recent visits within past 90 days and meeting all other requirements Today's Visits Date Type Provider Dept  07/21/20 Procedure visit Edward JollyLateef, Liliyana Thobe, MD [facility]  Showing today's visits and meeting all other requirements Future Appointments Date Type Provider Dept  10/05/20 Appointment Cherylann RatelLateef,  Carlus Pavlov, MD [facility]  Showing future appointments within next 90 days and meeting all other  requirements  Disposition: Discharge home  Discharge (Date  Time): 07/21/2020; 1320 hrs.   Primary Care Physician: Juluis Pitch, MD Location: [facility] Note by: Gillis Santa, MD Date: 07/21/2020; Time: 1:43 PM  Disclaimer:  Medicine is not an exact science. The only guarantee in medicine is that nothing is guaranteed. It is important to note that the decision to proceed with this intervention was based on the information collected from the patient. The Data and conclusions were drawn from the patient's questionnaire, the interview, and the physical examination. Because the information was provided in large part by the patient, it cannot be guaranteed that it has not been purposely or unconsciously manipulated. Every effort has been made to obtain as much relevant data as possible for this evaluation. It is important to note that the conclusions that lead to this procedure are derived in large part from the available data. Always take into account that the treatment will also be dependent on availability of resources and existing treatment guidelines, considered by other Pain Management Practitioners as being common knowledge and practice, at the time of the intervention. For Medico-Legal purposes, it is also important to point out that variation in procedural techniques and pharmacological choices are the acceptable norm. The indications, contraindications, technique, and results of the above procedure should only be interpreted and judged by a Board-Certified Interventional Pain Specialist with extensive familiarity and expertise in the same exact procedure and technique.

## 2020-07-22 ENCOUNTER — Telehealth: Payer: Self-pay

## 2020-07-22 NOTE — Telephone Encounter (Signed)
Denies any needs at this. Instructed to call if needed.Instructed to call if needed.

## 2020-07-23 ENCOUNTER — Other Ambulatory Visit: Payer: Self-pay

## 2020-07-23 ENCOUNTER — Ambulatory Visit: Payer: Medicare HMO | Admitting: Pulmonary Disease

## 2020-07-23 ENCOUNTER — Encounter: Payer: Self-pay | Admitting: Pulmonary Disease

## 2020-07-23 DIAGNOSIS — F5101 Primary insomnia: Secondary | ICD-10-CM

## 2020-07-23 MED ORDER — ZOLPIDEM TARTRATE 10 MG PO TABS: 10.0000 mg | ORAL_TABLET | Freq: Every evening | ORAL | 0 refills | Status: DC | PRN

## 2020-07-23 NOTE — Progress Notes (Signed)
@Patient  ID: Perry Jones, male    DOB: December 18, 1950, 70 y.o.   MRN: [id number]  Chief Complaint  Patient presents with  . Follow-up    Sleeping well with Ambien with occ wake during the night due to pain.     Referring provider: Juluis Pitch, MD  HPI:  70 year old male former smoker followed in our office for insomnia and OSA  PMH: HTN, Chronic radicular lumbar pain, Hx total Rt. Knee replacement, Lumbar fusion, HLD, DDD.   Smoker/ Smoking History: (Quit Date: 01/24/1985), 2 ppd, 40 pack-years  Pt of: Dr. Mortimer Fries  07/23/2020  -   70 y.o. male presents today for mild OSA follow up and insomnia. Last seen by Dr. Mortimer Fries on 08/04/19 via video visit. Home sleep study 08/09/2015 revealed mild OSA with an AHI of 11. Patient refused CPAP initiation at that time, today report shown and continues to not accept report, will address this today. Currently using 10mg  Ambien QHS, reports sleeping 6-8 hours if not experiencing back pain.   Patient struggles with chronic pain.  He has ongoing interventions.  Most recently last week tried to have a nerve stimulator placed which was unsuccessful.  Patient attributes most of his sleeping difficulties to his chronic pain.  He also reports that he is trouble initiating sleep.  Uses Ambien nightly.  Also utilizes Ativan 4-5 times a month.  [place] PMP aware reviewed.   Imaging: DG PAIN CLINIC C-ARM 1-60 MIN NO REPORT  Result Date: 07/21/2020 Fluoro was used, but no Radiologist interpretation will be provided. Please refer to "NOTES" tab for provider progress note.   Lab Results:  CBC    Component Value Date/Time   WBC 11.8 (H) 07/14/2018 2138   RBC 5.62 07/14/2018 2138   HGB 16.7 07/14/2018 2138   HCT 48.2 07/14/2018 2138   PLT 258 07/14/2018 2138   MCV 85.8 07/14/2018 2138   MCH 29.7 07/14/2018 2138   MCHC 34.6 07/14/2018 2138   RDW 12.8 07/14/2018 2138    BMET    Component Value Date/Time   NA 136 07/14/2018 2138   K 3.3 (L) 07/14/2018  2138   CL 99 07/14/2018 2138   CO2 26 07/14/2018 2138   GLUCOSE 173 (H) 07/14/2018 2138   BUN 14 05/18/2020 0857   CREATININE 0.98 05/18/2020 0857   CALCIUM 9.8 07/14/2018 2138   GFRNONAA >60 05/18/2020 0857   GFRAA >60 07/14/2018 2138    BNP No results found for: BNP  ProBNP No results found for: PROBNP  Specialty Problems   None     Allergies  Allergen Reactions  . Prednisone Other (See Comments)    BLURRED VISION    Immunization History  Administered Date(s) Administered  . Influenza Split 05/17/2015  . Influenza, High Dose Seasonal PF 02/27/2018  . Influenza-Unspecified 04/02/2015, 03/26/2016, 03/28/2016, 04/19/2018, 04/03/2019, 02/26/2020  . PFIZER(Purple Top)SARS-COV-2 Vaccination 08/05/2019, 08/26/2019  . Pneumococcal Conjugate-13 08/03/2017  . Pneumococcal Polysaccharide-23 08/07/2018  . Zoster Recombinat (Shingrix) 09/20/2017, 02/27/2018    Past Medical History:  Diagnosis Date  . High cholesterol   . Hypertension   . Sinus trouble     Tobacco History: Social History   Tobacco Use  Smoking Status Former Smoker  . Packs/day: 2.00  . Years: 20.00  . Pack years: 40.00  . Types: Cigarettes  . Quit date: 01/24/1985  . Years since quitting: 35.5  Smokeless Tobacco Never Used   Counseling given: Not Answered   Continue to not smoke  Outpatient Encounter  Medications as of 07/23/2020  Medication Sig  . benazepril-hydrochlorthiazide (LOTENSIN HCT) 20-12.5 MG tablet Take by mouth.  . Biotin 1000 MCG tablet Take by mouth.  . cyclobenzaprine (FLEXERIL) 10 MG tablet Take 10 mg by mouth daily as needed.  . gabapentin (NEURONTIN) 400 MG capsule Take 400 mg by mouth 5 (five) times daily.  Marland Kitchen HYDROcodone-acetaminophen (NORCO) 10-325 MG tablet Take 1 tablet by mouth every 6 (six) hours as needed for severe pain. Must last 30 days.  Derrill Memo ON 08/14/2020] HYDROcodone-acetaminophen (NORCO) 10-325 MG tablet Take 1 tablet by mouth every 6 (six) hours as needed for  severe pain. Must last 30 days.  Derrill Memo ON 09/13/2020] HYDROcodone-acetaminophen (NORCO) 10-325 MG tablet Take 1 tablet by mouth every 6 (six) hours as needed for severe pain. Must last 30 days.  . Loratadine 10 MG CAPS Take 10 mg by mouth daily.  Marland Kitchen LORazepam (ATIVAN) 0.5 MG tablet Take by mouth.  . Multiple Vitamin (MULTI-VITAMINS) TABS Take by mouth.  . [DISCONTINUED] zolpidem (AMBIEN) 10 MG tablet TAKE 1 TABLET BY MOUTH EVERY NIGHT AT BEDTIME AS NEEDED FOR SLEEP  . zolpidem (AMBIEN) 10 MG tablet Take 1 tablet (10 mg total) by mouth at bedtime as needed. for sleep  . [DISCONTINUED] ASTRAGALUS PO Take by mouth. (Patient not taking: No sig reported)   No facility-administered encounter medications on file as of 07/23/2020.     Review of Systems  Review of Systems  Constitutional: Negative.   HENT: Negative.   Eyes: Negative.   Respiratory: Negative.   Cardiovascular: Negative.   Musculoskeletal: Positive for back pain and joint swelling (Hx Rt knee replacement).  Skin: Negative.   Neurological: Negative.   Hematological: Negative.   Psychiatric/Behavioral: Negative.      Physical Exam  BP 128/88 (BP Location: Left Arm, Cuff Size: Normal)   Pulse 64   Temp (!) 97.1 F (36.2 C) (Temporal)   Ht 6' (1.829 m)   Wt 235 lb 12.8 oz (107 kg)   SpO2 98%   BMI 31.98 kg/m   Wt Readings from Last 5 Encounters:  07/23/20 235 lb 12.8 oz (107 kg)  07/21/20 225 lb (102.1 kg)  07/08/20 225 lb (102.1 kg)  04/15/20 230 lb (104.3 kg)  04/01/20 231 lb (104.8 kg)    BMI Readings from Last 5 Encounters:  07/23/20 31.98 kg/m  07/21/20 30.52 kg/m  07/08/20 30.52 kg/m  04/15/20 31.19 kg/m  04/01/20 32.22 kg/m     Physical Exam Vitals reviewed.  Constitutional:      Appearance: Normal appearance.  HENT:     Head: Normocephalic.     Nose: Nose normal.     Mouth/Throat:     Mouth: Mucous membranes are moist.     Comments: MP1 Cardiovascular:     Rate and Rhythm: Normal rate  and regular rhythm.     Pulses: Normal pulses.     Heart sounds: Normal heart sounds.  Pulmonary:     Effort: Pulmonary effort is normal.     Breath sounds: Normal breath sounds.  Musculoskeletal:     Comments: Rt knee with brace, hx of replacement  Skin:    General: Skin is warm and dry.  Neurological:     Mental Status: He is alert.       Assessment & Plan:   Discussion: Patient with a diagnosis of primary insomnia.  Previously managed by DR.  Managed on Ambien chronically.  Will refill today.  I believe patient would benefit from evaluation  of sleep MD given history of mild OSA without treatment and ongoing insomnia.  Patient also on multiple pain management medications including benzodiazepines.  Patient is agreeable to meet with sleep MD.  We will coordinate this in a 2 to 68-month follow-up.  We will refill Ambien today.  Primary insomnia Maintained on Ambien. History of multiple chronic pain issues, maintained on narcotics, muscle relaxants, and benzodiazepines. Sleep study from 08/09/2015 revealed mild OSA with AHI of 11. Patient refused CPAP at that time and today continues to not believe report. He is agreeable to see study doctor for further review.   Plan:  Maintain on Ambien.  Schedule appt.with Dr. Halford Chessman and possible repeat sleep study.     Return in about 3 months (around 10/21/2020), or if symptoms worsen or fail to improve, for Follow up with Dr. Halford Chessman, [facility].   Lauraine Rinne, NP 07/23/2020   This appointment required 28 minutes of patient care (this includes precharting, chart review, review of results, face-to-face care, etc.).

## 2020-07-23 NOTE — Patient Instructions (Addendum)
You were seen today by Lauraine Rinne, NP  for:   1. Primary insomnia  - zolpidem (AMBIEN) 10 MG tablet; Take 1 tablet (10 mg total) by mouth at bedtime as needed. for sleep  Dispense: 90 tablet; Refill: 0  Okay to continue to use Ambien at night  We will coordinate sleep MD consult in our office to further assess whether or not we need to repeat sleep studies or if any other interventions could be utilized to help improve insomnia and quality sleep  Work on improving sleep hygiene:   Sleep habits   Keep a sleep diary to help you and your health care provider figure out what could be causing your insomnia. Write down: ? When you sleep. ? When you wake up during the night. ? How well you sleep. ? How rested you feel the next day. ? Any side effects of medicines you are taking. ? What you eat and drink.  Make your bedroom a dark, comfortable place where it is easy to fall asleep. ? Put up shades or blackout curtains to block light from outside. ? Use a white noise machine to block noise. ? Keep the temperature cool.  Limit screen use before bedtime. This includes: ? Watching TV. ? Using your smartphone, tablet, or computer.  Stick to a routine that includes going to bed and waking up at the same times every day and night. This can help you fall asleep faster. Consider making a quiet activity, such as reading, part of your nighttime routine.  Try to avoid taking naps during the day so that you sleep better at night.  Get out of bed if you are still awake after 15 minutes of trying to sleep. Keep the lights down, but try reading or doing a quiet activity. When you feel sleepy, go back to bed.   We recommend today:   Meds ordered this encounter  Medications  . zolpidem (AMBIEN) 10 MG tablet    Sig: Take 1 tablet (10 mg total) by mouth at bedtime as needed. for sleep    Dispense:  90 tablet    Refill:  0    Follow Up:    Return in about 3 months (around 10/21/2020), or if  symptoms worsen or fail to improve, for Follow up with Dr. Halford Chessman, [facility].   Notification of test results are managed in the following manner: If there are  any recommendations or changes to the  plan of care discussed in office today,  we will contact you and let you know what they are. If you do not hear from [place], then your results are normal and you can view them through your  MyChart account , or a letter will be sent to you. Thank you again for trusting [place] with your care  - Thank you, [facility]    It is flu season:   >>> Best ways to protect herself from the flu: Receive the yearly flu vaccine, practice good hand hygiene washing with soap and also using hand sanitizer when available, eat a nutritious meals, get adequate rest, hydrate appropriately       Please contact the office if your symptoms worsen or you have concerns that you are not improving.   Thank you for choosing  [facility] for your healthcare, and for allowing [place] to partner with you on your healthcare journey. I am thankful to be able to provide care to you today.   Wyn Quaker FNP-C

## 2020-07-23 NOTE — Assessment & Plan Note (Signed)
Maintained on Ambien. History of multiple chronic pain issues, maintained on narcotics, muscle relaxants, and benzodiazepines. Sleep study from 08/09/2015 revealed mild OSA with AHI of 11. Patient refused CPAP at that time and today continues to not believe report. He is agreeable to see study doctor for further review.   Plan:  Maintain on Ambien.  Schedule appt.with Dr. Halford Chessman and possible repeat sleep study.

## 2020-07-23 NOTE — Progress Notes (Signed)
Reviewed and agree with assessment/plan.   Chesley Mires, MD [facility] Pulmonary/Critical Care 07/23/2020, 10:39 AM Pager:  [phone]

## 2020-07-26 ENCOUNTER — Encounter: Payer: Self-pay | Admitting: Student in an Organized Health Care Education/Training Program

## 2020-07-29 ENCOUNTER — Encounter: Payer: Self-pay | Admitting: Student in an Organized Health Care Education/Training Program

## 2020-07-30 ENCOUNTER — Encounter: Payer: Self-pay | Admitting: Student in an Organized Health Care Education/Training Program

## 2020-08-19 ENCOUNTER — Ambulatory Visit
Admission: RE | Admit: 2020-08-19 | Discharge: 2020-08-19 | Disposition: A | Discharge status: Home / Self Care | Payer: Medicare HMO | Source: Ambulatory Visit | Attending: Student in an Organized Health Care Education/Training Program | Admitting: Student in an Organized Health Care Education/Training Program

## 2020-08-19 ENCOUNTER — Other Ambulatory Visit: Payer: Self-pay

## 2020-08-19 DIAGNOSIS — M546 Pain in thoracic spine: Secondary | ICD-10-CM | POA: Insufficient documentation

## 2020-08-19 DIAGNOSIS — M961 Postlaminectomy syndrome, not elsewhere classified: Secondary | ICD-10-CM | POA: Diagnosis present

## 2020-08-19 DIAGNOSIS — G894 Chronic pain syndrome: Secondary | ICD-10-CM | POA: Insufficient documentation

## 2020-08-19 IMAGING — MR MR THORACIC SPINE W/O CM
6 series · 32 of 48 positions shown · non-contrast
Comparison: None.

CLINICAL DATA: Mid back pain

EXAM:
MRI THORACIC SPINE WITHOUT CONTRAST
TECHNIQUE: Multiplanar, multisequence MR imaging of the thoracic spine was
performed. No intravenous contrast was administered.

[Series 16: T1 · sagittal · 5.0mm · 1.41mm/px · 4 of 9 slices shown (1 of 2)]
[im 1/9]
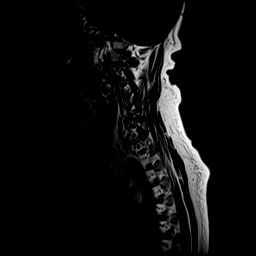
[im 3/9]
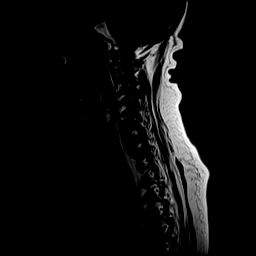
[im 6/9]
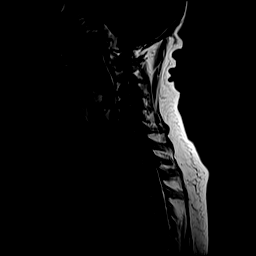
[im 9/9]
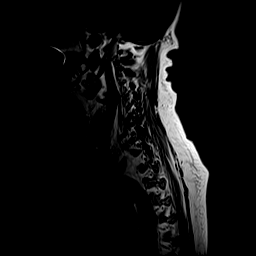

[Series 17: T2 · sagittal · 3.0mm · 1.06mm/px · 6 of 19 slices shown (1 of 2)]
[im 1/19]
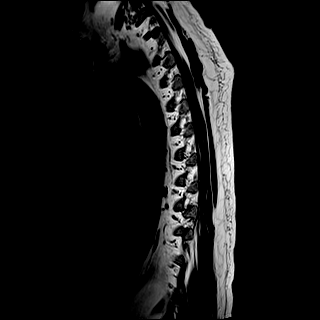
[im 4/19]
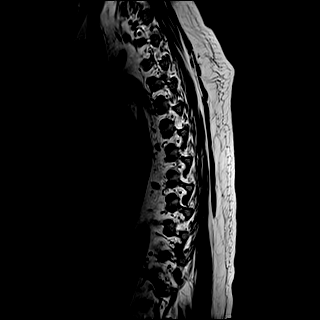
[im 8/19]
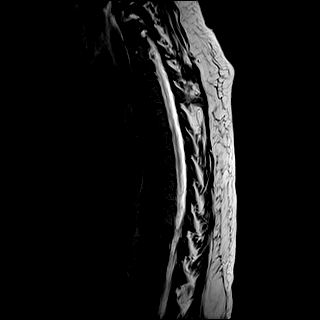
[im 11/19]
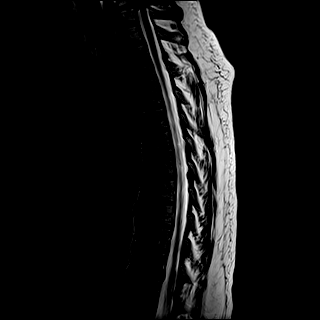
[im 15/19]
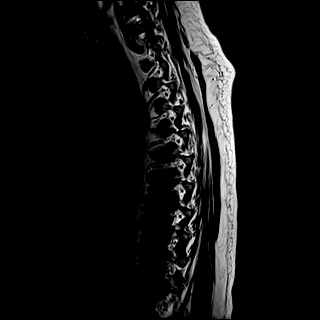
[im 19/19]
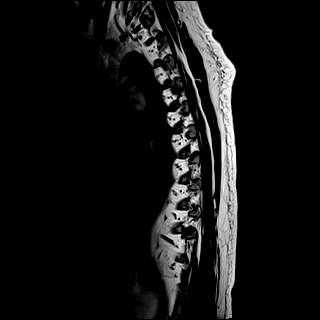

[Series 18: T1 · sagittal · 3.0mm · 1.06mm/px · 6 of 19 slices shown (2 of 2)]
[im 1/19]
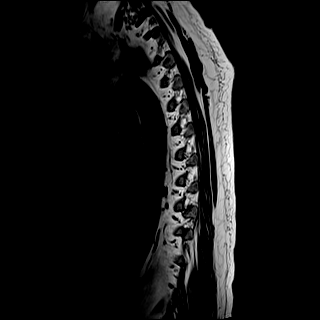
[im 4/19]
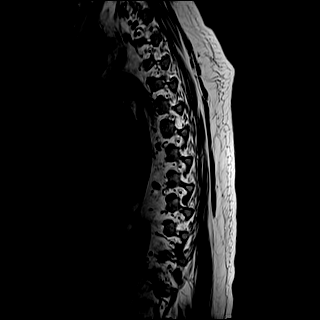
[im 8/19]
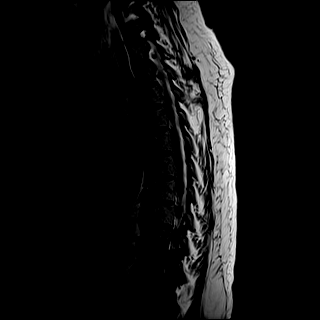
[im 11/19]
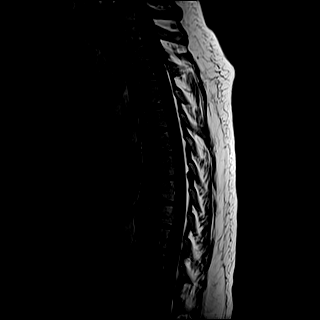
[im 15/19]
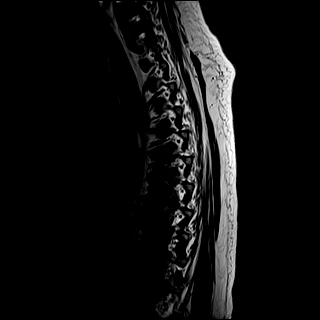
[im 19/19]
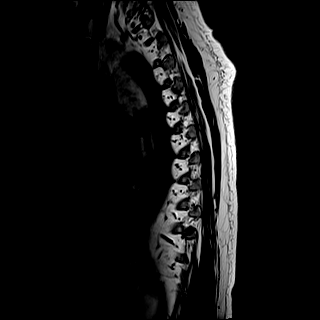

[Series 19: STIR · sagittal · 3.0mm · 0.53mm/px · 6 of 19 slices shown]
[im 1/19]
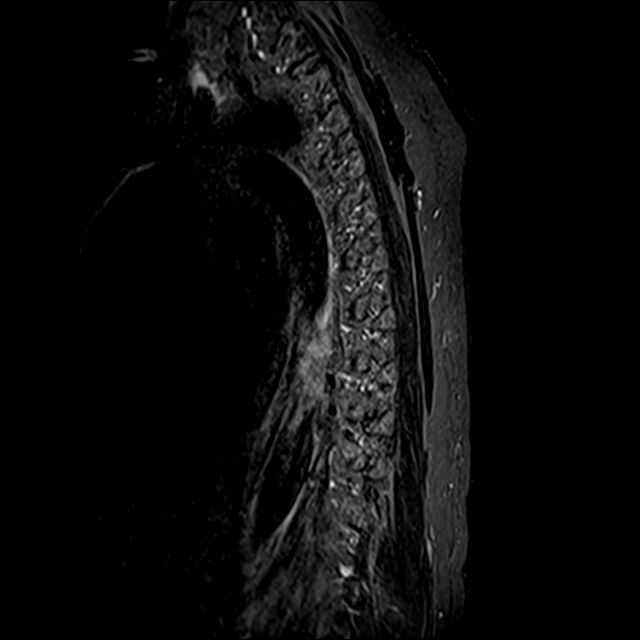
[im 4/19]
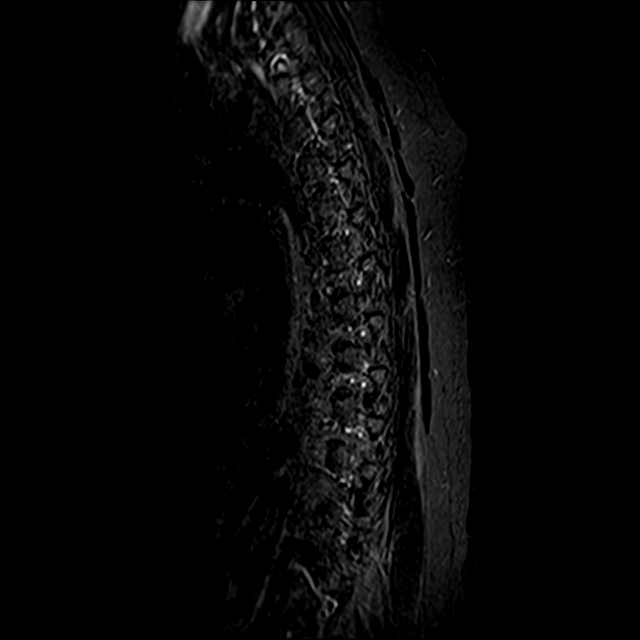
[im 8/19]
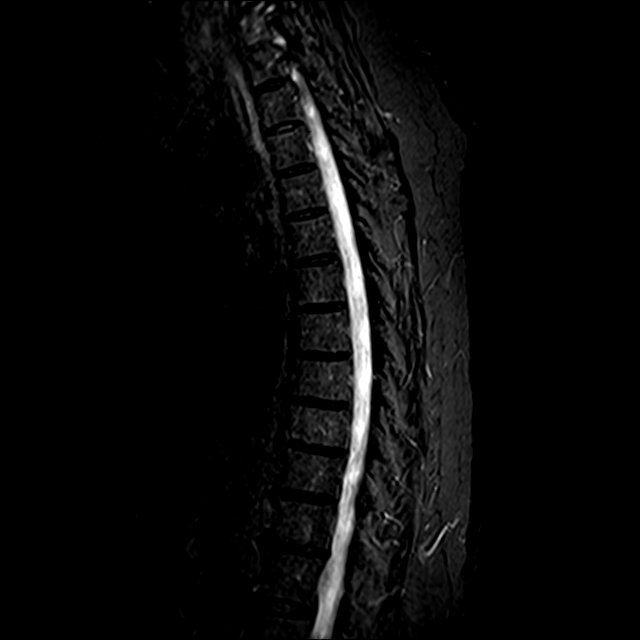
[im 11/19]
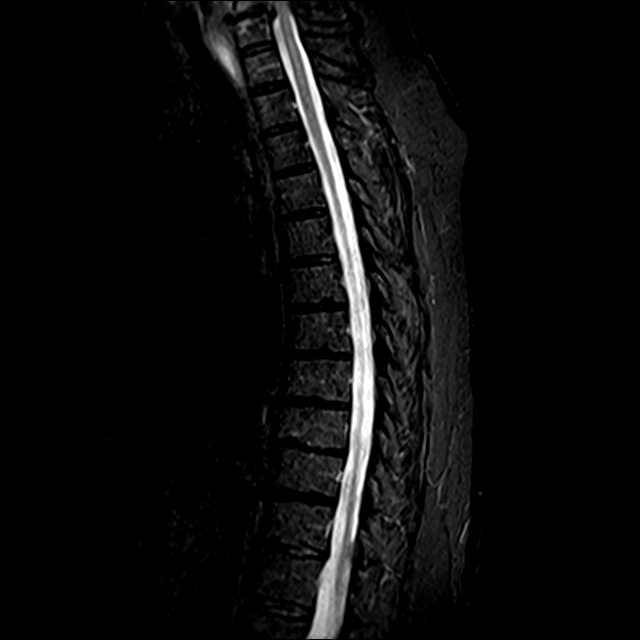
[im 15/19]
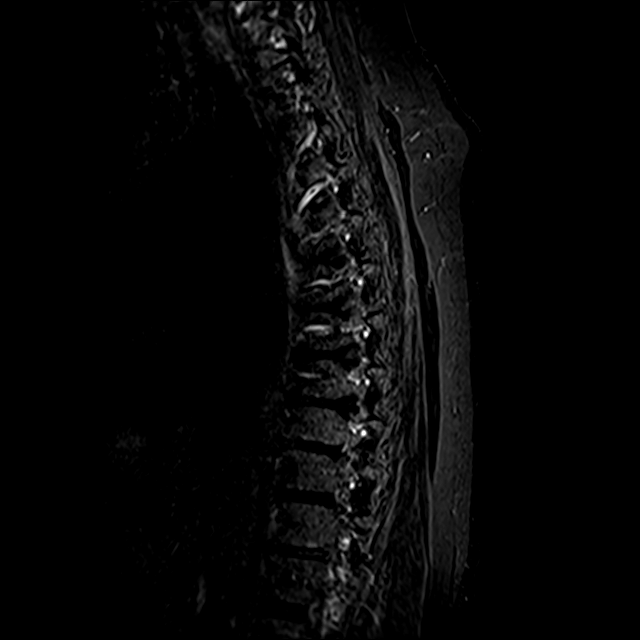
[im 19/19]
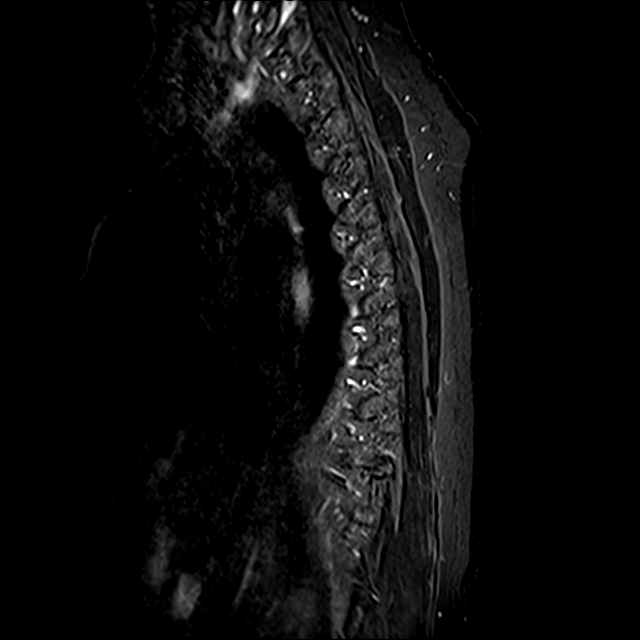

[Series 20: T2 · axial · 4.0mm · 0.59mm/px · z∈[-376,-96]mm · 9 of 39 slices shown (2 of 2)]
[im 1/39]
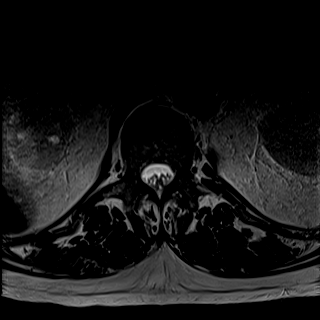
[im 7/39]
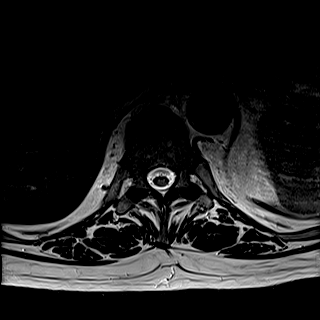
[im 13/39]
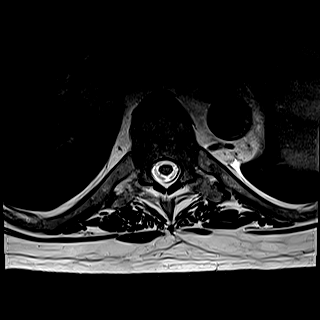
[im 16/39]
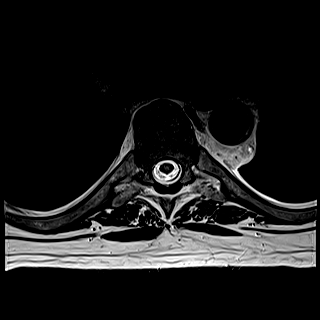
[im 20/39]
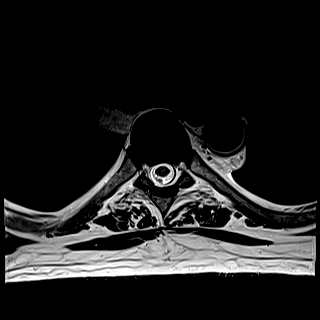
[im 23/39]
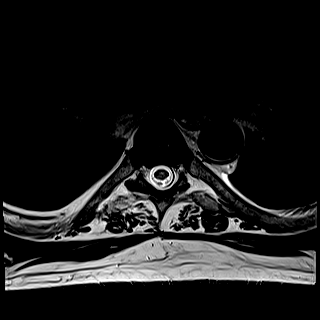
[im 26/39]
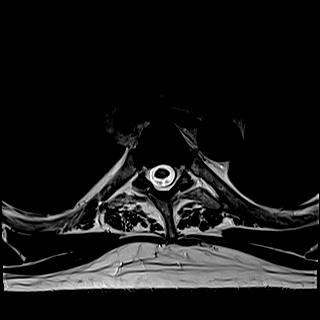
[im 32/39]
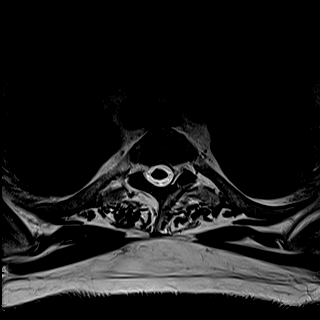
[im 39/39]
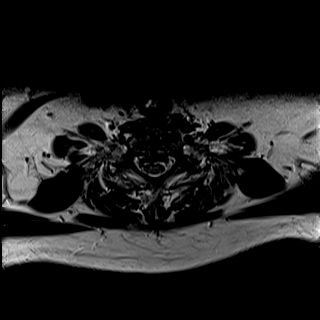

[Series 21: GRE · axial · 4.0mm · 0.37mm/px · 1 of 39 slices shown]
[im 1/39]
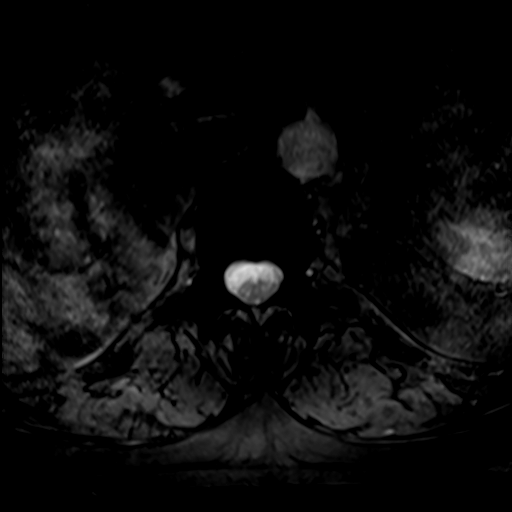

[32 of 48 positions shown; findings below may reference images not displayed]

FINDINGS: Alignment:  Physiologic.

Vertebrae: No fracture, evidence of discitis, or bone lesion.

Cord:  Normal signal and morphology.

Paraspinal and other soft tissues: Negative.

Disc levels:

No significant disc herniation, spinal canal or neural foraminal
stenosis at any thoracic level. Small posterior disc protrusion at
T11-12 only causes minimal indentation on the thecal sac.
IMPRESSION: No significant disc herniation, spinal canal or neural foraminal
stenosis at any thoracic level.

## 2020-08-23 ENCOUNTER — Encounter: Payer: Self-pay | Admitting: Student in an Organized Health Care Education/Training Program

## 2020-09-08 ENCOUNTER — Ambulatory Visit: Payer: Medicare HMO | Admitting: Pulmonary Disease

## 2020-09-10 ENCOUNTER — Encounter: Payer: Self-pay | Admitting: Student in an Organized Health Care Education/Training Program

## 2020-09-27 ENCOUNTER — Encounter: Payer: Self-pay | Admitting: Student in an Organized Health Care Education/Training Program

## 2020-09-28 ENCOUNTER — Emergency Department
Admission: EM | Admit: 2020-09-28 | Discharge: 2020-09-29 | Disposition: A | Discharge status: Other Acute Inpatient Hospital | Payer: Medicare HMO | Attending: Emergency Medicine | Admitting: Emergency Medicine

## 2020-09-28 ENCOUNTER — Emergency Department: Payer: Medicare HMO

## 2020-09-28 ENCOUNTER — Other Ambulatory Visit: Payer: Self-pay

## 2020-09-28 DIAGNOSIS — G8929 Other chronic pain: Secondary | ICD-10-CM

## 2020-09-28 DIAGNOSIS — S61519A Laceration without foreign body of unspecified wrist, initial encounter: Secondary | ICD-10-CM

## 2020-09-28 DIAGNOSIS — X789XXA Intentional self-harm by unspecified sharp object, initial encounter: Secondary | ICD-10-CM

## 2020-09-28 DIAGNOSIS — T1491XA Suicide attempt, initial encounter: Secondary | ICD-10-CM

## 2020-09-28 DIAGNOSIS — R4182 Altered mental status, unspecified: Secondary | ICD-10-CM | POA: Diagnosis not present

## 2020-09-28 DIAGNOSIS — S61511A Laceration without foreign body of right wrist, initial encounter: Secondary | ICD-10-CM | POA: Insufficient documentation

## 2020-09-28 DIAGNOSIS — S6991XA Unspecified injury of right wrist, hand and finger(s), initial encounter: Secondary | ICD-10-CM | POA: Diagnosis present

## 2020-09-28 DIAGNOSIS — Z20822 Contact with and (suspected) exposure to covid-19: Secondary | ICD-10-CM | POA: Diagnosis not present

## 2020-09-28 DIAGNOSIS — S61512A Laceration without foreign body of left wrist, initial encounter: Secondary | ICD-10-CM | POA: Diagnosis not present

## 2020-09-28 LAB — COMPREHENSIVE METABOLIC PANEL
ALT: 37 U/L (ref 0–44)
AST: 45 U/L — ABNORMAL HIGH (ref 15–41)
Albumin: 4.5 g/dL (ref 3.5–5.0)
Alkaline Phosphatase: 44 U/L (ref 38–126)
Anion gap: 18 — ABNORMAL HIGH (ref 5–15)
BUN: 31 mg/dL — ABNORMAL HIGH (ref 8–23)
CO2: 19 mmol/L — ABNORMAL LOW (ref 22–32)
Calcium: 10 mg/dL (ref 8.9–10.3)
Chloride: 102 mmol/L (ref 98–111)
Creatinine, Ser: 2.15 mg/dL — ABNORMAL HIGH (ref 0.61–1.24)
GFR, Estimated: 20 mL/min — ABNORMAL LOW (ref >60)
Glucose, Bld: 147 mg/dL — ABNORMAL HIGH (ref 70–99)
Potassium: 5.1 mmol/L (ref 3.5–5.1)
Sodium: 139 mmol/L (ref 135–145)
Total Bilirubin: 1 mg/dL (ref 0.3–1.2)
Total Protein: 7.4 g/dL (ref 6.5–8.1)

## 2020-09-28 LAB — CBC
HCT: 46.1 % (ref 39.0–52.0)
Hemoglobin: 15.4 g/dL (ref 13.0–17.0)
MCH: 29.8 pg (ref 26.0–34.0)
MCHC: 33.4 g/dL (ref 30.0–36.0)
MCV: 89.3 fL (ref 80.0–100.0)
Platelets: 371 10*3/uL (ref 150–400)
RBC: 5.16 MIL/uL (ref 4.22–5.81)
RDW: 13.5 % (ref 11.5–15.5)
WBC: 15.9 10*3/uL — ABNORMAL HIGH (ref 4.0–10.5)
nRBC: 0 % (ref 0.0–0.2)

## 2020-09-28 LAB — RESP PANEL BY RT-PCR (FLU A&B, COVID) ARPGX2
Influenza A by PCR: NEGATIVE
Influenza B by PCR: NEGATIVE
SARS Coronavirus 2 by RT PCR: NEGATIVE

## 2020-09-28 LAB — TYPE AND SCREEN
ABO/RH(D): O POS
Antibody Screen: NEGATIVE

## 2020-09-28 LAB — SALICYLATE LEVEL

## 2020-09-28 LAB — TSH: TSH: 1.346 u[IU]/mL (ref 0.350–4.500)

## 2020-09-28 LAB — ACETAMINOPHEN LEVEL: Acetaminophen (Tylenol), Serum: 15 ug/mL (ref 10–30)

## 2020-09-28 LAB — ETHANOL: Alcohol, Ethyl (B): <10 mg/dL (ref <10)

## 2020-09-28 MED ORDER — HYDROCODONE-ACETAMINOPHEN 5-325 MG PO TABS
1.0000 | ORAL_TABLET | Freq: Four times a day (QID) | ORAL | Status: DC | PRN
  Administered 2020-09-28 – 2020-09-29 (×2): 1 via ORAL
  Filled 2020-09-28 (×2): qty 1

## 2020-09-28 NOTE — ED Notes (Signed)
Gave food tray with juice. 

## 2020-09-28 NOTE — BH Assessment (Addendum)
PATIENT IS SCHEDULED FOR ADMISSION AFTER 8AM TOMORROW    Patient has been accepted to [facility]  Patient assigned to: [place] physician is Dr. Selinda Flavin Call report to [phone] Representative was [facility]   ER Staff is aware of it:  [place], ER Secretary  Corky Downs, ER MD  Deneise Lever, Patient's Nurse  Patient refuses to provide family/support information at this time.

## 2020-09-28 NOTE — ED Notes (Addendum)
Per MD Jari Pigg, clean and rewrap pt arm wound at this time.

## 2020-09-28 NOTE — BH Assessment (Signed)
Comprehensive Clinical Assessment (CCA) Screening, Triage and Referral Note  09/28/2020 Perry Jones [id number]   Perry Jones is an 70 y.o male who presents to [facility] ED involuntarily for treatment. Per triage note, Pt presents via acems and BPD from suicide attempt. Pt states he slit his wrist last night in attempt to end his life. Pt with large amount of bright red blood on pants and arms. Pt noted to have cut "DNR" onto top of left hand. Pt states "You know like an animal you take them to put them down. Its just my time, we just don't have facilities like that". Pt calm but unwilling to give name and expand upon details.  During TTS assessment pt presents calm, alert, guarded, and oriented x 5, suspicious and uncooperative, and mood-congruent with affect. The pt does not appear to be responding to internal or external stimuli. Neither is the pt presenting with any delusional thinking. Pt was unable to verify the information provided to triage RN. Pt reports to have no concerns or needs stating, "I decided not to stay home yesterday and I guess the police found me in the park". Pt was very vague and short on information as he refuses to verify any personal information or current concerns at this time. Pt was originally admitted under Perry Jones however, identify was confirmed by LE as Perry Jones. Pt presents with cuts to both forearms and reports attempt to kill himself but refuses to provide any further information. Pt refused to cooperate with LE during their attempt to verify his personal information and is currently denying permission to contact family supports.   Per Dr. [name] Cooks pt meets criteria for INPT  Chief Complaint:  Chief Complaint  Patient presents with  . Suicide Attempt   Visit Diagnosis: Self-inflicted laceration of wrist   Patient Reported Information How did you hear about [place]? Legal System   Referral name: IVC'D   Referral phone number: No data recorded Whom do you see for  routine medical problems? I don't have a doctor   Practice/Facility Name: No data recorded  Practice/Facility Phone Number: No data recorded  Name of Contact: No data recorded  Contact Number: No data recorded  Contact Fax Number: No data recorded  Prescriber Name: No data recorded  Prescriber Address (if known): No data recorded What Is the Reason for Your Visit/Call Today? None reported "pt refused to report or confirm personal information upon arrival"  How Long Has This Been Causing You Problems? <Week  Have You Recently Been in Any Inpatient Treatment (Hospital/Detox/Crisis Center/28-Day Program)? No   Name/Location of Program/Hospital:No data recorded  How Long Were You There? No data recorded  When Were You Discharged? No data recorded Have You Ever Received Services From [facility] Before? No   Who Do You See at [facility]? No data recorded Have You Recently Had Any Thoughts About Hurting Yourself? Yes   Are You Planning to Commit Suicide/Harm Yourself At This time?  No  Have you Recently Had Thoughts About [place]? No   Explanation: No data recorded Have You Used Any Alcohol or Drugs in the Past 24 Hours? No   How Long Ago Did You Use Drugs or Alcohol?  No data recorded  What Did You Use and How Much? No data recorded What Do You Feel Would Help You the Most Today? Treatment for Depression or other mood problem  Do You Currently Have a Therapist/Psychiatrist? No   Name of Therapist/Psychiatrist: No data recorded  Have  You Been Recently Discharged From Any Office Practice or Programs? No   Explanation of Discharge From Practice/Program:  No data recorded    CCA Screening Triage Referral Assessment Type of Contact: Face-to-Face   Is this Initial or Reassessment? No data recorded  Date Telepsych consult ordered in [facility]:  No data recorded  Time Telepsych consult ordered in [facility]:  No data recorded Patient Reported Information Reviewed? Yes   Patient  Left Without Being Seen? No data recorded  Reason for Not Completing Assessment: No data recorded Collateral Involvement: None provided  Does Patient Have a [place]? No data recorded  Name and Contact of Legal Guardian:  No data recorded If Minor and Not Living with Parent(s), Who has Custody? n/a  Is CPS involved or ever been involved? -- (UTA)  Is APS involved or ever been involved? -- (UTA)  Patient Determined To Be At Risk for Harm To Self or Others Based on Review of Patient Reported Information or Presenting Complaint? No   Method: No data recorded  Availability of Means: No data recorded  Intent: No data recorded  Notification Required: No data recorded  Additional Information for Danger to Others Potential:  No data recorded  Additional Comments for Danger to Others Potential:  No data recorded  Are There Guns or Other Weapons in Your Home?  No data recorded   Types of Guns/Weapons: No data recorded   Are These Weapons Safely Secured?                              No data recorded   Who Could Verify You Are Able To Have These Secured:    No data recorded Do You Have any Outstanding Charges, Pending Court Dates, Parole/Probation? No data recorded Contacted To Inform of Risk of Harm To Self or Others: No data recorded Location of Assessment: [facility] ED  Does Patient Present under Involuntary Commitment? No data recorded  IVC Papers Initial File Date: No data recorded  [place] of Residence: [place]  Patient Currently Receiving the Following Services: Not Receiving Services   Determination of Need: Emergent (2 hours)   Options For Referral: Inpatient Hospitalization; Medication Management   Shanon Ace, LCSWA

## 2020-09-28 NOTE — ED Notes (Signed)
This RN made MD Jari Pigg aware of pt bleeding through bandages at this time. Awaiting further orders at this time.

## 2020-09-28 NOTE — Consult Note (Signed)
  This is an addendum to the previous consult note: Patient's daughter, evidently notified by police, came to visit him in the emergency room.  After the visit I asked her if she would like to tell me or anyone on the treatment team any information she had that could help [place] understand the situation.  I was careful not to give her any new information.  Daughter states that patient has not had previous psychiatric treatment but has long been somewhat moody and vaguely unhappy.  Has made some suicidal statements in the past that were worry some but did not result in treatment.  Main issue with him medically is his chronic pain.  I looked up his medication profile on the controlled substance database.  He has long been prescribed hydrocodone regularly for his chronic pain.  Interestingly, the last prescription that he filled was March 1.  This would suggest that either he has stretched out his medicine by taking less than the full prescription or he has been off of his pain medicine recently.  Drug screen not yet back.  Daughter also states that his current affect and behavior does not to her seem very normal for him.  No change to current treatment plan otherwise.  I do think it might be worthwhile to get a few other labs including a head CT or maybe even an MRI if it looks like there is been a real change in personality and behavior.

## 2020-09-28 NOTE — ED Notes (Signed)
MD Funke at bedside at this time.

## 2020-09-28 NOTE — ED Notes (Signed)
1 bag of Belongings Include:  1 black shirt  1 tan hat  1 pair of tan pants 1 pair of black socks  1 pair of black shoes  1 black belt   This RN and Deneise Lever, RN counted [phone] in front of the pt and it will be placed in hospital safe at this time.

## 2020-09-28 NOTE — ED Notes (Signed)
Pt accepted at [facility] to be transported 4.6.2022 at 8am

## 2020-09-28 NOTE — Consult Note (Signed)
[facility] Consult   Reason for Consult: Consult for 70 year old man with no known past psychiatric history who was brought to the hospital by EMS after being found having cut his wrists in public Referring Physician: Corky Downs Patient Identification: Perry Jones MRN:  [id number] Principal Diagnosis: Self-inflicted laceration of wrist [facility]) Diagnosis:  Principal Problem:   Self-inflicted laceration of wrist ([place]) Active Problems:   Suicide attempt ([place])   Chronic pain   Total Time spent with patient: 1 hour  Subjective:   Perry Jones is a 70 y.o. male patient admitted with "my only answer will be 'no'".  HPI: Patient seen.  Chart such as it is reviewed.  Patient was brought to the hospital by EMS after someone saw him in a local park bleeding having cut himself.  Patient had cut both of his forearms with what he describes as a soft.  He indicates that he had been attempting to kill himself.  He also indicates that he declines to cooperate with any work-up or evaluation or identification of himself.  He was initially brought in with no identity.  He has been matched to the identity of Perry Jones by police.  Officer came into the room while we were there and the patient essentially confirmed this identity but still would not cooperate with giving any further history.  He says he had spent the night in the park.  He admitted that he was from [place].  He mentioned having chronic pain particularly in his knee.  Patient was calm and polite but would not give any further information and made it clear that he did not want any treatment.  In fact he declined the emergency room physicians recommendation of suturing his arms.  He has allowed him to be wrapped in basic dressing.  Reviewing the chart now that we have found it there is no indication that I can find in any notes of any recent psychiatric concerns.  Patient is declining to give permission to contact his family.  Past  Psychiatric History: Nothing that is documented in the chart that I could find.  He seems to have a long history of chronic pain management with narcotics and Neurontin.  Also seems to have a long history of medication management of sleep.  One possibly interesting feature is that it is mentioned several times that he has refused appropriate treatment for obstructive sleep apnea despite there being a confirmed diagnosis.  Patient himself not willing to offer any information  Risk to Self:   Risk to Others:   Prior Inpatient Therapy:   Prior Outpatient Therapy:    Past Medical History: No past medical history on file.  Family History: No family history on file. Family Psychiatric  History: Nothing known Social History:  Social History   Substance and Sexual Activity  Alcohol Use Not on file     Social History   Substance and Sexual Activity  Drug Use Not on file    Social History   Socioeconomic History  . Marital status: Married    Spouse name: Not on file  . Number of children: Not on file  . Years of education: Not on file  . Highest education level: Not on file  Occupational History  . Not on file  Tobacco Use  . Smoking status: Not on file  . Smokeless tobacco: Not on file  Substance and Sexual Activity  . Alcohol use: Not on file  . Drug use: Not on file  . Sexual activity:  Not on file  Other Topics Concern  . Not on file  Social History Narrative  . Not on file   Social Determinants of Health   Financial Resource Strain: Not on file  Food Insecurity: Not on file  Transportation Needs: Not on file  Physical Activity: Not on file  Stress: Not on file  Social Connections: Not on file   Additional Social History:    Allergies:  No Known Allergies  Labs:  Results for orders placed or performed during the hospital encounter of 09/28/20 (from the past 48 hour(s))  Resp Panel by RT-PCR (Flu A&B, Covid) Nasopharyngeal Swab     Status: None   Collection Time:  09/28/20 10:00 AM   Specimen: Nasopharyngeal Swab; Nasopharyngeal(NP) swabs in vial transport medium  Result Value Ref Range   SARS Coronavirus 2 by RT PCR NEGATIVE NEGATIVE    Comment: (NOTE) SARS-CoV-2 target nucleic acids are NOT DETECTED.  The SARS-CoV-2 RNA is generally detectable in upper respiratory specimens during the acute phase of infection. The lowest concentration of SARS-CoV-2 viral copies this assay can detect is 138 copies/mL. A negative result does not preclude SARS-Cov-2 infection and should not be used as the sole basis for treatment or other patient management decisions. A negative result may occur with  improper specimen collection/handling, submission of specimen other than nasopharyngeal swab, presence of viral mutation(s) within the areas targeted by this assay, and inadequate number of viral copies(<138 copies/mL). A negative result must be combined with clinical observations, patient history, and epidemiological information. The expected result is Negative.  Fact Sheet for Patients:  EntrepreneurPulse.com.au  Fact Sheet for Healthcare Providers:  IncredibleEmployment.be  This test is no t yet approved or cleared by the [place] FDA and  has been authorized for detection and/or diagnosis of SARS-CoV-2 by FDA under an Emergency Use Authorization (EUA). This EUA will remain  in effect (meaning this test can be used) for the duration of the COVID-19 declaration under Section 564(b)(1) of the Act, 21 U.S.C.section 360bbb-3(b)(1), unless the authorization is terminated  or revoked sooner.       Influenza A by PCR NEGATIVE NEGATIVE   Influenza B by PCR NEGATIVE NEGATIVE    Comment: (NOTE) The Xpert Xpress SARS-CoV-2/FLU/RSV plus assay is intended as an aid in the diagnosis of influenza from Nasopharyngeal swab specimens and should not be used as a sole basis for treatment. Nasal washings and aspirates are  unacceptable for Xpert Xpress SARS-CoV-2/FLU/RSV testing.  Fact Sheet for Patients: EntrepreneurPulse.com.au  Fact Sheet for Healthcare Providers: IncredibleEmployment.be  This test is not yet approved or cleared by the [place] FDA and has been authorized for detection and/or diagnosis of SARS-CoV-2 by FDA under an Emergency Use Authorization (EUA). This EUA will remain in effect (meaning this test can be used) for the duration of the COVID-19 declaration under Section 564(b)(1) of the Act, 21 U.S.C. section 360bbb-3(b)(1), unless the authorization is terminated or revoked.  Performed at [facility], [address] [place], [place] [address]   Comprehensive metabolic panel     Status: Abnormal   Collection Time: 09/28/20 10:00 AM  Result Value Ref Range   Sodium 139 135 - 145 mmol/L   Potassium 5.1 3.5 - 5.1 mmol/L    Comment: HEMOLYSIS AT THIS LEVEL MAY AFFECT RESULT   Chloride 102 98 - 111 mmol/L   CO2 19 (L) 22 - 32 mmol/L   Glucose, Bld 147 (H) 70 - 99 mg/dL    Comment: Glucose reference  range applies only to samples taken after fasting for at least 8 hours.   BUN 31 (H) 8 - 23 mg/dL   Creatinine, Ser 2.15 (H) 0.61 - 1.24 mg/dL   Calcium 10.0 8.9 - 10.3 mg/dL   Total Protein 7.4 6.5 - 8.1 g/dL   Albumin 4.5 3.5 - 5.0 g/dL   AST 45 (H) 15 - 41 U/L   ALT 37 0 - 44 U/L   Alkaline Phosphatase 44 38 - 126 U/L   Total Bilirubin 1.0 0.3 - 1.2 mg/dL   GFR, Estimated 20 (L) >60 mL/min    Comment: (NOTE) Calculated using the CKD-EPI Creatinine Equation (2021)    Anion gap 18 (H) 5 - 15    Comment: Performed at [facility], [address] [place], [place] [address]  Ethanol     Status: None   Collection Time: 09/28/20 10:00 AM  Result Value Ref Range   Alcohol, Ethyl (B) <10 <10 mg/dL    Comment: (NOTE) Lowest detectable limit for serum alcohol is 10 mg/dL.  For medical purposes only. Performed at [facility], [address] [place], [place] [address]   Salicylate level     Status: None   Collection Time: 09/28/20 10:00 AM  Result Value Ref Range   Salicylate Lvl  7.0 - 65.7 mg/dL    SPECIMEN HEMOLYZED. HEMOLYSIS MAY AFFECT INTEGRITY OF RESULTS.    Comment: Performed at [facility], [address] [place], [place] [address]  Acetaminophen level     Status: None   Collection Time: 09/28/20 10:00 AM  Result Value Ref Range   Acetaminophen (Tylenol), Serum 15 10 - 30 ug/mL    Comment: (NOTE) Therapeutic concentrations vary significantly. A range of 10-30 ug/mL  may be an effective concentration for many patients. However, some  are best treated at concentrations outside of this range. Acetaminophen concentrations >150 ug/mL at 4 hours after ingestion  and >50 ug/mL at 12 hours after ingestion are often associated with  toxic reactions.  Performed at [facility], [address] [place], [place] [address]   cbc     Status: Abnormal   Collection Time: 09/28/20 10:00 AM  Result Value Ref Range   WBC 15.9 (H) 4.0 - 10.5 K/uL   RBC 5.16 4.22 - 5.81 MIL/uL   Hemoglobin 15.4 13.0 - 17.0 g/dL   HCT 46.1 39.0 - 52.0 %   MCV 89.3 80.0 - 100.0 fL   MCH 29.8 26.0 - 34.0 pg   MCHC 33.4 30.0 - 36.0 g/dL   RDW 13.5 11.5 - 15.5 %   Platelets 371 150 - 400 K/uL   nRBC 0.0 0.0 - 0.2 %    Comment: Performed at [facility], [address] [place], [place] [address]    No current facility-administered medications for this encounter.   No current outpatient medications on file.    Musculoskeletal: Strength & Muscle Tone: within normal limits Gait & Station: normal Patient leans: N/A            Psychiatric Specialty Exam:  Presentation  General Appearance: No data recorded Eye Contact:No data recorded Speech:No data recorded Speech Volume:No data recorded Handedness:No data recorded  Mood and Affect  Mood:No data  recorded Affect:No data recorded  Thought Process  Thought Processes:No data recorded Descriptions of Associations:No data recorded Orientation:No data recorded Thought Content:No data recorded History of Schizophrenia/Schizoaffective disorder:No data recorded Duration of Psychotic Symptoms:No data recorded Hallucinations:No data recorded Ideas of Reference:No  data recorded Suicidal Thoughts:No data recorded Homicidal Thoughts:No data recorded  Sensorium  Memory:No data recorded Judgment:No data recorded Insight:No data recorded  Executive Functions  Concentration:No data recorded Attention Span:No data recorded Recall:No data recorded [place] recorded Language:No data recorded  Psychomotor Activity  Psychomotor Activity:No data recorded  Assets  Assets:No data recorded  Sleep  Sleep:No data recorded  Physical Exam: Physical Exam Vitals and nursing note reviewed.  Constitutional:      Appearance: Normal appearance.  HENT:     Head: Normocephalic and atraumatic.     Mouth/Throat:     Pharynx: Oropharynx is clear.  Eyes:     Pupils: Pupils are equal, round, and reactive to light.  Cardiovascular:     Rate and Rhythm: Normal rate and regular rhythm.  Pulmonary:     Effort: Pulmonary effort is normal.     Breath sounds: Normal breath sounds.  Abdominal:     General: Abdomen is flat.     Palpations: Abdomen is soft.  Musculoskeletal:        General: Normal range of motion.  Skin:    General: Skin is warm and dry.       Neurological:     General: No focal deficit present.     Mental Status: He is alert. Mental status is at baseline.  Psychiatric:        Attention and Perception: Attention normal.        Mood and Affect: Affect is blunt.        Speech: He is noncommunicative.        Behavior: Behavior is not agitated, aggressive or hyperactive.    Review of Systems  Unable to perform ROS: Psychiatric disorder   Blood pressure 95/64,  pulse 84, temperature 98.9 F (37.2 C), temperature source Oral, resp. rate (!) 22, height 5\' 10"  (1.778 m), weight 90.7 kg, SpO2 94 %. Body mass index is 28.7 kg/m.  Treatment Plan Summary: Plan 70 year old man brought to the hospital with very serious lacerations of both arms and clear indication of suicidality.  Refusing to participate in psychiatric interview or give any other history.  Declining to give consent to have his family contacted.  Because of this I will not make the effort to reach out to his family however if they should contact us I would be able to listen to any information they can provide just not give any new information until he consents.  Patient has been placed under involuntary commitment.  I have informed the patient of this and that the plan will be to admit him to a psychiatric ward once we can find an appropriate bed and a facility that would accept him.  Labs reviewed.  Creatinine is a little over 2.  Not sure if that is off of baseline.  White count a little elevated.  I am not going to attempt to order or start any medication at this point without further history and I am sure the patient would probably not consent in any case.  Disposition: Recommend psychiatric Inpatient admission when medically cleared.  Alethia Berthold, MD 09/28/2020 11:45 AM

## 2020-09-28 NOTE — ED Triage Notes (Signed)
Pt presents via acems and [facility] from suicide attempt. Pt states he slit his wrist last night in attempt to end his life. Pt with large amount of bright red blood on pants and arms. Pt noted to have cut "DNR" onto top of left hand. Pt states "You know like an animal you take them to put them down,. Its just my time, we just dont have facilities like that". Pt calm but unwilling to give name and expand upon details.

## 2020-09-28 NOTE — ED Provider Notes (Signed)
[facility] Emergency Department Provider Note   ____________________________________________    I have reviewed the triage vital signs and the nursing notes.   HISTORY  Chief Complaint Suicide attempt    HPI Emet Rafanan is a 70 y.o. male who presents to the emergency department after cutting his wrists bilaterally he reports last night.  He states very clearly that he was trying to kill himself and is disappointed that he was unsuccessful.  He states that it is" time for him to die but unfortunately there are any places where you can be put down like an animal when it is time" so "I tried to do it myself"  Additionally the patient has not given any identifying information including his name.  He is a Jenny Reichmann Doe currently, hence no past medical history is available  No past medical history on file.  Patient Active Problem List   Diagnosis Date Noted  . Self-inflicted laceration of wrist ([place]) 09/28/2020  . Suicide attempt ([place]) 09/28/2020  . Chronic pain 09/28/2020     Prior to Admission medications   Not on File     Allergies Patient has no known allergies.  No family history on file.  Social History    Review of Systems limited patient uncooperative  Constitutional: No dizziness Eyes: No visual changes.  ENT: No sore throat. Cardiovascular: No chest pain Respiratory: No difficulty breathing Gastrointestinal:  no vomiting.   Genitourinary: No groin injury Musculoskeletal: Negative for back pain. Skin: Lacerations to the wrist Neurological: weakness   ____________________________________________   PHYSICAL EXAM:  VITAL SIGNS: ED Triage Vitals  Enc Vitals Group     BP      Pulse      Resp      Temp      Temp src      SpO2      Weight      Height      Head Circumference      Peak Flow      Pain Score      Pain Loc      Pain Edu?      Excl. in [place]?    Constitutional: Alert and oriented.   Head:  Atraumatic. Nose: No congestion/rhinnorhea. Mouth/Throat: Mucous membranes are moist.    Cardiovascular: Normal rate, regular rhythm. Kermit Balo peripheral circulation. Respiratory: Normal respiratory effort.  No retractions. Lungs CTAB. Gastrointestinal:No distention.  No CVA tenderness.  Musculoskeletal:  Warm and well perfused Neurologic:  Normal speech and language. No gross focal neurologic deficits are appreciated.  Skin:  Skin is warm, dry.  Right wrist: 6 cm horizontal laceration dorsally, no active bleeding, tendon visible appears intact, normal ROM.  Left wrist: 6 cm laceration with normal tendon functions, no active bleeding.  Psychiatric: Mood is calm, affect is normal, clear suicidality with intent  ____________________________________________   LABS (all labs ordered are listed, but only abnormal results are displayed)  Labs Reviewed  COMPREHENSIVE METABOLIC PANEL - Abnormal; Notable for the following components:      Result Value   CO2 19 (*)    Glucose, Bld 147 (*)    BUN 31 (*)    Creatinine, Ser 2.15 (*)    AST 45 (*)    GFR, Estimated 20 (*)    Anion gap 18 (*)    All other components within normal limits  CBC - Abnormal; Notable for the following components:   WBC 15.9 (*)    All other components  within normal limits  RESP PANEL BY RT-PCR (FLU A&B, COVID) ARPGX2  ETHANOL  SALICYLATE LEVEL  ACETAMINOPHEN LEVEL  TSH  URINE DRUG SCREEN, QUALITATIVE ([facility] ONLY)  SALICYLATE LEVEL  SALICYLATE LEVEL  VITAMIN B12  TESTOSTERONE  TYPE AND SCREEN   ____________________________________________  EKG  None ____________________________________________  RADIOLOGY  None ____________________________________________   PROCEDURES  Procedure(s) performed: No  ..Laceration Repair  Date/Time: 09/28/2020 3:51 PM Performed by: Lavonia Drafts, MD Authorized by: Lavonia Drafts, MD   Consent:    Consent obtained:  Verbal   Consent given by:  Patient   Risks  discussed:  Infection, pain, retained foreign body, poor cosmetic result, poor wound healing, tendon damage, vascular damage, need for additional repair and nerve damage   Alternatives discussed:  No treatment, observation and delayed treatment Anesthesia:    Anesthesia method:  Local infiltration   Local anesthetic:  Lidocaine 2% WITH epi Laceration details:    Location:  Shoulder/arm   Shoulder/arm location:  L lower arm   Length (cm):  6 Pre-procedure details:    Preparation:  Patient was prepped and draped in usual sterile fashion Exploration:    Hemostasis achieved with:  Direct pressure   Imaging outcome: foreign body not noted     Wound exploration: wound explored through full range of motion     Wound extent: vascular damage     Wound extent: no tendon damage noted     Contaminated: no   Treatment:    Area cleansed with:  Saline   Amount of cleaning:  Extensive   Irrigation solution:  Sterile saline   Visualized foreign bodies/material removed: no   Skin repair:    Repair method:  Sutures   Suture size:  4-0   Suture material:  Prolene   Suture technique:  Simple interrupted   Number of sutures:  5 Approximation:    Approximation:  Close Repair type:    Repair type:  Intermediate Post-procedure details:    Dressing:  Sterile dressing   Procedure completion:  Tolerated well, no immediate complications .Marland KitchenLaceration Repair  Date/Time: 09/28/2020 3:53 PM Performed by: Lavonia Drafts, MD Authorized by: Lavonia Drafts, MD   Consent:    Consent obtained:  Verbal and written   Consent given by:  Patient   Risks, benefits, and alternatives were discussed: yes     Risks discussed:  Infection, pain, retained foreign body, tendon damage, poor cosmetic result, nerve damage, need for additional repair, poor wound healing and vascular damage   Alternatives discussed:  No treatment, delayed treatment and observation Anesthesia:    Anesthesia method:  Local infiltration   Local  anesthetic:  Lidocaine 2% WITH epi Laceration details:    Location:  Shoulder/arm   Shoulder/arm location:  R lower arm   Length (cm):  6 Pre-procedure details:    Preparation:  Patient was prepped and draped in usual sterile fashion Exploration:    Contaminated: no   Treatment:    Irrigation solution:  Sterile saline and sterile water Skin repair:    Repair method:  Sutures   Suture size:  4-0   Suture material:  Prolene   Suture technique:  Simple interrupted   Number of sutures:  7 Approximation:    Approximation:  Close Repair type:    Repair type:  Intermediate Post-procedure details:    Dressing:  Non-adherent dressing   Procedure completion:  Tolerated well, no immediate complications     Critical Care performed: yes  CRITICAL CARE Performed by: Lavonia Drafts  Total critical care time: 30 minutes  Critical care time was exclusive of separately billable procedures and treating other patients.  Critical care was necessary to treat or prevent imminent or life-threatening deterioration.  Critical care was time spent personally by me on the following activities: development of treatment plan with patient and/or surrogate as well as nursing, discussions with consultants, evaluation of patient's response to treatment, examination of patient, obtaining history from patient or surrogate, ordering and performing treatments and interventions, ordering and review of laboratory studies, ordering and review of radiographic studies, pulse oximetry and re-evaluation of patient's condition.  ____________________________________________   INITIAL IMPRESSION / ASSESSMENT AND PLAN / ED COURSE  Pertinent labs & imaging results that were available during my care of the patient were reviewed by me and considered in my medical decision making (see chart for details).  Patient is refusing to give identifying information.  He is also refusing to have his lacerations sutured, he has  consented to nonsuture wound closure such as Steri-Strips.  He is refusing cleaning of the wounds, he states that he "absolutely does not care if they get infected "  The patient is demonstrating clear suicidality with a significant attempt.  We will IVC him, have discussed with the magistrate that we can place him under IVC as a Jenny Reichmann Doe initially and will correct information as we obtain.  ----------------------------------------- 3:51 PM on 09/28/2020 -----------------------------------------  Patient's daughter visited him and apparently convinced him to have his wounds repaired, see procedure note.  Patient only wants skin closure he does not want OR evaluation, warned him that deeper injuries could be possible including tendon and vascular injuries and he understands this.  He does have decisional capacity        ____________________________________________   FINAL CLINICAL IMPRESSION(S) / ED DIAGNOSES  Final diagnoses:  Suicide attempt ([place])  Suicidal behavior with attempted self-injury ([place])  Wrist laceration, unspecified laterality, initial encounter        Note:  This document was prepared using Dragon voice recognition software and may include unintentional dictation errors.   Lavonia Drafts, MD 09/28/20 [phone]

## 2020-09-28 NOTE — BH Assessment (Signed)
Referral information for Psychiatric Hospitalization faxed to:  [facility] [phone]) Per Angelena Form pt under review   [facility] [phone]),   [address] [phone]),   [place] [phone] -or- [phone]),   [place] [phone] or [phone])  [facility] [phone] or [phone])  . [place] [phone]),   [facility] [phone])

## 2020-09-29 LAB — URINE DRUG SCREEN, QUALITATIVE (ARMC ONLY)
Amphetamines, Ur Screen: NOT DETECTED
Barbiturates, Ur Screen: NOT DETECTED
Benzodiazepine, Ur Scrn: NOT DETECTED
Cannabinoid 50 Ng, Ur ~~LOC~~: NOT DETECTED
Cocaine Metabolite,Ur ~~LOC~~: NOT DETECTED
MDMA (Ecstasy)Ur Screen: NOT DETECTED
Methadone Scn, Ur: NOT DETECTED
Opiate, Ur Screen: POSITIVE — AB
Phencyclidine (PCP) Ur S: NOT DETECTED
Tricyclic, Ur Screen: NOT DETECTED

## 2020-09-29 LAB — TESTOSTERONE: Testosterone: 45 ng/dL — ABNORMAL LOW (ref 264–916)

## 2020-09-29 NOTE — Consult Note (Signed)
  Addendum: Just another quick addendum before the patient is transferred.  Head CT was performed and was all normal.  Additional labs performed include a testosterone which is 45, which is very much below the normal range for our lab.  Indicates a possible area for investigation and treatment intervention.

## 2020-09-29 NOTE — ED Notes (Signed)
Tech in room to obtain VS.

## 2020-09-29 NOTE — Consult Note (Signed)
Patient was verbally informed of the result of the CT scan and more importantly the testosterone level.  He expressed understanding of this.  I strongly advised him to discuss this with his treatment team after the hospital as it may be an area that could help with his condition.

## 2020-10-02 ENCOUNTER — Encounter: Payer: Self-pay | Admitting: Student in an Organized Health Care Education/Training Program

## 2020-10-05 ENCOUNTER — Encounter: Payer: Medicare HMO | Admitting: Student in an Organized Health Care Education/Training Program

## 2020-11-04 ENCOUNTER — Ambulatory Visit
Payer: Medicare HMO | Attending: Student in an Organized Health Care Education/Training Program | Admitting: Student in an Organized Health Care Education/Training Program

## 2020-11-04 ENCOUNTER — Encounter: Payer: Self-pay | Admitting: Student in an Organized Health Care Education/Training Program

## 2020-11-04 ENCOUNTER — Other Ambulatory Visit: Payer: Self-pay

## 2020-11-04 VITALS — BP 135/75 | HR 72 | Temp 97.2°F | Resp 16 | Ht 72.0 in | Wt 230.0 lb

## 2020-11-04 DIAGNOSIS — G5701 Lesion of sciatic nerve, right lower limb: Secondary | ICD-10-CM

## 2020-11-04 DIAGNOSIS — Z981 Arthrodesis status: Secondary | ICD-10-CM | POA: Diagnosis not present

## 2020-11-04 DIAGNOSIS — M961 Postlaminectomy syndrome, not elsewhere classified: Secondary | ICD-10-CM | POA: Diagnosis present

## 2020-11-04 DIAGNOSIS — M47816 Spondylosis without myelopathy or radiculopathy, lumbar region: Secondary | ICD-10-CM | POA: Diagnosis present

## 2020-11-04 DIAGNOSIS — G8929 Other chronic pain: Secondary | ICD-10-CM | POA: Diagnosis present

## 2020-11-04 DIAGNOSIS — M546 Pain in thoracic spine: Secondary | ICD-10-CM | POA: Insufficient documentation

## 2020-11-04 DIAGNOSIS — Z96651 Presence of right artificial knee joint: Secondary | ICD-10-CM

## 2020-11-04 DIAGNOSIS — M5416 Radiculopathy, lumbar region: Secondary | ICD-10-CM | POA: Insufficient documentation

## 2020-11-04 DIAGNOSIS — G894 Chronic pain syndrome: Secondary | ICD-10-CM | POA: Insufficient documentation

## 2020-11-04 MED ORDER — BUPRENORPHINE 5 MCG/HR TD PTWK: 1.0000 | MEDICATED_PATCH | TRANSDERMAL | 0 refills | Status: AC

## 2020-11-04 MED ORDER — BUPRENORPHINE 7.5 MCG/HR TD PTWK: 1.0000 | MEDICATED_PATCH | TRANSDERMAL | 0 refills | Status: DC

## 2020-11-04 NOTE — Patient Instructions (Addendum)
Cleanse your back with soap solution night before and morning of your SCS trial with [employer]  GENERAL RISKS AND COMPLICATIONS  What are the risk, side effects and possible complications? Generally speaking, most procedures are safe.  However, with any procedure there are risks, side effects, and the possibility of complications.  The risks and complications are dependent upon the sites that are lesioned, or the type of nerve block to be performed.  The closer the procedure is to the spine, the more serious the risks are.  Great care is taken when placing the radio frequency needles, block needles or lesioning probes, but sometimes complications can occur. 1. Infection: Any time there is an injection through the skin, there is a risk of infection.  This is why sterile conditions are used for these blocks.  There are four possible types of infection. 1. Localized skin infection. 2. Central Nervous System Infection-This can be in the form of Meningitis, which can be deadly. 3. Epidural Infections-This can be in the form of an epidural abscess, which can cause pressure inside of the spine, causing compression of the spinal cord with subsequent paralysis. This would require an emergency surgery to decompress, and there are no guarantees that the patient would recover from the paralysis. 4. Discitis-This is an infection of the intervertebral discs.  It occurs in about 1% of discography procedures.  It is difficult to treat and it may lead to surgery.        2. Pain: the needles have to go through skin and soft tissues, will cause soreness.       3. Damage to internal structures:  The nerves to be lesioned may be near blood vessels or    other nerves which can be potentially damaged.       4. Bleeding: Bleeding is more common if the patient is taking blood thinners such as  aspirin, Coumadin, Ticiid, Plavix, etc., or if he/she have some genetic predisposition  such as hemophilia. Bleeding into the spinal  canal can cause compression of the spinal  cord with subsequent paralysis.  This would require an emergency surgery to  decompress and there are no guarantees that the patient would recover from the  paralysis.       5. Pneumothorax:  Puncturing of a lung is a possibility, every time a needle is introduced in  the area of the chest or upper back.  Pneumothorax refers to free air around the  collapsed lung(s), inside of the thoracic cavity (chest cavity).  Another two possible  complications related to a similar event would include: Hemothorax and Chylothorax.   These are variations of the Pneumothorax, where instead of air around the collapsed  lung(s), you may have blood or chyle, respectively.       6. Spinal headaches: They may occur with any procedures in the area of the spine.       7. Persistent CSF (Cerebro-Spinal Fluid) leakage: This is a rare problem, but may occur  with prolonged intrathecal or epidural catheters either due to the formation of a fistulous  track or a dural tear.       8. Nerve damage: By working so close to the spinal cord, there is always a possibility of  nerve damage, which could be as serious as a permanent spinal cord injury with  paralysis.       9. Death:  Although rare, severe deadly allergic reactions known as "Anaphylactic  reaction" can occur to any of the medications used.  10. Worsening of the symptoms:  We can always make thing worse.  What are the chances of something like this happening? Chances of any of this occuring are extremely low.  By statistics, you have more of a chance of getting killed in a motor vehicle accident: while driving to the hospital than any of the above occurring .  Nevertheless, you should be aware that they are possibilities.  In general, it is similar to taking a shower.  Everybody knows that you can slip, hit your head and get killed.  Does that mean that you should not shower again?  Nevertheless always keep in mind that statistics  do not mean anything if you happen to be on the wrong side of them.  Even if a procedure has a 1 (one) in a 1,000,000 (million) chance of going wrong, it you happen to be that one..Also, keep in mind that by statistics, you have more of a chance of having something go wrong when taking medications.  Who should not have this procedure? If you are on a blood thinning medication (e.g. Coumadin, Plavix, see list of "Blood Thinners"), or if you have an active infection going on, you should not have the procedure.  If you are taking any blood thinners, please inform your physician.  How should I prepare for this procedure?  Do not eat or drink anything at least six hours prior to the procedure.  Bring a driver with you .  It cannot be a taxi.  Come accompanied by an adult that can drive you back, and that is strong enough to help you if your legs get weak or numb from the local anesthetic.  Take all of your medicines the morning of the procedure with just enough water to swallow them.  If you have diabetes, make sure that you are scheduled to have your procedure done first thing in the morning, whenever possible.  If you have diabetes, take only half of your insulin dose and notify our nurse that you have done so as soon as you arrive at the clinic.  If you are diabetic, but only take blood sugar pills (oral hypoglycemic), then do not take them on the morning of your procedure.  You may take them after you have had the procedure.  Do not take aspirin or any aspirin-containing medications, at least eleven (11) days prior to the procedure.  They may prolong bleeding.  Wear loose fitting clothing that may be easy to take off and that you would not mind if it got stained with Betadine or blood.  Do not wear any jewelry or perfume  Remove any nail coloring.  It will interfere with some of our monitoring equipment.  NOTE: Remember that this is not meant to be interpreted as a complete list of all  possible complications.  Unforeseen problems may occur.  BLOOD THINNERS The following drugs contain aspirin or other products, which can cause increased bleeding during surgery and should not be taken for 2 weeks prior to and 1 week after surgery.  If you should need take something for relief of minor pain, you may take acetaminophen which is found in Tylenol,m Datril, Anacin-3 and Panadol. It is not blood thinner. The products listed below are.  Do not take any of the products listed below in addition to any listed on your instruction sheet.  A.P.C or A.P.C with Codeine Codeine Phosphate Capsules #3 Ibuprofen Ridaura  ABC compound Congesprin Imuran rimadil  Advil Cope Indocin Robaxisal  Alka-Seltzer Effervescent Pain Reliever and Antacid Coricidin or Coricidin-D  Indomethacin Rufen  Alka-Seltzer plus Cold Medicine Cosprin Ketoprofen S-A-C Tablets  Anacin Analgesic Tablets or Capsules Coumadin Korlgesic Salflex  Anacin Extra Strength Analgesic tablets or capsules CP-2 Tablets Lanoril Salicylate  Anaprox Cuprimine Capsules Levenox Salocol  Anexsia-D Dalteparin Magan Salsalate  Anodynos Darvon compound Magnesium Salicylate Sine-off  Ansaid Dasin Capsules Magsal Sodium Salicylate  Anturane Depen Capsules Marnal Soma  APF Arthritis pain formula Dewitt's Pills Measurin Stanback  Argesic Dia-Gesic Meclofenamic Sulfinpyrazone  Arthritis Bayer Timed Release Aspirin Diclofenac Meclomen Sulindac  Arthritis pain formula Anacin Dicumarol Medipren Supac  Analgesic (Safety coated) Arthralgen Diffunasal Mefanamic Suprofen  Arthritis Strength Bufferin Dihydrocodeine Mepro Compound Suprol  Arthropan liquid Dopirydamole Methcarbomol with Aspirin Synalgos  ASA tablets/Enseals Disalcid Micrainin Tagament  Ascriptin Doan's Midol Talwin  Ascriptin A/D Dolene Mobidin Tanderil  Ascriptin Extra Strength Dolobid Moblgesic Ticlid  Ascriptin with Codeine Doloprin or Doloprin with Codeine Momentum Tolectin   Asperbuf Duoprin Mono-gesic Trendar  Aspergum Duradyne Motrin or Motrin IB Triminicin  Aspirin plain, buffered or enteric coated Durasal Myochrisine Trigesic  Aspirin Suppositories Easprin Nalfon Trillsate  Aspirin with Codeine Ecotrin Regular or Extra Strength Naprosyn Uracel  Atromid-S Efficin Naproxen Ursinus  Auranofin Capsules Elmiron Neocylate Vanquish  Axotal Emagrin Norgesic Verin  Azathioprine Empirin or Empirin with Codeine Normiflo Vitamin E  Azolid Emprazil Nuprin Voltaren  Bayer Aspirin plain, buffered or children's or timed BC Tablets or powders Encaprin Orgaran Warfarin Sodium  Buff-a-Comp Enoxaparin Orudis Zorpin  Buff-a-Comp with Codeine Equegesic Os-Cal-Gesic   Buffaprin Excedrin plain, buffered or Extra Strength Oxalid   Bufferin Arthritis Strength Feldene Oxphenbutazone   Bufferin plain or Extra Strength Feldene Capsules Oxycodone with Aspirin   Bufferin with Codeine Fenoprofen Fenoprofen Pabalate or Pabalate-SF   Buffets II Flogesic Panagesic   Buffinol plain or Extra Strength Florinal or Florinal with Codeine Panwarfarin   Buf-Tabs Flurbiprofen Penicillamine   Butalbital Compound Four-way cold tablets Penicillin   Butazolidin Fragmin Pepto-Bismol   Carbenicillin Geminisyn Percodan   Carna Arthritis Reliever Geopen Persantine   Carprofen Gold's salt Persistin   Chloramphenicol Goody's Phenylbutazone   Chloromycetin Haltrain Piroxlcam   Clmetidine heparin Plaquenil   Cllnoril Hyco-pap Ponstel   Clofibrate Hydroxy chloroquine Propoxyphen         Before stopping any of these medications, be sure to consult the physician who ordered them.  Some, such as Coumadin (Warfarin) are ordered to prevent or treat serious conditions such as "deep thrombosis", "pumonary embolisms", and other heart problems.  The amount of time that you may need off of the medication may also vary with the medication and the reason for which you were taking it.  If you are taking any of these  medications, please make sure you notify your pain physician before you undergo any procedures.          Moderate Conscious Sedation, Adult Sedation is the use of medicines to promote relaxation and to relieve discomfort and anxiety. Moderate conscious sedation is a type of sedation. Under moderate conscious sedation, you are less alert than normal, but you are still able to respond to instructions, touch, or both. Moderate conscious sedation is used during short medical and dental procedures. It is milder than deep sedation, which is a type of sedation under which you cannot be easily woken up. It is also milder than general anesthesia, which is the use of medicines to make you unconscious. Moderate conscious sedation allows you to return to your regular activities sooner. Tell  a health care provider about:  Any allergies you have.  All medicines you are taking, including vitamins, herbs, eye drops, creams, and over-the-counter medicines.  Any use of steroids. This includes steroids taken by mouth or as a cream.  Any problems you or family members have had with sedatives and anesthetic medicines.  Any blood disorders you have.  Any surgeries you have had.  Any medical conditions you have, such as sleep apnea.  Whether you are pregnant or may be pregnant.  Any use of cigarettes, alcohol, marijuana, or drugs. What are the risks? Generally, this is a safe procedure. However, problems may occur, including:  Getting too much medicine (oversedation).  Nausea.  Allergic reaction to medicines.  Trouble breathing. If this happens, a breathing tube may be used. It will be removed when you are awake and breathing on your own.  Heart trouble.  Lung trouble.  Confusion that gets better with time (emergence delirium). What happens before the procedure? Staying hydrated Follow instructions from your health care provider about hydration, which may include:  Up to 2 hours before  the procedure - you may continue to drink clear liquids, such as water, clear fruit juice, black coffee, and plain tea. Eating and drinking restrictions Follow instructions from your health care provider about eating and drinking, which may include:  8 hours before the procedure - stop eating heavy meals or foods, such as meat, fried foods, or fatty foods.  6 hours before the procedure - stop eating light meals or foods, such as toast or cereal.  6 hours before the procedure - stop drinking milk or drinks that contain milk.  2 hours before the procedure - stop drinking clear liquids. Medicines Ask your health care provider about:  Changing or stopping your regular medicines. This is especially important if you are taking diabetes medicines or blood thinners.  Taking medicines such as aspirin and ibuprofen. These medicines can thin your blood. Do not take these medicines unless your health care provider tells you to take them.  Taking over-the-counter medicines, vitamins, herbs, and supplements. Tests and exams  You will have a physical exam.  You may have blood tests done to show how well: ? Your kidneys and liver work. ? Your blood clots. General instructions  Plan to have a responsible adult take you home from the hospital or clinic.  If you will be going home right after the procedure, plan to have a responsible adult care for you for the time you are told. This is important. What happens during the procedure?  You will be given the sedative. The sedative may be given: ? As a pill that you will swallow. It can also be inserted into the rectum. ? As a spray through the nose. ? As an injection into the muscle. ? As an injection into the vein through an IV.  You may be given oxygen as needed.  Your breathing, heart rate, and blood pressure will be monitored during the procedure.  The medical or dental procedure will be done. The procedure may vary among health care  providers and hospitals.   What happens after the procedure?  Your blood pressure, heart rate, breathing rate, and blood oxygen level will be monitored until you leave the hospital or clinic.  You will get fluids through your IV if needed.  Do not drive or operate machinery until your health care provider says that it is safe. Summary  Sedation is the use of medicines to promote relaxation and  to relieve discomfort and anxiety. Moderate conscious sedation is a type of sedation that is used during short medical and dental procedures.  Tell the health care provider about any medical conditions that you have and about all the medicines that you are taking.  You will be given the sedative as a pill, a spray through the nose, an injection into the muscle, or an injection into the vein through an IV. Vital signs are monitored during the sedation.  Moderate conscious sedation allows you to return to your regular activities sooner. This information is not intended to replace advice given to you by your health care provider. Make sure you discuss any questions you have with your health care provider. Document Revised: 10/10/2019 Document Reviewed: 05/08/2019 Elsevier Patient Education  [employer].  Spinal Cord Stimulation Trial Information A spinal cord stimulation trial is a test to see whether a spinal cord stimulator reduces your pain. A spinal cord stimulator is a small device that is inserted (implanted) in your back. The stimulator has small wires (leads) that connect it to your spinal cord. The stimulator sends electrical pulses through the leads to the spinal cord. This can relieve pain. Settings for the stimulator can be adjusted with a remote device to find the best pain control. Your health care provider may suggest a spinal cord stimulation trial if other treatments for long-lasting (chronic) pain have not worked for you. Spinal cord stimulation may be used to manage pain that is  caused by:  Coronary artery disease or peripheral vascular disease.  Failed back surgery.  Phantom limb sensation.  Peripheral neuropathy.  Complex regional pain syndrome.  Other syndromes that involve chronic pain. For the trial, the stimulator is attached to your back instead of inserted under the skin. A trial period is usually 3-5 days, but this can vary among health care providers. After your trial period, you and your health care provider will discuss whether a permanent spinal cord stimulator is an option for you. The permanent stimulator may be an option depending on:  Whether the stimulator reduces your pain during the trial.  Whether the stimulator fits into your lifestyle.  Whether the cost of the stimulator is covered by your insurance. What are the risks? Generally, a spinal cord stimulation trial is safe. However, problems can occur, including:  Bleeding or pain at the insertion site of the leads.  Infection at the insertion site or around the leads.  Allergic reactions to medicines, devices, or dyes.  Damage to the skin, nerves, back muscles, or spinal cord where the leads are placed.  Inability to move the legs (paralysis).  Numbness in the legs.  Inability to control when you urinate or have a bowel movement (incontinence).  Spinal fluid leakage. How is a spinal cord stimulator placed for a trial? For a trial period, the stimulator is placed on your skin, not under it. Only the leads that connect the stimulator to the spinal cord are implanted under your skin. The exact location of the stimulator depends on where you have pain. There are two types of surgery for implanting the leads:  Noninvasive surgery. In this type of surgery, a small incision is made and needles are used to place the leads under your skin.  Open surgery. In this type of surgery, a larger incision is made, and the leads are implanted directly into your back.   How should I care for  myself during the trial period? Activity  Return to your normal activities  as told by your health care provider. Ask your health care provider what activities are safe for you.  Do not lift anything that is heavier than 10 lb (4.5 kg), or the limit that you are told. General instructions  Follow your health care provider's specific instructions about how to take care of your spinal cord stimulator and your incision.  Make sure you write down the following information so that you can share this information with your health care provider: ? Your responses to the stimulator. Describe these as told by your health care provider. ? Your pain level throughout the day. ? The amount and kind of pain medicine that you take.  Take over-the-counter and prescription medicines only as told by your health care provider.  Do not take baths, swim, or use a hot tub until your health care provider approves.  Tell all health care providers who provide care for you that you have a spinal cord stimulator. This is important information that could affect the medical treatment that you receive.  Keep all follow-up visits as told by your health care provider. This is important. When should I seek medical care? During the trial, seek medical care if:  You have redness, swelling, or pain around your incision.  You have fluid or blood coming from your incision.  Your incision feels warm to the touch.  You have pus or a bad smell coming from your incision.  The bandage (dressing) that covers your incision comes off. Get help right away if:  Your pain gets worse.  The stimulator leads come out.  You develop numbness or weakness in your legs, or you have difficulty walking.  You have problems urinating or having a bowel movement.  You have a fever.  You have symptoms that last for more than 2-3 days.  Your symptoms suddenly get worse. Summary  A spinal cord stimulator is a small device that sends  electrical pulses to your spinal cord. This can relieve pain caused by many different health conditions.  Before a permanent stimulator is placed, you will have a trial using a temporary stimulator that is not implanted under your skin. This helps determine if a stimulator will reduce your pain.  For the trial, only the leads that connect the stimulator to the spinal cord are implanted under your skin.  During the trial period, make sure you write down information about your pain and your responses to the stimulator so that you can share this information with your health care provider.  Keep all follow-up visits as told by your health care provider. This is important. Contact your health care provider if you have symptoms that indicate a problem. This information is not intended to replace advice given to you by your health care provider. Make sure you discuss any questions you have with your health care provider. Document Revised: 03/07/2019 Document Reviewed: 07/17/2018 Elsevier Patient Education  2021 [employer].

## 2020-11-04 NOTE — Progress Notes (Signed)
Nursing Pain Medication Assessment:  Safety precautions to be maintained throughout the outpatient stay will include: orient to surroundings, keep bed in low position, maintain call bell within reach at all times, provide assistance with transfer out of bed and ambulation.  Medication Inspection Compliance: Perry Jones did not comply with our request to bring his pills to be counted. He was reminded that bringing the medication bottles, even when empty, is a requirement.  Medication: None brought in. Pill/Patch Count: None available to be counted. Bottle Appearance: No container available. Did not bring bottle(s) to appointment. Filled Date: N/A Last Medication intake:  Today

## 2020-11-04 NOTE — Progress Notes (Signed)
PROVIDER NOTE: Information contained herein reflects review and annotations entered in association with encounter. Interpretation of such information and data should be left to medically-trained personnel. Information provided to patient can be located elsewhere in the medical record under "Patient Instructions". Document created using STT-dictation technology, any transcriptional errors that may result from process are unintentional.    Patient: Perry Jones  Service Category: E/M  Provider: Gillis Santa, MD  DOB: [birth year]  DOS: 11/04/2020  Specialty: Interventional Pain Management  MRN: [id number]  Setting: Ambulatory outpatient  PCP: Perry Pitch, MD  Type: Established Patient    Referring Provider: Juluis Pitch, MD  Location: Office  Delivery: Face-to-face     HPI  [name], a 70 y.o. 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 Perry Jones primary complain today is Back Pain (lower) and Knee Pain (right) Last encounter: My last encounter with him was on 07/21/2020. Pertinent problems: Perry Jones has Back muscle spasm; Chronic knee pain after total replacement of right knee joint; DDD (degenerative disc disease), lumbar; Essential hypertension; Chronic radicular lumbar pain; History of total right knee replacement; History of lumbar fusion (L1-L3); Chronic bilateral low back pain with bilateral sciatica; Chronic pain syndrome; Lumbar facet arthropathy; and Piriformis syndrome, right on their pertinent problem list. Pain Assessment: Severity of Chronic pain is reported as a 7 /10. Location: Back Lower,Right,Left/down legs ("one side or the other") to ankle; and RIGHT KNEE pain. Onset: More than a month ago. Quality: Sharp,Burning. Timing: Constant. Modifying factor(s): meds "only dull the pain". Vitals:  height is 6' (1.829 m) and weight is 230 lb (104.3 kg). His temporal temperature is 97.2 F (36.2 C) (abnormal). His blood pressure is 135/75  and his pulse is 72. His respiration is 16 and oxygen saturation is 100%.   Reason for encounter: medication management.   Patient presents today for medication management and postprocedural evaluation.  Unfortunately, he did not receive any benefit with his caudal epidural steroid injection in fact he states that increases pain for the first 3 days which then returned back to baseline.  Unfortunately, the patient did have a suicide attempt since his last visit with me.  He tried to lacerate both of his wrists.  Unsuccessful.  Had to have laceration sutured with subsequent suture removal.  States that he is in a better place mentally.  Do not recommend continued therapy with hydrocodone.  We will transition him to buprenorphine as below.  We also discussed spinal cord stimulator trial for failed back surgical syndrome, postlaminectomy pain syndrome.  Patient has a history of L3-S1 lumbar spinal fusion that has been refractory to medication management, physical therapy, caudal and spinal injections.  We had an extensive discussion today regarding spinal cord stimulator trial.  Patient has updated thoracic and lumbar MRI.  No evidence of thoracic canal stenosis to preclude safe percutaneous placement.  He has also completed his psychological evaluation with Dr. Lyman Jones.  We will try and obtain those results.  Otherwise we will try and get the patient scheduled for a Medtronic spinal cord stimulator trial.  We have evaluated his interlaminar windows under live fluoroscopy and he appears to have patent windows for a safe trial.  Pharmacotherapy Assessment   Analgesic: Discontinue hydrocodone, transition to buprenorphine with titrations instructions as below    Monitoring:  PMP: PDMP reviewed during this encounter.       Pharmacotherapy: No side-effects or adverse reactions reported. Compliance: No problems identified. Effectiveness: Clinically acceptable.  Perry Patience, RN  11/04/2020  2:22 PM   Sign when Signing Visit Nursing Pain Medication Assessment:  Safety precautions to be maintained throughout the outpatient stay will include: orient to surroundings, keep bed in low position, maintain call bell within reach at all times, provide assistance with transfer out of bed and ambulation.  Medication Inspection Compliance: Perry Jones did not comply with our request to bring his pills to be counted. He was reminded that bringing the medication bottles, even when empty, is a requirement.  Medication: None brought in. Pill/Patch Count: None available to be counted. Bottle Appearance: No container available. Did not bring bottle(s) to appointment. Filled Date: N/A Last Medication intake:  Today    UDS:  Summary  Date Value Ref Range Status  04/01/2020 Note  Final    Comment:    ==================================================================== Compliance Drug Analysis, Ur ==================================================================== Test                             Result       Flag       Units  Drug Present and Declared for Prescription Verification   Lorazepam                      54           EXPECTED   ng/mg creat    Source of lorazepam is a scheduled prescription medication.    Hydrocodone                    913          EXPECTED   ng/mg creat   Hydromorphone                  698          EXPECTED   ng/mg creat   Dihydrocodeine                 69           EXPECTED   ng/mg creat   Norhydrocodone                 1562         EXPECTED   ng/mg creat    Sources of hydrocodone include scheduled prescription medications.    Hydromorphone, dihydrocodeine and norhydrocodone are expected    metabolites of hydrocodone. Hydromorphone and dihydrocodeine are    also available as scheduled prescription medications.    Gabapentin                     PRESENT      EXPECTED   Orphenadrine                   PRESENT      EXPECTED   Zolpidem                       PRESENT       EXPECTED   Zolpidem Acid                  PRESENT      EXPECTED    Zolpidem acid is an expected metabolite of zolpidem.    Acetaminophen                  PRESENT      EXPECTED  Drug Absent but Declared for Prescription Verification  Cyclobenzaprine                Not Detected UNEXPECTED   Nortriptyline                  Not Detected UNEXPECTED ==================================================================== Test                      Result    Flag   Units      Ref Range   Creatinine              128              mg/dL      >=20 ==================================================================== Declared Medications:  The flagging and interpretation on this report are based on the  following declared medications.  Unexpected results may arise from  inaccuracies in the declared medications.   **Note: The testing scope of this panel includes these medications:   Cyclobenzaprine (Flexeril)  Gabapentin (Neurontin)  Hydrocodone (Norco)  Lorazepam (Ativan)  Nortriptyline (Pamelor)  Orphenadrine (Norflex)   **Note: The testing scope of this panel does not include small to  moderate amounts of these reported medications:   Acetaminophen (Norco)  Zolpidem (Ambien)   **Note: The testing scope of this panel does not include the  following reported medications:   Benazepril  Biotin  Hydrochlorothiazide  Loratadine  Multivitamin  Supplement ==================================================================== For clinical consultation, please call [phone]. ====================================================================      ROS  Constitutional: Denies any fever or chills Gastrointestinal: No reported hemesis, hematochezia, vomiting, or acute GI distress Musculoskeletal: Low back, bilateral leg pain Neurological: No reported episodes of acute onset apraxia, aphasia, dysarthria, agnosia, amnesia, paralysis, loss of coordination, or loss of  consciousness  Medication Review  Biotin, HYDROcodone-acetaminophen, Loratadine, Multi-Vitamins, benazepril-hydrochlorthiazide, buprenorphine, cyclobenzaprine, diclofenac Sodium, gabapentin, and zolpidem  History Review  Allergy: Mr. Arnaud is allergic to prednisone. Drug: Mr. Albornoz  reports no history of drug use. Alcohol:  reports current alcohol use. Tobacco:  reports that he quit smoking about 35 years ago. His smoking use included cigarettes. He has a 40.00 pack-year smoking history. He has never used smokeless tobacco. Social: Mr. Mckibben  reports that he quit smoking about 35 years ago. His smoking use included cigarettes. He has a 40.00 pack-year smoking history. He has never used smokeless tobacco. He reports current alcohol use. He reports that he does not use drugs. Medical:  has a past medical history of High cholesterol, Hypertension, and Sinus trouble. Surgical: Mr. Halls  has a past surgical history that includes right knee replaced (2007); Knee arthroscopy; L1-L4 fuzed; Joint replacement (Right); Back surgery; Colonoscopy with propofol (N/A, 11/22/2017); Cholecystectomy; and basal cell removal. Family: Family history is unknown by patient.  Laboratory Chemistry Profile   Renal Lab Results  Component Value Date   BUN 31 (H) 09/28/2020   CREATININE 2.15 (H) 09/28/2020   GFRAA >60 07/14/2018   GFRNONAA 20 (L) 09/28/2020     Hepatic Lab Results  Component Value Date   AST 45 (H) 09/28/2020   ALT 37 09/28/2020   ALBUMIN 4.5 09/28/2020   ALKPHOS 44 09/28/2020   LIPASE 28 07/14/2018     Electrolytes Lab Results  Component Value Date   NA 139 09/28/2020   K 5.1 09/28/2020   CL 102 09/28/2020   CALCIUM 10.0 09/28/2020     Bone Lab Results  Component Value Date   TESTOSTERONE 45 (L) 09/28/2020     Inflammation (CRP: Acute Phase) (ESR: Chronic Phase) No  results found for: CRP, ESRSEDRATE, LATICACIDVEN     Note: Above Lab results reviewed.  Recent Imaging Review   CT HEAD WO CONTRAST CLINICAL DATA:  Mental status change  EXAM: CT HEAD WITHOUT CONTRAST  TECHNIQUE: Contiguous axial images were obtained from the base of the skull through the vertex without intravenous contrast.  COMPARISON:  None.  FINDINGS: Brain: No evidence of acute infarction, hemorrhage, hydrocephalus, extra-axial collection or mass lesion/mass effect.  Vascular: Negative for hyperdense vessel  Skull: Negative  Sinuses/Orbits: Negative  Other: None  IMPRESSION: No acute abnormality.  Electronically Signed   By: Franchot Gallo M.D.   On: 09/28/2020 14:33 Note: Reviewed        Physical Exam  General appearance: Well nourished, well developed, and well hydrated. In no apparent acute distress Mental status: Alert, oriented x 3 (person, place, & time)       Respiratory: No evidence of acute respiratory distress Eyes: PERLA Vitals: BP 135/75   Pulse 72   Temp (!) 97.2 F (36.2 C) (Temporal)   Resp 16   Ht 6' (1.829 m)   Wt 230 lb (104.3 kg)   SpO2 100%   BMI 31.19 kg/m  BMI: Estimated body mass index is 31.19 kg/m as calculated from the following:   Height as of this encounter: 6' (1.829 m).   Weight as of this encounter: 230 lb (104.3 kg). Ideal: Ideal body weight: 77.6 kg (171 lb 1.2 oz) Adjusted ideal body weight: 88.3 kg (194 lb 10.3 oz)  Lumbar Exam  Skin & Axial Inspection:Well healed scar from previous spine surgery detected Alignment:Symmetrical Functional POE:UMPN restricted ROMaffecting both sides Stability:No instability detected Muscle Tone/Strength:Functionally intact. No obvious neuro-muscular anomalies detected. Sensory (Neurological):Musculoskeletal pain patterndermatomal pain pattern on the right  Provocative Tests: Hyperextension/rotation test:Unable to perform given fusion Lumbar quadrant test (Kemp's test):(+)bilaterally for foraminal stenosis Lateral bending test:(+)ipsilateral radicular pain, on the right.  Positive for right-sided foraminal stenosis.   Gait & Posture Assessment  Ambulation:Limited Gait:Antalgic Posture:Normal for his spinal fusion  Lower Extremity Exam    Side:Right lower extremity  Side:Left lower extremity  Stability:No instability observed  Stability:No instability observed  Skin & Extremity Inspection:Evidence of prior right knee arthroplastic surgery, right knee in brace  Skin & Extremity Inspection:Skin color, temperature, and hair growth are WNL. No peripheral edema or cyanosis. No masses, redness, swelling, asymmetry, or associated skin lesions. No contractures.  Functional TIR:WERX restricted ROMfor knee joint   Functional VQM:[id number] ROM   Muscle Tone/Strength:Functionally intact. No obvious neuro-muscular anomalies detected.  Muscle Tone/Strength:Functionally intact. No obvious neuro-muscular anomalies detected.  Sensory (Neurological):Neuropathic pain, L4  Sensory (Neurological):Neuropathic pain, L4  DTR: Patellar:deferred today Achilles:deferred today Plantar:deferred today  DTR: Patellar:deferred today Achilles:deferred today Plantar:deferred today  Palpation:No palpable anomalies  Palpation:No palpable anomalies     Assessment   Status Diagnosis  Persistent Persistent Persistent 1. Post laminectomy syndrome   2. History of lumbar fusion (L3-S1)   3. Failed back surgical syndrome   4. Thoracic spine pain   5. Chronic radicular lumbar pain   6. Lumbar facet arthropathy   7. Piriformis syndrome, right   8. History of total right knee replacement   9. Chronic pain syndrome      Updated Problems: Problem  Thoracic Spine Pain  Failed Back Surgical Syndrome    Plan of Care   Mr. Trevante Tennell has a current medication list which includes the following long-term medication(s): benazepril-hydrochlorthiazide, gabapentin, hydrocodone-acetaminophen,  hydrocodone-acetaminophen, loratadine, and zolpidem.  I discussed  percutaneous spinal cord stimulator trial with the patient in detail. I explained to the patient that they will have an external power source and programmer which the patient will use for 7 days. There will be daily communication with the stimulator company and the patient. A possible need for a mid-trial clinic visit to give the patient the best chance of success.   Patient has had thorough psychosocial behavioral evaluation with  Dr Perry Jones and has been cleared for SCS trial.  Some of patient's pain does seem to be mechanical in nature, with some component of neurogenic pain as well. We discussed the indications for spinal cord stimulation, specifically stating that it is typically better for neuropathic and appendicular pain, but that we have had some success in the treatment of low back and hip pain.   Patient is interested in proceeding with spinal cord stimulation trial. He understands that this may not be successful, and that spinal cord stimulation in general is not a "magic bullet."   We had a lengthy and very detailed discussion of all the risks, benefits, alternatives, and rationale of surgery as well as the option of continuing nonsurgical therapies. We specifically discussed the risks of temporary or permanent worsened neurologic injury, no symptomatic relief or pain made worse after procedure, and also the need for future surgery (due to infection, CSF leak, bleeding, adjacent segment issues, bone-healing difficulties, and other related issues). No guarantees of outcome were made or implied and he is eager to proceed and presents for definitive treatment.     [name] told me that all of his questions were answered thoroughly and to his satisfaction. Confidence and understanding of the discussed risks and consequences of  treatment was expressed and he accepted these risks and was eager to proceed with procedure.   Issues  concerning treatment and diagnosis were discussed with the patient. There are no barriers to understanding the plan of treatment. Explanation was well received by patient and/or family who then verbalized understanding.  In regards to medication management, we will transition him from hydrocodone to [facility] patch as below with titration instructions provided.   Pharmacotherapy (Medications Ordered): Meds ordered this encounter  Medications  . buprenorphine (BUTRANS) 5 MCG/HR PTWK    Sig: Place 1 patch onto the skin every 7 (seven) days.    Dispense:  4 patch    Refill:  0    For chronic pain syndrome  . buprenorphine (BUTRANS) 7.5 MCG/HR    Sig: Place 1 patch onto the skin every 7 (seven) days.    Dispense:  4 patch    Refill:  0    For chronic pain syndrome   Orders:  Orders Placed This Encounter  Procedures  . [facility] representative to notify them of the scheduled case and to make sure they will be available to provide required equipment.    Standing Status:   Future    Standing Expiration Date:   05/07/2021    Scheduling Instructions:     Side: Bilateral     Level: Lumbar     Device: Medtronic     Sedation: With sedation     Timeframe: As soon as pre-approved    Order Specific Question:   Where will this procedure be performed?    Answer:   [facility]   Follow-up plan:   Return in about 4 weeks (around 12/02/2020) for SCS trial (Medtronic).   Recent Visits No visits were found meeting  these conditions. Showing recent visits within past 90 days and meeting all other requirements Today's Visits Date Type Provider Dept  11/04/20 Office Visit Perry Santa, MD [facility]  Showing today's visits and meeting all other requirements Future Appointments Date Type Provider Dept  12/30/20 Appointment Perry Santa, MD [facility]  Showing future appointments within next 90 days and meeting all other  requirements  I discussed the assessment and treatment plan with the patient. The patient was provided an opportunity to ask questions and all were answered. The patient agreed with the plan and demonstrated an understanding of the instructions.  Patient advised to call back or seek an in-person evaluation if the symptoms or condition worsens.  Duration of encounter:30 minutes.  Note by: Perry Santa, MD Date: 11/04/2020; Time: 3:09 PM

## 2020-11-08 ENCOUNTER — Telehealth: Payer: Self-pay | Admitting: Student in an Organized Health Care Education/Training Program

## 2020-11-08 NOTE — Telephone Encounter (Signed)
PA was denied. Documentation sent to [facility] for appeal.

## 2020-11-08 NOTE — Telephone Encounter (Signed)
Unable to pick up meds sent in last week. Needs PA

## 2020-11-09 ENCOUNTER — Encounter: Payer: Self-pay | Admitting: Student in an Organized Health Care Education/Training Program

## 2020-11-15 ENCOUNTER — Encounter: Payer: Self-pay | Admitting: Student in an Organized Health Care Education/Training Program

## 2020-11-30 ENCOUNTER — Telehealth: Payer: Self-pay

## 2020-11-30 NOTE — Telephone Encounter (Signed)
Pt was scheduled for [place] Trial on 12/01/20. However he had to cancel his appt due to an Covid exposure. He is ready now to reschedule however I know that you have to coordinate before we can reschedule him.

## 2020-12-01 ENCOUNTER — Ambulatory Visit: Payer: Medicare HMO | Admitting: Student in an Organized Health Care Education/Training Program

## 2020-12-15 ENCOUNTER — Ambulatory Visit (HOSPITAL_BASED_OUTPATIENT_CLINIC_OR_DEPARTMENT_OTHER): Payer: Medicare HMO | Admitting: Student in an Organized Health Care Education/Training Program

## 2020-12-15 ENCOUNTER — Other Ambulatory Visit: Payer: Self-pay

## 2020-12-15 ENCOUNTER — Ambulatory Visit
Admission: RE | Admit: 2020-12-15 | Discharge: 2020-12-15 | Disposition: A | Discharge status: Home / Self Care | Payer: Medicare HMO | Source: Ambulatory Visit | Attending: Student in an Organized Health Care Education/Training Program | Admitting: Student in an Organized Health Care Education/Training Program

## 2020-12-15 VITALS — BP 132/95 | HR 83 | Temp 97.1°F | Resp 13 | Ht 72.0 in | Wt 227.0 lb

## 2020-12-15 DIAGNOSIS — M961 Postlaminectomy syndrome, not elsewhere classified: Secondary | ICD-10-CM | POA: Insufficient documentation

## 2020-12-15 DIAGNOSIS — Z981 Arthrodesis status: Secondary | ICD-10-CM | POA: Diagnosis present

## 2020-12-15 MED ORDER — FENTANYL CITRATE (PF) 100 MCG/2ML IJ SOLN
INTRAMUSCULAR | Status: AC
  Filled 2020-12-15: qty 2

## 2020-12-15 MED ORDER — CEPHALEXIN 500 MG PO CAPS: 500.0000 mg | ORAL_CAPSULE | Freq: Four times a day (QID) | ORAL | 0 refills | Status: AC

## 2020-12-15 MED ORDER — CEFAZOLIN SODIUM-DEXTROSE 2-4 GM/100ML-% IV SOLN
2.0000 g | Freq: Once | INTRAVENOUS | Status: AC
  Administered 2020-12-15: 2 g via INTRAVENOUS
  Filled 2020-12-15: qty 100

## 2020-12-15 MED ORDER — LIDOCAINE HCL 2 % IJ SOLN
INTRAMUSCULAR | Status: AC
  Filled 2020-12-15: qty 20

## 2020-12-15 MED ORDER — ROPIVACAINE HCL 2 MG/ML IJ SOLN
INTRAMUSCULAR | Status: AC
  Filled 2020-12-15: qty 20

## 2020-12-15 MED ORDER — LIDOCAINE HCL 2 % IJ SOLN
20.0000 mL | Freq: Once | INTRAMUSCULAR | Status: AC
  Administered 2020-12-15: 200 mg

## 2020-12-15 MED ORDER — CEFAZOLIN SODIUM 1 G IJ SOLR
INTRAMUSCULAR | Status: AC
  Filled 2020-12-15: qty 20

## 2020-12-15 MED ORDER — ROPIVACAINE HCL 2 MG/ML IJ SOLN
9.0000 mL | Freq: Once | INTRAMUSCULAR | Status: AC
  Administered 2020-12-15: 20 mL via PERINEURAL

## 2020-12-15 NOTE — Progress Notes (Signed)
PROVIDER NOTE: Information contained herein reflects review and annotations entered in association with encounter. Interpretation of such information and data should be left to medically-trained personnel. Information provided to patient can be located elsewhere in the medical record under "Patient Instructions". Document created using STT-dictation technology, any transcriptional errors that may result from process are unintentional.    Patient: Perry Jones  Service Category: Procedure  Provider: Gillis Santa, MD  DOB: 15-Jul-1950  DOS: 12/15/2020  Location: [facility]  MRN: [id number]  Setting: Ambulatory - outpatient  Referring Provider: Juluis Pitch, MD  Type: Established Patient  Specialty: Interventional Pain Management  PCP: Juluis Pitch, MD   Primary Reason for Admission: Surgical management of chronic pain condition.  Procedure:  Anesthesia, Analgesia, Anxiolysis:  Type: Trial Spinal Cord Neurostimulator Implant (Percutaneous, interlaminar, posterior epidural placement) MEDTRONIC Purpose: To determine if a permanent implant may be effective in controlling some or all of Perry Jones chronic pain symptoms.  Region: Lumbar Level: T12-L1 Laterality: Bilateral   Type: Local Anesthesia  Local Anesthetic: Lidocaine 1-2%   Indications: 1. Post laminectomy syndrome   2. History of lumbar fusion (L3-S1)   3. Failed back surgical syndrome    Pain Score: Pre-procedure: 7 /10 Post-procedure: 7 /10   Pre-op H&P Assessment:  Perry Jones is a 70 y.o. (year old), male patient, seen today for interventional treatment. He  has a past surgical history that includes right knee replaced (2007); Knee arthroscopy; L1-L4 fuzed; Joint replacement (Right); Back surgery; Colonoscopy with propofol (N/A, 11/22/2017); Cholecystectomy; and basal cell removal.  Initial Vital Signs:  Pulse/EKG Rate: 83ECG Heart Rate: 89 Temp: (!) 97.1 F (36.2 C) Resp: 18 BP: 120/75 SpO2: 97  %  BMI: Estimated body mass index is 30.79 kg/m as calculated from the following:   Height as of this encounter: 6' (1.829 m).   Weight as of this encounter: 227 lb (103 kg).  Risk Assessment: Allergies: Reviewed. He is allergic to prednisone.  Allergy Precautions: None required Coagulopathies: Reviewed. None identified.  Blood-thinner therapy: None at this time Active Infection(s): Reviewed. None identified. Perry Jones is afebrile  Site Confirmation: Perry Jones was asked to confirm the procedure and laterality before marking the site, which he did. Procedure checklist: Completed Consent: Before the procedure and under the influence of no sedative(s), amnesic(s), or anxiolytics, the patient was informed of the treatment options, risks and possible complications. To fulfill our ethical and legal obligations, as recommended by the American Medical Association's Code of Ethics, I have informed the patient of my clinical impression; the nature and purpose of the treatment or procedure; the risks, benefits, and possible complications of the intervention; the alternatives, including doing nothing; the risk(s) and benefit(s) of the alternative treatment(s) or procedure(s); and the risk(s) and benefit(s) of doing nothing.  Perry Jones was provided with information about the general risks and possible complications associated with most interventional procedures. These include, but are not limited to: failure to achieve desired goals, infection, bleeding, organ or nerve damage, allergic reactions, paralysis, and/or death.  In addition, he was informed of those risks and possible complications associated to this particular procedure, which include, but are not limited to: damage to the implant; failure to decrease pain; local, systemic, or serious CNS infections, intraspinal abscess with possible cord compression and paralysis, or life-threatening such as meningitis; intrathecal and/or epidural bleeding with  formation of hematoma with possible spinal cord compression and permanent paralysis; organ damage; nerve injury or damage with subsequent sensory, motor, and/or autonomic  system dysfunction, resulting in transient or permanent pain, numbness, and/or weakness of one or several areas of the body; allergic reactions, either minor or major life-threatening, such as anaphylactic or anaphylactoid reactions.  Furthermore, Perry Jones was informed of those risks and complications associated with the medications. These include, but are not limited to: allergic reactions (i.e.: anaphylactic or anaphylactoid reactions); arrhythmia;  Hypotension/hypertension; cardiovascular collapse; respiratory depression and/or shortness of breath; swelling or edema; medication-induced neural toxicity; particulate matter embolism and blood vessel occlusion with resultant organ, and/or nervous system infarction and permanent paralysis.  Finally, he was informed that Medicine is not an exact science; therefore, there is also the possibility of unforeseen or unpredictable risks and/or possible complications that may result in a catastrophic outcome. The patient indicated having understood very clearly. We have given the patient no guarantees and we have made no promises. Enough time was given to the patient to ask questions, all of which were answered to the patient's satisfaction. Perry Jones has indicated that he wanted to continue with the procedure. Attestation: I, the ordering provider, attest that I have discussed with the patient the benefits, risks, side-effects, alternatives, likelihood of achieving goals, and potential problems during recovery for the procedure that I have provided informed consent. Date  Time: 12/15/2020  9:32 AM  Pre-Procedure Preparation:  Monitoring: As per clinic protocol. Respiration, ETCO2, SpO2, BP, heart rate and rhythm monitor placed and checked for adequate function Safety Precautions: Patient was  assessed for positional comfort and pressure points before starting the procedure. Time-out: I initiated and conducted the "Time-out" before starting the procedure, as per protocol. The patient was asked to participate by confirming the accuracy of the "Time Out" information. Verification of the correct person, site, and procedure were performed and confirmed by me, the nursing staff, and the patient. "Time-out" conducted as per Joint Commission's Universal Protocol (UP.01.01.01). Time: 1016  Description of Procedure Process:   Position: Prone Target Area: Posterior epidural space Approach: Posterior percutaneous, paramedial, interlaminar approach Area Prepped: Bilateral thoraco-lumbar Region Prepping solution: ChloraPrep (2% chlorhexidine gluconate and 70% isopropyl alcohol) Safety Precautions: Safe injection practices and needle disposal techniques used. Medications properly checked for expiration dates. SDV (single dose vial) medications used. Aspiration looking for blood return and/or CSF was conducted prior to all injections. At no point did I inject any substances, as a needle was being advanced. No attempts were made at seeking any paresthesias.  Description of the Procedure: Availability of a responsible, adult driver, and NPO status confirmed. Informed consent was obtained after having discussed risks and possible complications. An IV was started. The patient was then taken to the fluoroscopy suite, where the patient was placed in position for the procedure, over the fluoroscopy table. The patient was then monitored in the usual manner. Fluoroscopy was manipulated to obtain the best possible view of the target. Parallex error was corrected before commencing the procedure. Once a clear view of the target had been obtained, the skin and deeper tissues over the procedure site were infiltrated using lidocaine, loaded in a 10 cc luer-loc syringe with a 0.5 inch, 25-G needle. The introducer needle(s)  was/were then inserted through the skin and deeper tissues. A paramidline approach was used to enter the posterior epidural space at a 30 angle, using "Loss-of-resistance Technique" with 3 ml of PF-NaCl (0.9% NSS). Correct needle placement was confirmed in the antero-posterior and lateral fluoroscopic views. The lead was gently introduced and manipulated under real-time fluoroscopy, constantly assessing for pain, discomfort, or paresthesias, until  the tip rested at the desired level. Both sides were done in identical fashion. Electrode placement was tested until appropriate coverage was attained. Once the patient confirmed that the stimulation was over the desired area, the lead(s) was/were secured in place and the introducer needles removed. This was done under real-time fluoroscopy while observing the electrode tip to avoid unintended migration. The area was covered with a non-occlusive dressing and the patient transported to recovery for further programming.  Vitals:   12/15/20 1034 12/15/20 1039 12/15/20 1044 12/15/20 1049  BP: (!) 153/98 (!) 149/93 (!) 143/105 (!) 132/95  Pulse:      Resp: 12 13 16 13   Temp:      TempSrc:      SpO2: 98% 97% 96% 97%  Weight:      Height:       Start Time: 1016 hrs. End Time: 1049 hrs.  Neurostimulator Details:   Lead(s):  Brand: Medtronic         Epidural Access Level:  T12-L1       same  Lead implant:  Bilateral   Laterality:  Left Right  Top electrode location:  T8       Mid T8  Model No.: [id number]       [id number]  Length: 60 cm       60  Lot No.: [id number]         [id number]  MRI compatibility:  Yes           External Neurostimulator    Model No.: [id number]   Serial No.: [id number]    Imaging Guidance (Spinal):          Type of Imaging Technique: Fluoroscopy Guidance (Spinal) Indication(s): Assistance in needle guidance and placement for procedures requiring needle placement in or near specific anatomical locations not easily accessible without  such assistance. Exposure Time: Please see nurses notes. Contrast: None used. Fluoroscopic Guidance: I was personally present during the use of fluoroscopy. "Tunnel Vision Technique" used to obtain the best possible view of the target area. Parallax error corrected before commencing the procedure. "Direction-depth-direction" technique used to introduce the needle under continuous pulsed fluoroscopy. Once target was reached, antero-posterior, oblique, and lateral fluoroscopic projection used confirm needle placement in all planes. Images permanently stored in EMR. Interpretation: No contrast injected. I personally interpreted the imaging intraoperatively. Adequate needle placement confirmed in multiple planes. Permanent images saved into the patient's record.      Antibiotic Prophylaxis:   Anti-infectives (From admission, onward)    Start     Dose/Rate Route Frequency Ordered Stop   12/15/20 1015  ceFAZolin (ANCEF) IVPB 2g/100 mL premix        2 g 200 mL/hr over 30 Minutes Intravenous  Once 12/15/20 1006 12/15/20 1036   12/15/20 0000  cephALEXin (KEFLEX) 500 MG capsule        500 mg Oral 4 times daily 12/15/20 1005 12/22/20 2359      Indication(s): Procedural Prophylaxis.  Post-operative Assessment:  Post-procedure Vital Signs:  Pulse/HCG Rate: 8391 Temp: (!) 97.1 F (36.2 C) Resp: 13 BP: (!) 132/95 SpO2: 97 %  Complications: No immediate post-treatment complications observed by team, or reported by patient.  Note: The patient tolerated the entire procedure well. A repeat set of vitals were taken after the procedure and the patient was kept under observation following institutional policy, for this type of procedure. Post-procedural neurological assessment was performed, showing return to baseline, prior to discharge. The patient was provided with post-procedure discharge instructions, including  a section on how to identify potential problems. Should any problems arise concerning  this procedure, the patient was given instructions to immediately contact us, at any time, without hesitation. In any case, we plan to contact the patient by telephone for a follow-up status report regarding this interventional procedure.  Comments:  No additional relevant information.  5 out of 5 strength bilateral lower extremity: Plantar flexion, dorsiflexion, knee flexion, knee extension.   Plan of Care  Orders:  Orders Placed This Encounter  Procedures   DG PAIN CLINIC C-ARM 1-60 MIN NO REPORT    Intraoperative interpretation by procedural physician at [place].    Standing Status:   Standing    Number of Occurrences:   1    Order Specific Question:   Reason for exam:    Answer:   Assistance in needle guidance and placement for procedures requiring needle placement in or near specific anatomical locations not easily accessible without such assistance.   Follow-up    Post-procedure Phone Call: Call patient tomorrow for routine early follow-up evaluation.  Return Appointment Timeframe: Approximately 6-7 days, to remove Trial leads.    Standing Status:   Standing    Number of Occurrences:   1    Order Specific Question:   Specify    Answer:   Schedule a return appointment for post-procedure evaluation. In addition arrange for patient to receive a follow-up phone call tomorrow to assess post-procedure status.    Medications administered: We administered lidocaine, ropivacaine (PF) 2 mg/mL (0.2%), and ceFAZolin.  See the medical record for exact dosing, route, and time of administration.  Follow-up plan:   Return in about 1 week (around 12/22/2020) for SCS lead pull.     Recent Visits Date Type Provider Dept  11/04/20 Office Visit Gillis Santa, MD [facility]  Showing recent visits within past 90 days and meeting all other requirements Today's Visits Date Type Provider Dept  12/15/20 Procedure visit Gillis Santa, MD [facility]  Showing  today's visits and meeting all other requirements Future Appointments Date Type Provider Dept  12/22/20 Appointment Gillis Santa, MD [facility]  12/30/20 Appointment Gillis Santa, MD [facility]  Showing future appointments within next 90 days and meeting all other requirements Disposition: Discharge home  Discharge (Date  Time): 12/15/2020; 1110 hrs.   Primary Care Physician: Juluis Pitch, MD Location: [facility] Note by: Gillis Santa, MD Date: 12/15/2020; Time: 11:31 AM

## 2020-12-15 NOTE — Progress Notes (Signed)
Safety precautions to be maintained throughout the outpatient stay will include: orient to surroundings, keep bed in low position, maintain call bell within reach at all times, provide assistance with transfer out of bed and ambulation.  

## 2020-12-15 NOTE — Patient Instructions (Signed)
Today we did the following -We have done a Spinal Cord Stimulator Trial with Medtronic  -As long as the leads are in place, do not bathe or shower. You may sponge bathe.  -While the lead is in place, please limit the bending, lifting, or twisting because the lead can move.  -The things we want to see is if your pain improves (and by what percentage), if you can do more activity (don't overdo it), and if you can use less of your "as needed" medicine. Do not stop long acting medicines like methadone, oxycontin, MS Contin, etc without checking with [place].  -It is VERY important that you pick up the antibiotics we prescribed, Keflex, on your way home from the trial and take them as prescribed(4 times a day), starting today, for as long as the lead is in place.  -The Spina Cord Stimulator Representative will be in contact with you while the lead is in place to make sure the trial goes as well as possible.  -Please contact us with any questions or concerns at any time during the trial.   -If you start running a fever over 100 degrees, have severe back pain, or new pain running down the legs, or drainage coming from the lead site, contact us immediately and/or go to the emergency room.  -Please do not restart any sort of medication that can thin your blood such as Aspirin, ibuprofen, motrin, aleve, plavix, coumadin, etc. If you aren't sure, call and ask.  -We will have you return in 7 days to have the lead removed. If this is successful, at that point we can go over the details about the permanent implant.

## 2020-12-16 ENCOUNTER — Telehealth: Payer: Self-pay | Admitting: *Deleted

## 2020-12-16 NOTE — Telephone Encounter (Signed)
No problems post procedure. 

## 2020-12-19 ENCOUNTER — Encounter: Payer: Self-pay | Admitting: Student in an Organized Health Care Education/Training Program

## 2020-12-20 ENCOUNTER — Encounter: Payer: Self-pay | Admitting: Student in an Organized Health Care Education/Training Program

## 2020-12-22 ENCOUNTER — Encounter: Payer: Self-pay | Admitting: Student in an Organized Health Care Education/Training Program

## 2020-12-22 ENCOUNTER — Ambulatory Visit (HOSPITAL_BASED_OUTPATIENT_CLINIC_OR_DEPARTMENT_OTHER): Payer: Medicare HMO | Admitting: Student in an Organized Health Care Education/Training Program

## 2020-12-22 ENCOUNTER — Ambulatory Visit
Admission: RE | Admit: 2020-12-22 | Discharge: 2020-12-22 | Disposition: A | Discharge status: Home / Self Care | Payer: Medicare HMO | Source: Ambulatory Visit | Attending: Student in an Organized Health Care Education/Training Program | Admitting: Student in an Organized Health Care Education/Training Program

## 2020-12-22 ENCOUNTER — Other Ambulatory Visit: Payer: Self-pay

## 2020-12-22 VITALS — BP 119/81 | HR 78 | Temp 97.7°F | Resp 16 | Ht 72.0 in | Wt 226.0 lb

## 2020-12-22 DIAGNOSIS — M5416 Radiculopathy, lumbar region: Secondary | ICD-10-CM

## 2020-12-22 DIAGNOSIS — M961 Postlaminectomy syndrome, not elsewhere classified: Secondary | ICD-10-CM | POA: Insufficient documentation

## 2020-12-22 DIAGNOSIS — Z981 Arthrodesis status: Secondary | ICD-10-CM | POA: Insufficient documentation

## 2020-12-22 DIAGNOSIS — G8929 Other chronic pain: Secondary | ICD-10-CM | POA: Insufficient documentation

## 2020-12-22 DIAGNOSIS — G894 Chronic pain syndrome: Secondary | ICD-10-CM

## 2020-12-22 MED ORDER — BUPRENORPHINE 5 MCG/HR TD PTWK: 1.0000 | MEDICATED_PATCH | TRANSDERMAL | 2 refills | Status: DC

## 2020-12-22 NOTE — Progress Notes (Signed)
Safety precautions to be maintained throughout the outpatient stay will include: orient to surroundings, keep bed in low position, maintain call bell within reach at all times, provide assistance with transfer out of bed and ambulation.  

## 2020-12-22 NOTE — Progress Notes (Signed)
PROVIDER NOTE: Information contained herein reflects review and annotations entered in association with encounter. Interpretation of such information and data should be left to medically-trained personnel. Information provided to patient can be located elsewhere in the medical record under "Patient Instructions". Document created using STT-dictation technology, any transcriptional errors that may result from process are unintentional.    Patient: Perry Jones  Service Category: E/M  Provider: Gillis Santa, MD  DOB: 1950/08/26  DOS: 12/22/2020  Specialty: Interventional Pain Management  MRN: [id number]  Setting: Ambulatory outpatient  PCP: Juluis Pitch, MD  Type: Established Patient    Referring Provider: Juluis Pitch, MD  Location: Office  Delivery: Face-to-face     HPI  [name], a 70 y.o. 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 [name] primary complain today is Back Pain Last encounter: My last encounter with him was on 12/15/2020. Pertinent problems: [name] has Back muscle spasm; Chronic knee pain after total replacement of right knee joint; DDD (degenerative disc disease), lumbar; Essential hypertension; Chronic radicular lumbar pain; History of total right knee replacement; History of lumbar fusion (L1-L3); Chronic bilateral low back pain with bilateral sciatica; Chronic pain syndrome; Lumbar facet arthropathy; and Piriformis syndrome, right on their pertinent problem list. Pain Assessment: Severity of Chronic pain is reported as a 4 /10. Location: Back Lower, Right/radiates down leg to foot. Onset: More than a month ago. Quality: Burning, Pins and needles. Timing: Intermittent. Modifying factor(s): radiates from left to right in the back. Vitals:  height is 6' (1.829 m) and weight is 226 lb (102.5 kg). His temporal temperature is 97.7 F (36.5 C). His blood pressure is 119/81 and his pulse is 78. His respiration is 16 and oxygen  saturation is 99%.   Reason for encounter: both, medication management and post-procedure assessment.     Perry Jones follows up today for postprocedural evaluation and removal of his Medtronic spinal cord stimulator trial leads.  He endorses a successful trial stating that he experienced greater than 75% pain relief and improvement in his ability to perform ADLs.  He would like to move forward with permanent implant.  I will send Perry Jones to Dr. Lacinda Axon for consideration of paddle versus percutaneous implant.  Given pain relief and functional improvement from his SCS trial, we discussed decreasing his Butrans patch from 7.5-->5 and patient would like to move forward with that.  I will see the patient back in 3 months for medication management at which point he will have his spinal cord stimulator implant which I can manage as well.   Pharmacotherapy Assessment  Analgesic:Decrease Butrans patch from 7.5 to 5 mcg an hour  Monitoring: [place] PMP: PDMP not reviewed this encounter.       Pharmacotherapy: No side-effects or adverse reactions reported. Compliance: No problems identified. Effectiveness: Clinically acceptable.  Perry Specking, RN  12/22/2020  8:47 AM  Sign when Signing Visit Safety precautions to be maintained throughout the outpatient stay will include: orient to surroundings, keep bed in low position, maintain call bell within reach at all times, provide assistance with transfer out of bed and ambulation.       UDS:  Summary  Date Value Ref Range Status  04/01/2020 Note  Final    Comment:    ==================================================================== Compliance Drug Analysis, Ur ==================================================================== Test  Result       Flag       Units  Drug Present and Declared for Prescription Verification   Lorazepam                      54           EXPECTED   ng/mg creat    Source of lorazepam is a scheduled  prescription medication.    Hydrocodone                    913          EXPECTED   ng/mg creat   Hydromorphone                  698          EXPECTED   ng/mg creat   Dihydrocodeine                 69           EXPECTED   ng/mg creat   Norhydrocodone                 1562         EXPECTED   ng/mg creat    Sources of hydrocodone include scheduled prescription medications.    Hydromorphone, dihydrocodeine and norhydrocodone are expected    metabolites of hydrocodone. Hydromorphone and dihydrocodeine are    also available as scheduled prescription medications.    Gabapentin                     PRESENT      EXPECTED   Orphenadrine                   PRESENT      EXPECTED   Zolpidem                       PRESENT      EXPECTED   Zolpidem Acid                  PRESENT      EXPECTED    Zolpidem acid is an expected metabolite of zolpidem.    Acetaminophen                  PRESENT      EXPECTED  Drug Absent but Declared for Prescription Verification   Cyclobenzaprine                Not Detected UNEXPECTED   Nortriptyline                  Not Detected UNEXPECTED ==================================================================== Test                      Result    Flag   Units      Ref Range   Creatinine              128              mg/dL      >=20 ==================================================================== Declared Medications:  The flagging and interpretation on this report are based on the  following declared medications.  Unexpected results may arise from  inaccuracies in the declared medications.   **Note: The testing scope of this panel includes these medications:   Cyclobenzaprine (Flexeril)  Gabapentin (Neurontin)  Hydrocodone (Norco)  Lorazepam (Ativan)  Nortriptyline (Pamelor)  Orphenadrine (  Norflex)   **Note: The testing scope of this panel does not include small to  moderate amounts of these reported medications:   Acetaminophen (Norco)  Zolpidem (Ambien)    **Note: The testing scope of this panel does not include the  following reported medications:   Benazepril  Biotin  Hydrochlorothiazide  Loratadine  Multivitamin  Supplement ==================================================================== For clinical consultation, please call [phone]. ====================================================================      ROS  Constitutional: Denies any fever or chills Gastrointestinal: No reported hemesis, hematochezia, vomiting, or acute GI distress Musculoskeletal:  Improvement in low back and radiating leg pain during duration of trial, rated as approximately 75% Neurological: No reported episodes of acute onset apraxia, aphasia, dysarthria, agnosia, amnesia, paralysis, loss of coordination, or loss of consciousness  Medication Review  Biotin, Loratadine, Multi-Vitamins, benazepril-hydrochlorthiazide, buprenorphine, cephALEXin, cyclobenzaprine, diclofenac Sodium, gabapentin, and zolpidem  History Review  Allergy: Mr. Cappuccio is allergic to prednisone. Drug: Mr. Acy  reports no history of drug use. Alcohol:  reports current alcohol use. Tobacco:  reports that he quit smoking about 35 years ago. His smoking use included cigarettes. He has a 40.00 pack-year smoking history. He has never used smokeless tobacco. Social: Mr. Ozaki  reports that he quit smoking about 35 years ago. His smoking use included cigarettes. He has a 40.00 pack-year smoking history. He has never used smokeless tobacco. He reports current alcohol use. He reports that he does not use drugs. Medical:  has a past medical history of High cholesterol, Hypertension, and Sinus trouble. Surgical: Mr. Liszewski  has a past surgical history that includes right knee replaced (2007); Knee arthroscopy; L1-L4 fuzed; Joint replacement (Right); Back surgery; Colonoscopy with propofol (N/A, 11/22/2017); Cholecystectomy; and basal cell removal. Family: Family history is unknown by  patient.  Laboratory Chemistry Profile   Renal Lab Results  Component Value Date   BUN 31 (H) 09/28/2020   CREATININE 2.15 (H) 09/28/2020   GFRAA >60 07/14/2018   GFRNONAA 20 (L) 09/28/2020    Hepatic Lab Results  Component Value Date   AST 45 (H) 09/28/2020   ALT 37 09/28/2020   ALBUMIN 4.5 09/28/2020   ALKPHOS 44 09/28/2020   LIPASE 28 07/14/2018    Electrolytes Lab Results  Component Value Date   NA 139 09/28/2020   K 5.1 09/28/2020   CL 102 09/28/2020   CALCIUM 10.0 09/28/2020    Bone Lab Results  Component Value Date   TESTOSTERONE 45 (L) 09/28/2020    Inflammation (CRP: Acute Phase) (ESR: Chronic Phase) No results found for: CRP, ESRSEDRATE, LATICACIDVEN        Note: Above Lab results reviewed.    Physical Exam  General appearance: Well nourished, well developed, and well hydrated. In no apparent acute distress Mental status: Alert, oriented x 3 (person, place, & time)       Respiratory: No evidence of acute respiratory distress Eyes: PERLA Vitals: BP 119/81 (BP Location: Right Arm, Patient Position: Sitting, Cuff Size: Normal)   Pulse 78   Temp 97.7 F (36.5 C) (Temporal)   Resp 16   Ht 6' (1.829 m)   Wt 226 lb (102.5 kg)   SpO2 99%   BMI 30.65 kg/m  BMI: Estimated body mass index is 30.65 kg/m as calculated from the following:   Height as of this encounter: 6' (1.829 m).   Weight as of this encounter: 226 lb (102.5 kg). Ideal: Ideal body weight: 77.6 kg (171 lb 1.2 oz) Adjusted ideal body weight: 87.6 kg (193 lb 0.7 oz)  Improvement in low back and radiating leg pain during duration of trial Spinal cord stimulator trial leads removed under live fluoroscopy, tips intact 5 out of 5 strength bilateral lower extremity: Plantar flexion, dorsiflexion, knee flexion, knee extension.   Assessment   Status Diagnosis  Controlled Controlled Controlled 1. Post laminectomy syndrome   2. History of lumbar fusion (L3-S1)   3. Failed back surgical  syndrome   4. Chronic radicular lumbar pain   5. Chronic pain syndrome       Plan of Care   1.  Successful Medtronic spinal cord stimulator trial, referral to Dr. Lacinda Axon for discussion of percutaneous versus paddle implant 2.  Reduce Butrans patch from 7.5 to 5 mcg an hour.  New prescription sent in.  Follow-up in 3 months for medication management.  PMP checked and appropriate.  Pharmacotherapy (Medications Ordered): Meds ordered this encounter  Medications   buprenorphine (BUTRANS) 5 MCG/HR PTWK    Sig: Place 1 patch onto the skin once a week.    Dispense:  4 patch    Refill:  2    Chronic Pain: STOP Act (Not applicable) Fill 1 day early if closed on refill date. Avoid benzodiazepines within 8 hours of opioids   Orders:  Orders Placed This Encounter  Procedures   DG PAIN CLINIC C-ARM 1-60 MIN NO REPORT    Intraoperative interpretation by procedural physician at [place].    Standing Status:   Standing    Number of Occurrences:   1    Order Specific Question:   Reason for exam:    Answer:   Assistance in needle guidance and placement for procedures requiring needle placement in or near specific anatomical locations not easily accessible without such assistance.   Ambulatory referral to Neurosurgery    Referral Priority:   Routine    Referral Type:   Surgical    Referral Reason:   Specialty Services Required    Referred to Provider:   [name], MD    Number of Visits Requested:   1   Follow-up plan:   Return in about 3 months (around 03/24/2021) for Medication Management, in person.    Recent Visits Date Type Provider Dept  12/15/20 Procedure visit Gillis Santa, MD [facility]  11/04/20 Office Visit Gillis Santa, MD [facility]  Showing recent visits within past 90 days and meeting all other requirements Today's Visits Date Type Provider Dept  12/22/20 Procedure visit Gillis Santa, MD [facility]  Showing today's visits  and meeting all other requirements Future Appointments Date Type Provider Dept  03/17/21 Appointment Gillis Santa, MD [facility]  Showing future appointments within next 90 days and meeting all other requirements I discussed the assessment and treatment plan with the patient. The patient was provided an opportunity to ask questions and all were answered. The patient agreed with the plan and demonstrated an understanding of the instructions.  Patient advised to call back or seek an in-person evaluation if the symptoms or condition worsens.  Duration of encounter: 43mnutes.  Note by: BGillis Santa MD Date: 12/22/2020; Time: 9:46 AM

## 2020-12-27 ENCOUNTER — Encounter: Payer: Self-pay | Admitting: Student in an Organized Health Care Education/Training Program

## 2020-12-30 ENCOUNTER — Encounter: Payer: Medicare HMO | Admitting: Student in an Organized Health Care Education/Training Program

## 2021-01-04 ENCOUNTER — Other Ambulatory Visit: Payer: Self-pay | Admitting: Pain Medicine

## 2021-01-04 ENCOUNTER — Encounter: Payer: Self-pay | Admitting: Student in an Organized Health Care Education/Training Program

## 2021-01-04 DIAGNOSIS — G894 Chronic pain syndrome: Secondary | ICD-10-CM

## 2021-01-04 DIAGNOSIS — Z79899 Other long term (current) drug therapy: Secondary | ICD-10-CM

## 2021-01-04 DIAGNOSIS — Z79891 Long term (current) use of opiate analgesic: Secondary | ICD-10-CM

## 2021-01-04 MED ORDER — HYDROCODONE-ACETAMINOPHEN 10-325 MG PO TABS: 1.0000 | ORAL_TABLET | Freq: Three times a day (TID) | ORAL | 0 refills | Status: DC | PRN

## 2021-01-04 NOTE — Progress Notes (Signed)
Today I sent a prescription for hydrocodone for 7 days, while covering for Dr. Gillis Santa.

## 2021-01-20 ENCOUNTER — Encounter: Payer: Self-pay | Admitting: Student in an Organized Health Care Education/Training Program

## 2021-01-20 ENCOUNTER — Ambulatory Visit
Payer: Medicare HMO | Attending: Student in an Organized Health Care Education/Training Program | Admitting: Student in an Organized Health Care Education/Training Program

## 2021-01-20 ENCOUNTER — Other Ambulatory Visit: Payer: Self-pay

## 2021-01-20 DIAGNOSIS — Z79899 Other long term (current) drug therapy: Secondary | ICD-10-CM | POA: Diagnosis not present

## 2021-01-20 DIAGNOSIS — Z79891 Long term (current) use of opiate analgesic: Secondary | ICD-10-CM | POA: Diagnosis not present

## 2021-01-20 DIAGNOSIS — G894 Chronic pain syndrome: Secondary | ICD-10-CM | POA: Diagnosis not present

## 2021-01-20 MED ORDER — HYDROCODONE-ACETAMINOPHEN 10-325 MG PO TABS: 1.0000 | ORAL_TABLET | Freq: Three times a day (TID) | ORAL | 0 refills | Status: DC | PRN

## 2021-01-20 NOTE — Progress Notes (Signed)
Patient: Perry Jones  Service Category: E/M  Provider: Gillis Santa, MD  DOB: [birth year]  DOS: 01/20/2021  Location: Office  MRN: [id number]  Setting: Ambulatory outpatient  Referring Provider: Juluis Pitch, MD  Type: Established Patient  Specialty: Interventional Pain Management  PCP: Juluis Pitch, MD  Location: Home  Delivery: TeleHealth     Virtual Encounter - Pain Management PROVIDER NOTE: Information contained herein reflects review and annotations entered in association with encounter. Interpretation of such information and data should be left to medically-trained personnel. Information provided to patient can be located elsewhere in the medical record under "Patient Instructions". Document created using STT-dictation technology, any transcriptional errors that may result from process are unintentional.    Contact & Pharmacy Preferred: [phone] Home: There is no home phone number on file. Mobile: [phone] (mobile) E-mail: [email or web address]  [facility] [phone] #[id number], [place] [address] [place] Phone: [phone] Fax: [phone]   Pre-screening  Perry Jones offered "in-person" vs "virtual" encounter. He indicated preferring virtual for this encounter.   Reason COVID-19*  Social distancing based on CDC and AMA recommendations.   I contacted Perry Jones on 01/20/2021 via video conference.      I clearly identified myself as 70/03/2021, MD. I verified that I was speaking with the correct person using two identifiers (Name: Perry Jones, and date of birth: Jan 16, 1951).  Consent I sought verbal advanced consent from 70/02/1951 for virtual visit interactions. I informed Perry Jones of possible security and privacy concerns, risks, and limitations associated with providing "not-in-person" medical evaluation and management services. I also informed Perry Jones of the  availability of "in-person" appointments. Finally, I informed him that there would be a charge for the virtual visit and that he could be  personally, fully or partially, financially responsible for it. Perry Jones expressed understanding and agreed to proceed.   Historic Elements   Perry Jones is a 70 y.o. year old, male patient evaluated today after our last contact on 12/22/2020. Perry Jones  has a past medical history of Dysplastic nevus (03/11/2020), Dysplastic nevus (03/12/2019), High cholesterol, Hypertension, and Sinus trouble. He also  has a past surgical history that includes right knee replaced (2007); Knee arthroscopy; L1-L4 fuzed; Joint replacement (Right); Back surgery; Colonoscopy with propofol (N/A, 11/22/2017); Cholecystectomy; and basal cell removal. Perry Jones has a current medication list which includes the following prescription(s): benazepril-hydrochlorthiazide, biotin, cyclobenzaprine, diclofenac sodium, gabapentin, loratadine, multi-vitamins, zolpidem, buprenorphine, hydrocodone-acetaminophen, and [START ON 02/19/2021] hydrocodone-acetaminophen. He  reports that he quit smoking about 36 years ago. His smoking use included cigarettes. He has a 40.00 pack-year smoking history. He has never used smokeless tobacco. He reports current alcohol use. He reports that he does not use drugs. Perry Jones is allergic to prednisone.   HPI  Today, he is being contacted for medication management.  Did not experience much benefit with buprenorphine transdermal patch so he has discontinued that. Refill of Hydrocodone.  S/P successful SCS trial, awaiting consult with Dr Tempie Donning perm implant  Pharmacotherapy Assessment   Analgesic: hydrocodone 10 mg BID prn    Monitoring: [place] PMP: PDMP reviewed during this encounter.       Pharmacotherapy: No side-effects or adverse reactions reported. Compliance: No problems identified. Effectiveness: Clinically acceptable. Plan: Refer to "POC". UDS:  Summary   Date Value Ref Range Status  04/01/2020 Note  Final    Comment:    ====================================================================  Compliance Drug Analysis, Ur ==================================================================== Test                             Result       Flag       Units  Drug Present and Declared for Prescription Verification   Lorazepam                      54           EXPECTED   ng/mg creat    Source of lorazepam is a scheduled prescription medication.    Hydrocodone                    913          EXPECTED   ng/mg creat   Hydromorphone                  698          EXPECTED   ng/mg creat   Dihydrocodeine                 69           EXPECTED   ng/mg creat   Norhydrocodone                 1562         EXPECTED   ng/mg creat    Sources of hydrocodone include scheduled prescription medications.    Hydromorphone, dihydrocodeine and norhydrocodone are expected    metabolites of hydrocodone. Hydromorphone and dihydrocodeine are    also available as scheduled prescription medications.    Gabapentin                     PRESENT      EXPECTED   Orphenadrine                   PRESENT      EXPECTED   Zolpidem                       PRESENT      EXPECTED   Zolpidem Acid                  PRESENT      EXPECTED    Zolpidem acid is an expected metabolite of zolpidem.    Acetaminophen                  PRESENT      EXPECTED  Drug Absent but Declared for Prescription Verification   Cyclobenzaprine                Not Detected UNEXPECTED   Nortriptyline                  Not Detected UNEXPECTED ==================================================================== Test                      Result    Flag   Units      Ref Range   Creatinine              128              mg/dL      >=20 ==================================================================== Declared Medications:  The flagging and interpretation on this report are based on the  following declared medications.   Unexpected results may arise from  inaccuracies  in the declared medications.   **Note: The testing scope of this panel includes these medications:   Cyclobenzaprine (Flexeril)  Gabapentin (Neurontin)  Hydrocodone (Norco)  Lorazepam (Ativan)  Nortriptyline (Pamelor)  Orphenadrine (Norflex)   **Note: The testing scope of this panel does not include small to  moderate amounts of these reported medications:   Acetaminophen (Norco)  Zolpidem (Ambien)   **Note: The testing scope of this panel does not include the  following reported medications:   Benazepril  Biotin  Hydrochlorothiazide  Loratadine  Multivitamin  Supplement ==================================================================== For clinical consultation, please call [phone]. ====================================================================      Laboratory Chemistry Profile   Renal Lab Results  Component Value Date   BUN 31 (H) 09/28/2020   CREATININE 2.15 (H) 09/28/2020   GFRAA >60 07/14/2018   GFRNONAA 20 (L) 09/28/2020    Hepatic Lab Results  Component Value Date   AST 45 (H) 09/28/2020   ALT 37 09/28/2020   ALBUMIN 4.5 09/28/2020   ALKPHOS 44 09/28/2020   LIPASE 28 07/14/2018    Electrolytes Lab Results  Component Value Date   NA 139 09/28/2020   K 5.1 09/28/2020   CL 102 09/28/2020   CALCIUM 10.0 09/28/2020    Bone Lab Results  Component Value Date   TESTOSTERONE 45 (L) 09/28/2020    Inflammation (CRP: Acute Phase) (ESR: Chronic Phase) No results found for: CRP, ESRSEDRATE, LATICACIDVEN       Note: Above Lab results reviewed.   Assessment  Diagnoses of Chronic pain syndrome, Pharmacologic therapy, and Chronic use of opiate for therapeutic purpose were pertinent to this visit.  Plan of Care   Perry Jones has a current medication list which includes the following long-term medication(s): benazepril-hydrochlorthiazide, gabapentin, loratadine, zolpidem,  hydrocodone-acetaminophen, and [START ON 02/19/2021] hydrocodone-acetaminophen.  Pharmacotherapy (Medications Ordered): Meds ordered this encounter  Medications   HYDROcodone-acetaminophen (NORCO) 10-325 MG tablet    Sig: Take 1 tablet by mouth every 8 (eight) hours as needed for severe pain. Must last 30 days.    Dispense:  60 tablet    Refill:  0    Chronic Pain: STOP Act (Not applicable) Fill 1 day early if closed on refill date. Avoid benzodiazepines within 8 hours of opioids   HYDROcodone-acetaminophen (NORCO) 10-325 MG tablet    Sig: Take 1 tablet by mouth every 8 (eight) hours as needed for severe pain. Must last 30 days.    Dispense:  60 tablet    Refill:  0    Chronic Pain: STOP Act (Not applicable) Fill 1 day early if closed on refill date. Avoid benzodiazepines within 8 hours of opioids   Continue gabapentin as prescribed.  Follow-up plan:   Return for Keep sch. appt.    Recent Visits Date Type Provider Dept  12/22/20 Procedure visit Gillis Santa, MD [facility]  12/15/20 Procedure visit Gillis Santa, MD [facility]  11/04/20 Office Visit Gillis Santa, MD [facility]  Showing recent visits within past 90 days and meeting all other requirements Today's Visits Date Type Provider Dept  01/20/21 Telemedicine Gillis Santa, MD [facility]  Showing today's visits and meeting all other requirements Future Appointments Date Type Provider Dept  03/17/21 Appointment Gillis Santa, MD [facility]  Showing future appointments within next 90 days and meeting all other requirements I discussed the assessment and treatment plan with the patient. The patient was provided an opportunity to ask questions and all were answered. The patient agreed with the  plan and demonstrated an understanding of the instructions.  Patient advised to call back or seek an in-person evaluation if the symptoms or condition worsens.  Duration of  encounter: 31mnutes.  Note by: BGillis Santa MD Date: 01/20/2021; Time: 2:00 PM

## 2021-02-01 ENCOUNTER — Other Ambulatory Visit: Payer: Self-pay | Admitting: Neurosurgery

## 2021-02-10 ENCOUNTER — Encounter
Admission: RE | Admit: 2021-02-10 | Discharge: 2021-02-10 | Disposition: A | Discharge status: Home / Self Care | Payer: Medicare HMO | Source: Ambulatory Visit | Attending: Neurosurgery | Admitting: Neurosurgery

## 2021-02-10 ENCOUNTER — Other Ambulatory Visit: Payer: Self-pay

## 2021-02-10 DIAGNOSIS — Z01818 Encounter for other preprocedural examination: Secondary | ICD-10-CM | POA: Insufficient documentation

## 2021-02-10 LAB — SURGICAL PCR SCREEN
MRSA, PCR: NEGATIVE
Staphylococcus aureus: NEGATIVE

## 2021-02-10 LAB — BASIC METABOLIC PANEL
Anion gap: 9 (ref 5–15)
BUN: 12 mg/dL (ref 8–23)
CO2: 25 mmol/L (ref 22–32)
Calcium: 9.6 mg/dL (ref 8.9–10.3)
Chloride: 105 mmol/L (ref 98–111)
Creatinine, Ser: 0.72 mg/dL (ref 0.61–1.24)
GFR, Estimated: >60 mL/min (ref >60)
Glucose, Bld: 100 mg/dL — ABNORMAL HIGH (ref 70–99)
Potassium: 3.5 mmol/L (ref 3.5–5.1)
Sodium: 139 mmol/L (ref 135–145)

## 2021-02-10 LAB — CBC
HCT: 42.6 % (ref 39.0–52.0)
Hemoglobin: 14.5 g/dL (ref 13.0–17.0)
MCH: 28.7 pg (ref 26.0–34.0)
MCHC: 34 g/dL (ref 30.0–36.0)
MCV: 84.2 fL (ref 80.0–100.0)
Platelets: 226 10*3/uL (ref 150–400)
RBC: 5.06 MIL/uL (ref 4.22–5.81)
RDW: 13.9 % (ref 11.5–15.5)
WBC: 5.8 10*3/uL (ref 4.0–10.5)
nRBC: 0 % (ref 0.0–0.2)

## 2021-02-10 LAB — APTT: aPTT: 30 seconds (ref 24–36)

## 2021-02-10 LAB — TYPE AND SCREEN
ABO/RH(D): O POS
Antibody Screen: NEGATIVE

## 2021-02-10 LAB — PROTIME-INR
INR: 1 (ref 0.8–1.2)
Prothrombin Time: 12.9 seconds (ref 11.4–15.2)

## 2021-02-10 NOTE — Patient Instructions (Addendum)
Your procedure is scheduled on:02/21/2021 Report to the Registration Desk on the 1st floor of the [facility].  To find out your arrival time, please call [phone] between 1PM - 3PM on: 02/20/2021  REMEMBER: Instructions that are not followed completely may result in serious medical risk, up to and including death; or upon the discretion of your surgeon and anesthesiologist your surgery may need to be rescheduled.  Do not eat food after midnight the night before surgery.  No gum chewing, lozengers or hard candies.  You may however, drink CLEAR liquids up to 2 hours before you are scheduled to arrive for your surgery. Do not drink anything within 2 hours of your scheduled arrival time.  Clear liquids include: - water  - apple juice without pulp - gatorade (not RED, PURPLE, OR BLUE) - black coffee or tea (Do NOT add milk or creamers to the coffee or tea) Do NOT drink anything that is not on this list.   Take this medications on day of surgery with sips of water only.  Gabapentin Norco Loratadine Flexeril     One week prior to surgery: Stop Anti-inflammatories (NSAIDS) such as Advil, Aleve, Ibuprofen, Motrin, Naproxen, Naprosyn and Aspirin based products such as Excedrin, Goodys Powder, BC Powder. Stop ANY OVER THE COUNTER supplements until after surger like biotin, fiber caps, multivitamins, You may however, continue to take Tylenol if needed for pain up until the day of surgery.  No Alcohol for 24 hours before or after surgery.  No Smoking including e-cigarettes for 24 hours prior to surgery.  No chewable tobacco products for at least 6 hours prior to surgery.  No nicotine patches on the day of surgery.  Do not use any "recreational" drugs for at least a week prior to your surgery.  Please be advised that the combination of cocaine and anesthesia may have negative outcomes, up to and including death. If you test positive for cocaine, your surgery will be cancelled.  On  the morning of surgery brush your teeth with toothpaste and water, you may rinse your mouth with mouthwash if you wish. Do not swallow any toothpaste or mouthwash.  Do not wear jewelry,  Do not wear lotions, powders, or perfumes.   Do not shave body from the neck down 48 hours prior to surgery just in case you cut yourself which could leave a site for infection.  Also, freshly shaved skin may become irritated if using the CHG soap.  Contact lenses, hearing aids and dentures may not be worn into surgery.  Do not bring valuables to the hospital. [facility] is not responsible for any missing/lost belongings or valuables.   Use CHG Soap or wipes as directed on instruction sheet.   Notify your doctor if there is any change in your medical condition (cold, fever, infection).  Wear comfortable clothing (specific to your surgery type) to the hospital.  After surgery, you can help prevent lung complications by doing breathing exercises.  Take deep breaths and cough every 1-2 hours. Your doctor may order a device called an Incentive Spirometer to help you take deep breaths.  If you are being admitted to the hospital overnight, leave your suitcase in the car. After surgery it may be brought to your room.  If you are being discharged the day of surgery, you will not be allowed to drive home. You will need a responsible adult (18 years or older) to drive you home and stay with you that night.   If you  are taking public transportation, you will need to have a responsible adult (18 years or older) with you. Please confirm with your physician that it is acceptable to use public transportation.   Please call the [facility] at [phone] if you have any questions about these instructions.  Surgery Visitation Policy:  Patients undergoing a surgery or procedure may have one family member or support person with them as long as that person is not COVID-19 positive or  experiencing its symptoms.  That person may remain in the waiting area during the procedure.  Inpatient Visitation:    Visiting hours are 7 a.m. to 8 p.m. Inpatients will be allowed two visitors daily. The visitors may change each day during the patient's stay. No visitors under the age of 27. Any visitor under the age of 28 must be accompanied by an adult. The visitor must pass COVID-19 screenings, use hand sanitizer when entering and exiting the patient's room and wear a mask at all times, including in the patient's room. Patients must also wear a mask when staff or their visitor are in the room. Masking is required regardless of vaccination status.

## 2021-02-11 ENCOUNTER — Other Ambulatory Visit: Payer: Self-pay | Admitting: Urgent Care

## 2021-02-11 DIAGNOSIS — Z01818 Encounter for other preprocedural examination: Secondary | ICD-10-CM

## 2021-02-11 DIAGNOSIS — R9431 Abnormal electrocardiogram [ECG] [EKG]: Secondary | ICD-10-CM

## 2021-02-11 NOTE — Progress Notes (Signed)
  Perioperative Services Pre-Admission/Anesthesia Testing    Date: 02/11/21  Name: Perry Jones MRN:   [id number]  Re: ECG changes and need for pre-operative clearance  Clinical Notes:  Patient is scheduled to undergo [place] on 02/21/2021 with Dr. [name], MD. In preparation for his procedure, patient presented to the PAT clinic for preoperative labs and ECG. In review of his ECG, there are changes noted concerning for an age undetermined inferior-posterior infarction. Patient does not have a prior history of MI in the past. ECG tracing confirmed by cardiology. Unfortunately, I did not speak with the patient while in the PAT office. However, in speaking with the PAT staff (nurse and lab), the patient appeared well and did not report any angina/anginal equivalent symptoms during his visit.   Patient contacted to make him aware of ECG changes and need for cardiology evaluation. Message sent through [facility] outlining concerns and plan for further evaluation. Discussed symptoms that would necessitate prompt attention in the ED. Advised patient to contact me over the weekend via MyChart should he have any questions regarding the information and plan.  Impression and Plan:  Perry Jones noted to have an abnormal ECG during PAT appointment. Tracing concerning for inferior-posterior infarct. Given patient's upcoming surgery, he will need to be seen in consult by cardiology for evaluation of this finding. No changes being made to the surgical schedule at this time. We may have to postpone his case based on appointment availability and the need for any testing deemed to be necessary by cardiology. Referral has been entered to [facility] for next available appointment; notation added regarding upcoming surgery date. Will plan on following up on Monday to determine appointment details. Additionally, will follow up  with surgeon's office to communicate finding and need for preoperative evaluation and clearance.   Orders Placed This Encounter  Procedures   Ambulatory referral to Cardiology    Referral Priority:   Routine    Referral Type:   Consultation    Referral Reason:   Specialty Services Required    Requested Specialty:   Cardiology    Number of Visits Requested:   [place], MSN, APRN, FNP-C, [facility]  Peri-operative Services Nurse Practitioner Phone: [phone] 02/11/21 10:01 PM  NOTE: This note has been prepared using Dragon dictation software. Despite my best ability to proofread, there is always the potential that unintentional transcriptional errors may still occur from this process.

## 2021-02-16 ENCOUNTER — Ambulatory Visit: Payer: Medicare HMO | Admitting: Internal Medicine

## 2021-02-16 ENCOUNTER — Encounter: Payer: Self-pay | Admitting: Internal Medicine

## 2021-02-16 ENCOUNTER — Encounter: Payer: Self-pay | Admitting: Neurosurgery

## 2021-02-16 ENCOUNTER — Other Ambulatory Visit: Payer: Self-pay

## 2021-02-16 VITALS — BP 126/82 | HR 76 | Ht 72.0 in | Wt 231.5 lb

## 2021-02-16 DIAGNOSIS — Z01810 Encounter for preprocedural cardiovascular examination: Secondary | ICD-10-CM | POA: Diagnosis not present

## 2021-02-16 DIAGNOSIS — R9431 Abnormal electrocardiogram [ECG] [EKG]: Secondary | ICD-10-CM

## 2021-02-16 NOTE — Patient Instructions (Signed)
Medication Instructions:   Your physician recommends that you continue on your current medications as directed. Please refer to the Current Medication list given to you today.  *If you need a refill on your cardiac medications before your next appointment, please call your pharmacy*   Lab Work:  None ordered  Testing/Procedures:  None ordered   Follow-Up: At [facility], you and your health needs are our priority.  As part of our continuing mission to provide you with exceptional heart care, we have created designated Provider Care Teams.  These Care Teams include your primary Cardiologist (physician) and Advanced Practice Providers (APPs -  Physician Assistants and Nurse Practitioners) who all work together to provide you with the care you need, when you need it.  We recommend signing up for the patient portal called "MyChart".  Sign up information is provided on this After Visit Summary.  MyChart is used to connect with patients for Virtual Visits (Telemedicine).  Patients are able to view lab/test results, encounter notes, upcoming appointments, etc.  Non-urgent messages can be sent to your provider as well.   To learn more about what you can do with MyChart, go to [email or web address].    Your next appointment:    Follow up as needed

## 2021-02-16 NOTE — Progress Notes (Signed)
Perioperative Services Pre-Admission/Anesthesia Testing    Date: 02/16/21  Name: Perry Jones MRN:   [id number]  Re: Cardiac clearance  Planned Surgical Procedure(s):    Case: [id number] Date/Time: 02/21/21 0700   Procedure: THORACIC SPINAL CORD STIMULATOR PERCUTANEOUS INSERTION & PULSE GENERATOR PLACEMENT - anesthesia: local w/ IV pain meds   Anesthesia type: Local   Pre-op diagnosis: g89.4 chronic pain syndrome   Location: [facility] OR ROOM 03 / [facility]   Surgeons: Perry Perla, MD   Clinical Notes:  Patient is scheduled for the above procedure on 02/21/2021 with Dr. [name], MD.  In preparation for his procedure, patient presented to the PAT clinic for preoperative lab testing and EKG.  EKG revealed sinus rhythm with evidence of an age undetermined inferior/posterior infarction.  Patient has no history of cardiovascular disease.  He was referred to cardiology for further evaluation and preoperative clearance.  Patient was seen in consult on 02/16/2021 by Dr. Nelva Bush, MD; notes reviewed.  Patient reported to be doing well overall from a cardiovascular perspective.  Patient denied any chest pain, shortness breath, PND, orthopnea, palpitations, significant peripheral edema, vertiginous symptoms, or presyncope/syncope.  Patient again denied any cardiovascular diagnoses; no previous cardiovascular testing in the past.  Functional capacity limited by chronic back pain and RIGHT knee pain.  Blood pressure in the office well controlled at 126/82 on currently prescribed ACEi and diuretic therapies.  Patient is not on any type of lipid-lowering therapies for ASCVD prevention.  Patient not diabetic.  ECG repeated in the office revealing NSR with early R wave transition and nonspecific ST abnormality. No changes were made to patient's medication regimen.  Patient to follow-up with outpatient cardiology on an as-needed basis.  Regarding appropriateness for upcoming  surgical procedure, cardiologist noted that patient has no ongoing cardiac symptoms thus he felt that it was reasonable to defer additional cardiovascular testing at this time.  Patient was advised to continue primary prevention of CAD under the direction of his PCP. Per cardiology, "patient does not have any signs or symptoms of unstable cardiac disease.  His ECG shows nonspecific changes which are stable when compared to previous tracings.  Though he was unable to complete 4 METS of activity due to chronic back and right knee pain, his planned spinal cord stimulator is LOW risk from a cardiac standpoint.  I do not believe that additional cardiac testing or intervention will further mitigate Perry Jones' perioperative risk.  He can proceed with his procedure as planned".  Impression and Plan:  Perry Jones noted to have questionable ECG changes during PAT testing.  He was referred to cardiology for further evaluation.  Patient seen in consult as per above.  Cardiologist deemed patient stable to proceed with planned surgical intervention with no further cardiovascular testing.  Clearance to proceed was issued at an overall LOW risk for significant perioperative cardiovascular complications. Of note, patient is hoping to completed upcoming procedure with location anesthesia with IV analgesia.   Based on clinical review performed today (02/16/21), barring any significant acute changes in the patient's overall condition, it is anticipated that he will be able to proceed with the planned surgical intervention. Any acute changes in clinical condition may necessitate his procedure being postponed and/or cancelled. Patient will meet with anesthesia team (MD and/or CRNA) on this day of his procedure for preoperative evaluation/assessment.   Pre-surgical instructions were reviewed with the patient during his PAT appointment and questions were fielded by PAT clinical staff. Patient  was advised that if any questions  or concerns arise prior to his procedure then he should return a call to PAT and/or his surgeon's office to discuss.  Perry Loh, MSN, APRN, FNP-C, CEN [facility]  Peri-operative Services Nurse Practitioner Phone: [phone] 02/16/21 1:18 PM  NOTE: This note has been prepared using Dragon dictation software. Despite my best ability to proofread, there is always the potential that unintentional transcriptional errors may still occur from this process.

## 2021-02-16 NOTE — Progress Notes (Signed)
New Outpatient Visit Date: 02/16/2021  Referring Provider: Karen Kitchens, NP [facility] [place],  [place] [address]  Chief Complaint: Abnormal preop EKG  HPI:  Perry Jones is a 70 y.o. male who is being seen today for the evaluation of abnormal EKG at the request of Perry Jones. He has a history of hypertension, hyperlipidemia, and back pain.  He is scheduled for spinal cord stimulator placement in 5 days by Dr. Lacinda Axon.  Preoperative EKG last week showed normal sinus rhythm with incomplete right bundle branch block and concern for inferior-posterior infarct.  Today, Perry Jones reports that he is feeling fairly well other than his chronic back and right knee pain.  His mobility is limited on account of this.  He is unable to climb a flight of stairs or walk extended distances.  He denies chest pain, shortness of breath, palpitations, lightheadedness, and edema.  He denies prior surgical complications.  He has not undergone cardiac testing in the past.  He is hoping to complete his spinal cord stimulator placement with local anesthesia only.  --------------------------------------------------------------------------------------------------  Cardiovascular History & Procedures: Cardiovascular Problems: Abnormal EKG  Risk Factors: Hypertension, hyperlipidemia, male gender, age greater than 39, obesity, and prior tobacco use  Cath/PCI: None  CV Surgery: None  EP Procedures and Devices: None  Non-Invasive Evaluation(s): None  Recent CV Pertinent Labs: Lab Results  Component Value Date   INR 1.0 02/10/2021   K 3.5 02/10/2021   BUN 12 02/10/2021   CREATININE 0.72 02/10/2021    --------------------------------------------------------------------------------------------------  Past Medical History:  Diagnosis Date   Dysplastic nevus 03/11/2020   upper back spine - DYSPLASTIC COMPOUND NEVUS WITH MODERATE ATYPIA, CLOSE TO MARGIN   Dysplastic nevus 03/12/2019   left mid back  4.0cm lat to spine - DYSPLASTIC COMPOUND NEVUS WITH MILD ATYPIA, DEEP MARGIN INVOLVED   High cholesterol    Hypertension    Sinus trouble     Past Surgical History:  Procedure Laterality Date   BACK SURGERY     basal cell removal     CHOLECYSTECTOMY     COLONOSCOPY WITH PROPOFOL N/A 11/22/2017   Procedure: COLONOSCOPY WITH PROPOFOL;  Surgeon: Lollie Sails, MD;  Location: [facility] ENDOSCOPY;  Service: Endoscopy;  Laterality: N/A;   JOINT REPLACEMENT Right    2007   KNEE ARTHROSCOPY     right knee   L1-L4 fuzed     2000   right knee replaced  2007    Current Meds  Medication Sig   benazepril-hydrochlorthiazide (LOTENSIN HCT) 20-12.5 MG tablet Take 1 tablet by mouth in the morning and at bedtime.   Biotin 10000 MCG TABS Take 10,000 mcg by mouth daily.   Calcium Polycarbophil (FIBER-CAPS PO) Take 1 capsule by mouth in the morning, at noon, and at bedtime.   cyclobenzaprine (FLEXERIL) 10 MG tablet Take 10 mg by mouth 3 (three) times daily as needed for muscle spasms.   diclofenac Sodium (VOLTAREN) 1 % GEL Apply 2 g topically daily as needed (pain).   docusate sodium (COLACE) 100 MG capsule Take 100 mg by mouth daily.   gabapentin (NEURONTIN) 300 MG capsule Take 300 mg by mouth 5 (five) times daily.   HYDROcodone-acetaminophen (NORCO) 10-325 MG tablet Take 1 tablet by mouth every 8 (eight) hours as needed for severe pain. Must last 30 days.   Loratadine 10 MG CAPS Take 10 mg by mouth daily.   Multiple Vitamin (MULTI-VITAMINS) TABS Take 1 tablet by mouth daily.   zolpidem (  AMBIEN) 10 MG tablet Take 1 tablet (10 mg total) by mouth at bedtime as needed. for sleep (Patient taking differently: Take 10 mg by mouth at bedtime. for sleep)    Allergies: Prednisone  Social History   Tobacco Use   Smoking status: Former    Packs/day: 2.00    Years: 20.00    Pack years: 40.00    Types: Cigarettes    Quit date: 01/24/1985    Years since quitting: 36.0   Smokeless tobacco: Never  Vaping  Use   Vaping Use: Never used  Substance Use Topics   Alcohol use: Yes    Comment: rare   Drug use: No    Family History  Problem Relation Age of Onset   Heart Problems Mother     Review of Systems: A 12-system review of systems was performed and was negative except as noted in the HPI.  --------------------------------------------------------------------------------------------------  Physical Exam: BP 126/82 (BP Location: Right Arm, Patient Position: Sitting, Cuff Size: Normal)   Pulse 76   Ht 6' (1.829 m)   Wt 231 lb 8 oz (105 kg)   SpO2 98%   BMI 31.40 kg/m   General: NAD. HEENT: No conjunctival pallor or scleral icterus. Facemask in place. Neck: Supple without lymphadenopathy, thyromegaly, JVD, or HJR. No carotid bruit. Lungs: Normal work of breathing. Clear to auscultation bilaterally without wheezes or crackles. Heart: Regular rate and rhythm without murmurs, rubs, or gallops. Non-displaced PMI. Abd: Bowel sounds present. Soft, NT/ND without hepatosplenomegaly Ext: No lower extremity edema. Radial, PT, and DP pulses are 2+ bilaterally Skin: Warm and dry without rash. Neuro: CNIII-XII intact. Strength and fine-touch sensation intact in upper and lower extremities bilaterally. Psych: Normal mood and affect.  EKG: Normal sinus rhythm with early R wave transition and nonspecific ST abnormality.  Compared with prior tracing from 02/10/2021, incomplete right bundle branch block is no longer present, likely due to differences in lead placement.  Lab Results  Component Value Date   WBC 5.8 02/10/2021   HGB 14.5 02/10/2021   HCT 42.6 02/10/2021   MCV 84.2 02/10/2021   PLT 226 02/10/2021    Lab Results  Component Value Date   NA 139 02/10/2021   K 3.5 02/10/2021   CL 105 02/10/2021   CO2 25 02/10/2021   BUN 12 02/10/2021   CREATININE 0.72 02/10/2021   GLUCOSE 100 (H) 02/10/2021   ALT 37 09/28/2020    --------------------------------------------------------------------------------------------------  ASSESSMENT AND PLAN: Abnormal EKG: Tracing today shows early R wave transition that has been present back to 2007.  Subtle nonspecific ST abnormality is also present and similar to prior tracings.  Though functional capacity is limited, Perry Jones does not have any ongoing cardiac symptoms.  I think it is reasonable to defer additional testing at this time.  He should continue primary prevention of coronary artery disease under the direction of his PCP.  Preop cardiovascular risk assessment: Perry Jones does not have any signs or symptoms of unstable cardiac disease.  His EKG shows nonspecific changes which are stable compared with prior tracings.  Though he is unable to complete 4 METS of activity due to chronic back and right knee pain, his planned spinal cord stimulator implantation is low risk from a cardiac standpoint.  I do not believe that additional cardiac testing or intervention will further mitigate Perry Jones' perioperative risk.  He can proceed with his procedure as planned.  Follow-up: Return to clinic as needed.  Nelva Bush, MD 02/16/2021 10:43 AM

## 2021-02-21 ENCOUNTER — Ambulatory Visit: Payer: Medicare HMO

## 2021-02-21 ENCOUNTER — Encounter
Admission: RE | Disposition: A | Discharge status: Home / Self Care | Payer: Self-pay | Source: Home / Self Care | Attending: Neurosurgery

## 2021-02-21 ENCOUNTER — Ambulatory Visit
Admission: RE | Admit: 2021-02-21 | Discharge: 2021-02-21 | Disposition: A | Discharge status: Home / Self Care | Payer: Medicare HMO | Attending: Neurosurgery | Admitting: Neurosurgery

## 2021-02-21 ENCOUNTER — Ambulatory Visit: Payer: Medicare HMO | Admitting: Urgent Care

## 2021-02-21 ENCOUNTER — Encounter: Payer: Self-pay | Admitting: Neurosurgery

## 2021-02-21 ENCOUNTER — Other Ambulatory Visit: Payer: Self-pay

## 2021-02-21 DIAGNOSIS — Z79899 Other long term (current) drug therapy: Secondary | ICD-10-CM | POA: Diagnosis not present

## 2021-02-21 DIAGNOSIS — G629 Polyneuropathy, unspecified: Secondary | ICD-10-CM | POA: Insufficient documentation

## 2021-02-21 DIAGNOSIS — G894 Chronic pain syndrome: Secondary | ICD-10-CM | POA: Insufficient documentation

## 2021-02-21 DIAGNOSIS — Z87891 Personal history of nicotine dependence: Secondary | ICD-10-CM | POA: Insufficient documentation

## 2021-02-21 DIAGNOSIS — Z419 Encounter for procedure for purposes other than remedying health state, unspecified: Secondary | ICD-10-CM

## 2021-02-21 DIAGNOSIS — Z85828 Personal history of other malignant neoplasm of skin: Secondary | ICD-10-CM | POA: Diagnosis not present

## 2021-02-21 DIAGNOSIS — Z96651 Presence of right artificial knee joint: Secondary | ICD-10-CM | POA: Insufficient documentation

## 2021-02-21 DIAGNOSIS — Z888 Allergy status to other drugs, medicaments and biological substances status: Secondary | ICD-10-CM | POA: Diagnosis not present

## 2021-02-21 DIAGNOSIS — Z981 Arthrodesis status: Secondary | ICD-10-CM | POA: Insufficient documentation

## 2021-02-21 HISTORY — PX: SPINAL CORD STIMULATOR IMPLANT: SHX2422

## 2021-02-21 HISTORY — PX: SPINAL CORD STIMULATOR INSERTION: SHX5378

## 2021-02-21 IMAGING — RF DG THORACIC SPINE 2V
1 series · 2 of 2 positions shown · non-contrast
Comparison: None.

CLINICAL DATA: Spinal stimulator placement

EXAM:
THORACIC SPINE 2 VIEWS

[Series 1: dg x-ray · 0.20mm/px · 2 of 2 slices shown]
[im 1/2]
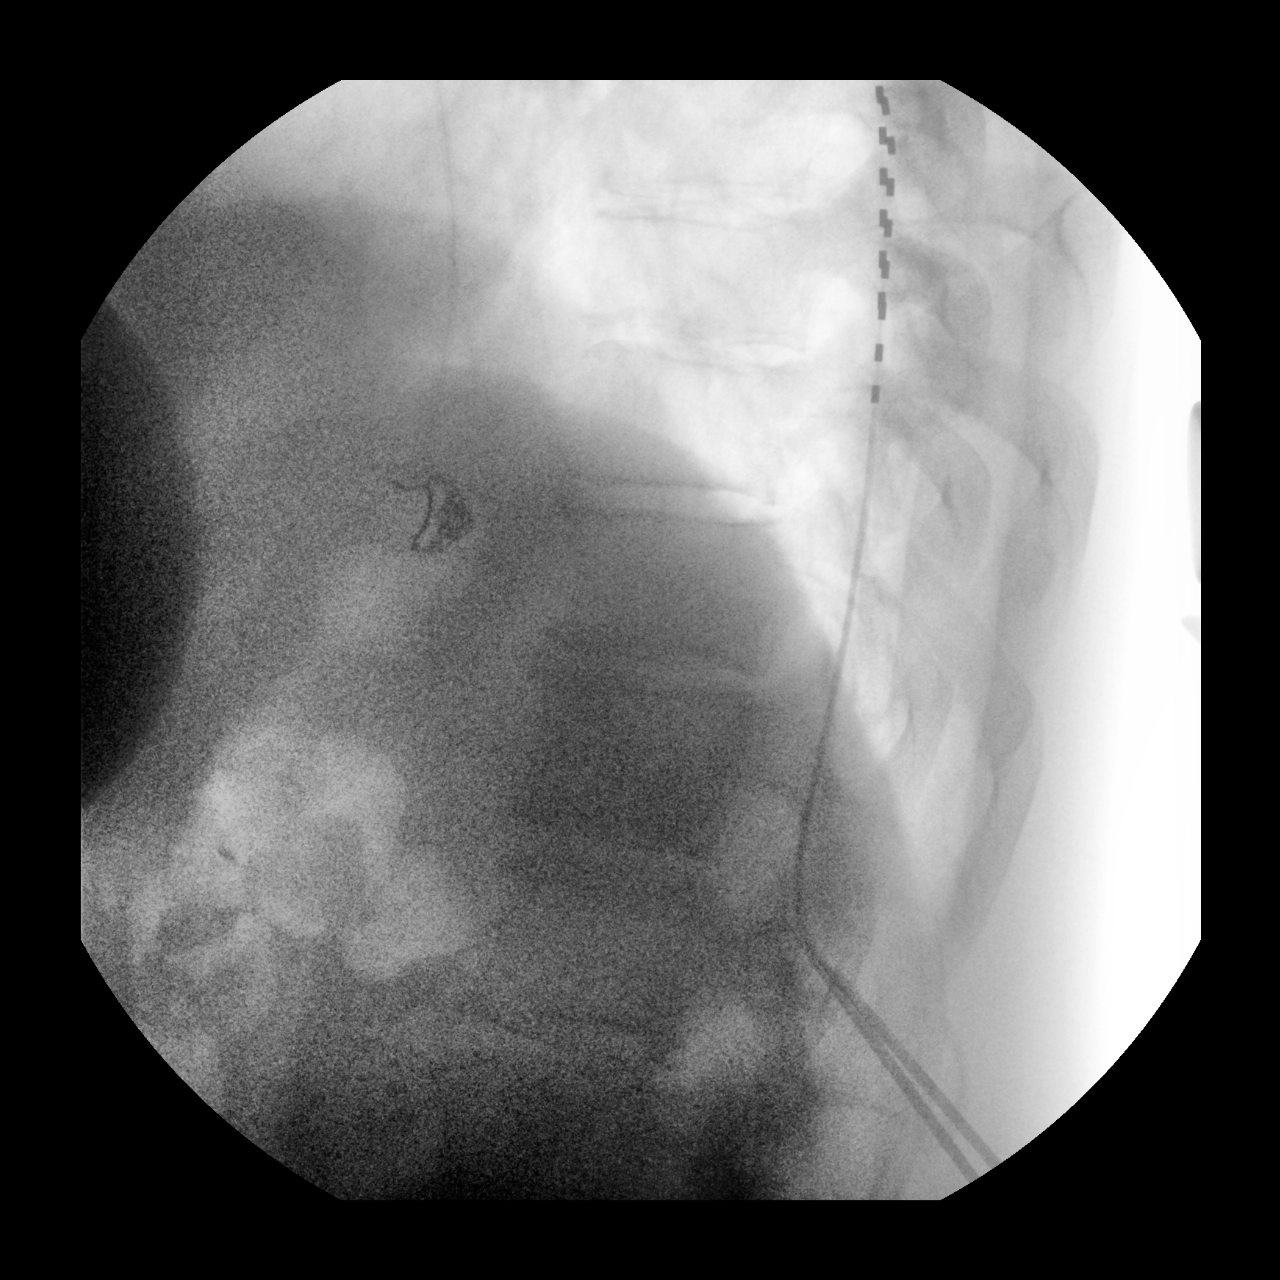
[im 2/2]
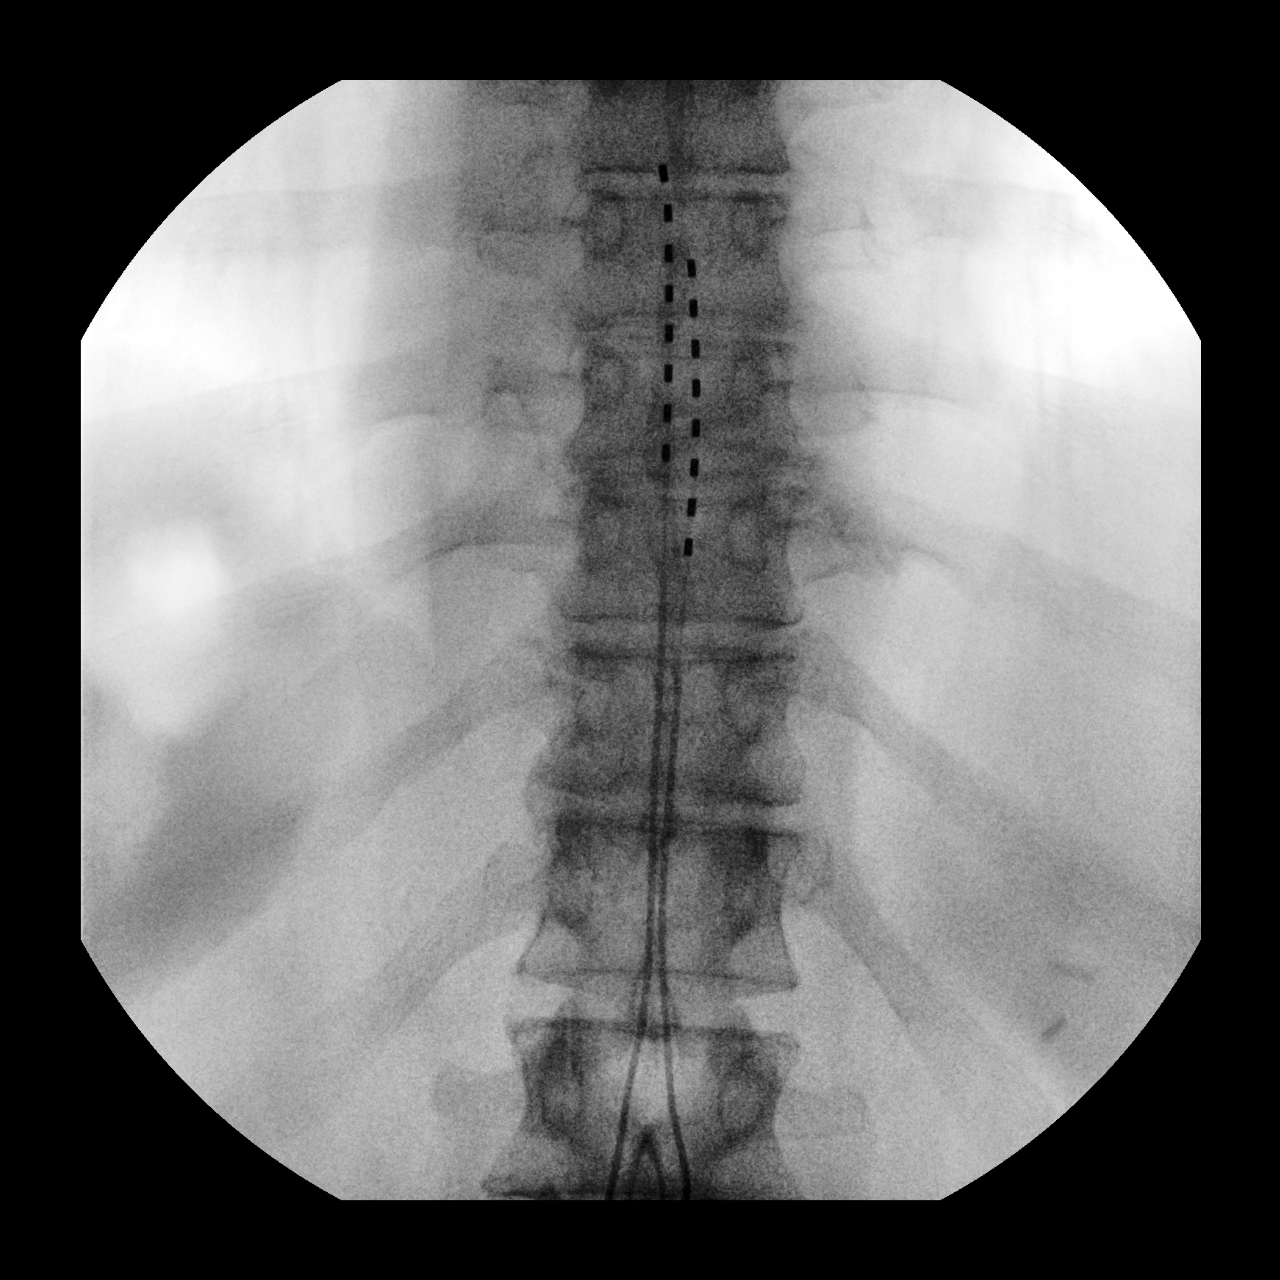

[2 of 2 positions shown; findings below may reference images not displayed]

FLUOROSCOPY TIME:  Radiation Exposure Index (as provided by the
fluoroscopic device): Not available

If the device does not provide the exposure index:

Fluoroscopy Time:  5 minutes 4 seconds

Number of Acquired Images:  2
FINDINGS: Spinal stimulator leads are noted spanning from T8-T10 levels. No
other focal abnormality is noted.
IMPRESSION: Spinal stimulator placement in the mid to lower thoracic spine.

## 2021-02-21 SURGERY — INSERTION, SPINAL CORD STIMULATOR, LUMBAR
Anesthesia: Monitor Anesthesia Care

## 2021-02-21 MED ORDER — GLYCOPYRROLATE 0.2 MG/ML IJ SOLN
INTRAMUSCULAR | Status: AC
  Filled 2021-02-21: qty 1

## 2021-02-21 MED ORDER — LIDOCAINE HCL (PF) 2 % IJ SOLN
INTRAMUSCULAR | Status: AC
  Filled 2021-02-21: qty 5

## 2021-02-21 MED ORDER — ONDANSETRON HCL 4 MG/2ML IJ SOLN
INTRAMUSCULAR | Status: AC
  Filled 2021-02-21: qty 2

## 2021-02-21 MED ORDER — PROPOFOL 10 MG/ML IV BOLUS
INTRAVENOUS | Status: AC
  Filled 2021-02-21: qty 20

## 2021-02-21 MED ORDER — CHLORHEXIDINE GLUCONATE 0.12 % MT SOLN
OROMUCOSAL | Status: AC
  Administered 2021-02-21: 15 mL via OROMUCOSAL
  Filled 2021-02-21: qty 15

## 2021-02-21 MED ORDER — ORAL CARE MOUTH RINSE: 15.0000 mL | Freq: Once | OROMUCOSAL | Status: AC

## 2021-02-21 MED ORDER — FENTANYL CITRATE (PF) 100 MCG/2ML IJ SOLN
INTRAMUSCULAR | Status: AC
  Filled 2021-02-21: qty 2

## 2021-02-21 MED ORDER — CHLORHEXIDINE GLUCONATE 0.12 % MT SOLN: 15.0000 mL | Freq: Once | OROMUCOSAL | Status: AC

## 2021-02-21 MED ORDER — MIDAZOLAM HCL 2 MG/2ML IJ SOLN
INTRAMUSCULAR | Status: AC
  Filled 2021-02-21: qty 2

## 2021-02-21 MED ORDER — DEXAMETHASONE SODIUM PHOSPHATE 10 MG/ML IJ SOLN
INTRAMUSCULAR | Status: AC
  Filled 2021-02-21: qty 1

## 2021-02-21 MED ORDER — PHENYLEPHRINE HCL (PRESSORS) 10 MG/ML IV SOLN
INTRAVENOUS | Status: AC
  Filled 2021-02-21: qty 1

## 2021-02-21 MED ORDER — KETOROLAC TROMETHAMINE 30 MG/ML IJ SOLN
INTRAMUSCULAR | Status: AC
  Filled 2021-02-21: qty 1

## 2021-02-21 MED ORDER — CEFAZOLIN SODIUM-DEXTROSE 2-4 GM/100ML-% IV SOLN
2.0000 g | INTRAVENOUS | Status: AC
  Administered 2021-02-21: 2 g via INTRAVENOUS

## 2021-02-21 MED ORDER — OXYCODONE HCL 5 MG PO TABS: 5.0000 mg | ORAL_TABLET | Freq: Three times a day (TID) | ORAL | 0 refills | Status: AC | PRN

## 2021-02-21 MED ORDER — VANCOMYCIN HCL 1000 MG IV SOLR
INTRAVENOUS | Status: DC | PRN
  Administered 2021-02-21: 1000 mg via TOPICAL

## 2021-02-21 MED ORDER — 0.9 % SODIUM CHLORIDE (POUR BTL) OPTIME
TOPICAL | Status: DC | PRN
  Administered 2021-02-21: 1000 mL

## 2021-02-21 MED ORDER — FAMOTIDINE 20 MG PO TABS
ORAL_TABLET | ORAL | Status: AC
  Administered 2021-02-21: 20 mg via ORAL
  Filled 2021-02-21: qty 1

## 2021-02-21 MED ORDER — SEVOFLURANE IN SOLN
RESPIRATORY_TRACT | Status: AC
  Filled 2021-02-21: qty 250

## 2021-02-21 MED ORDER — LACTATED RINGERS IV SOLN: INTRAVENOUS | Status: DC

## 2021-02-21 MED ORDER — KETOROLAC TROMETHAMINE 30 MG/ML IJ SOLN
INTRAMUSCULAR | Status: DC | PRN
  Administered 2021-02-21: 30 mg via INTRAVENOUS

## 2021-02-21 MED ORDER — FAMOTIDINE 20 MG PO TABS: 20.0000 mg | ORAL_TABLET | Freq: Once | ORAL | Status: AC

## 2021-02-21 MED ORDER — BUPIVACAINE-EPINEPHRINE (PF) 0.5% -1:200000 IJ SOLN
INTRAMUSCULAR | Status: DC | PRN
  Administered 2021-02-21: 71 mL

## 2021-02-21 MED ORDER — CEFAZOLIN SODIUM-DEXTROSE 2-4 GM/100ML-% IV SOLN
INTRAVENOUS | Status: AC
  Filled 2021-02-21: qty 100

## 2021-02-21 SURGICAL SUPPLY — 55 items
ADH SKN CLS APL DERMABOND .7 (GAUZE/BANDAGES/DRESSINGS) ×1
APL PRP STRL LF DISP 70% ISPRP (MISCELLANEOUS) ×2
CHLORAPREP W/TINT 26 (MISCELLANEOUS) ×3 IMPLANT
CNTNR SPEC 2.5X3XGRAD LEK (MISCELLANEOUS) ×1
CONT SPEC 4OZ STER OR WHT (MISCELLANEOUS) ×1
CONT SPEC 4OZ STRL OR WHT (MISCELLANEOUS) ×1
CONTAINER SPEC 2.5X3XGRAD LEK (MISCELLANEOUS) IMPLANT
COUNTER NEEDLE 20/40 LG (NEEDLE) ×2 IMPLANT
COVER LIGHT HANDLE STERIS (MISCELLANEOUS) ×4 IMPLANT
DERMABOND ADVANCED (GAUZE/BANDAGES/DRESSINGS) ×1
DERMABOND ADVANCED .7 DNX12 (GAUZE/BANDAGES/DRESSINGS) IMPLANT
DEVICE IMPLANT NEUROSTIMULATOR (Neuro Prosthesis/Implant) ×1 IMPLANT
DRAPE C-ARM XRAY 36X54 (DRAPES) ×4 IMPLANT
DRAPE C-ARMOR (DRAPES) IMPLANT
DRAPE INCISE IOBAN 66X45 STRL (DRAPES) ×1 IMPLANT
DRAPE LAPAROTOMY 100X77 ABD (DRAPES) ×2 IMPLANT
DRAPE SURG 17X11 SM STRL (DRAPES) ×2 IMPLANT
DRSG TEGADERM 4X4.75 (GAUZE/BANDAGES/DRESSINGS) ×2 IMPLANT
ELECT CAUTERY BLADE TIP 2.5 (TIP) ×2
ELECTRODE CAUTERY BLDE TIP 2.5 (TIP) ×1 IMPLANT
ENVELOPE ABSORB ANTIBACTERIAL (Mesh General) ×2 IMPLANT
GAUZE 4X4 16PLY ~~LOC~~+RFID DBL (SPONGE) ×2 IMPLANT
GAUZE SPONGE 4X4 12PLY STRL (GAUZE/BANDAGES/DRESSINGS) ×1 IMPLANT
GLOVE SRG 8 PF TXTR STRL LF DI (GLOVE) ×1 IMPLANT
GLOVE SURG SYN 6.5 ES PF (GLOVE) ×4 IMPLANT
GLOVE SURG SYN 6.5 PF PI (GLOVE) ×2 IMPLANT
GLOVE SURG SYN 8.0 (GLOVE) ×2 IMPLANT
GLOVE SURG SYN 8.0 PF PI (GLOVE) ×1 IMPLANT
GLOVE SURG UNDER POLY LF SZ6.5 (GLOVE) ×4 IMPLANT
GLOVE SURG UNDER POLY LF SZ8 (GLOVE) ×2
GOWN SRG LRG LVL 4 IMPRV REINF (GOWNS) ×2 IMPLANT
GOWN STRL REIN LRG LVL4 (GOWNS) ×4
GRADUATE 1200CC STRL 31836 (MISCELLANEOUS) ×2 IMPLANT
KIT LEAD (Lead) ×2 IMPLANT
KIT NEURO ACCESS (MISCELLANEOUS) IMPLANT
KIT TURNOVER KIT A (KITS) ×2 IMPLANT
MANIFOLD NEPTUNE II (INSTRUMENTS) ×2 IMPLANT
MARKER SKIN DUAL TIP RULER LAB (MISCELLANEOUS) ×4 IMPLANT
NEEDLE HYPO 22GX1.5 SAFETY (NEEDLE) ×2 IMPLANT
NEURO ACCES KIT (MISCELLANEOUS) ×4
NS IRRIG 1000ML POUR BTL (IV SOLUTION) ×2 IMPLANT
PACK LAMINECTOMY NEURO (CUSTOM PROCEDURE TRAY) ×2 IMPLANT
PAD ARMBOARD 7.5X6 YLW CONV (MISCELLANEOUS) ×2 IMPLANT
PENCIL ELECTRO HAND CTR (MISCELLANEOUS) ×1 IMPLANT
POUCH TYRX ANTIBAC NEURO MED (Mesh General) IMPLANT
RECHARGER INTELLIS (NEUROSURGERY SUPPLIES) ×1 IMPLANT
REPROGRAMMER INTELLIS (NEUROSURGERY SUPPLIES) ×1 IMPLANT
SPONGE T-LAP 4X18 ~~LOC~~+RFID (SPONGE) ×1 IMPLANT
SUT ETHILON 3-0 FS-10 30 BLK (SUTURE) ×4
SUT POLYSORB 2-0 5X18 GS-10 (SUTURE) ×4 IMPLANT
SUT SILK 2 0 SH (SUTURE) ×3 IMPLANT
SUTURE EHLN 3-0 FS-10 30 BLK (SUTURE) ×2 IMPLANT
SYR CONTROL 10ML LL (SYRINGE) ×1 IMPLANT
TOWEL OR 17X26 4PK STRL BLUE (TOWEL DISPOSABLE) ×4 IMPLANT
WATER STERILE IRR 500ML POUR (IV SOLUTION) ×2 IMPLANT

## 2021-02-21 NOTE — Progress Notes (Signed)
Dr. Lacinda Axon in to talk with patient and wife [place] about the procedure. All questions answered to satisfaction. Patient is stable and doing great.

## 2021-02-21 NOTE — Discharge Instructions (Signed)
NEUROSURGERY DISCHARGE INSTRUCTIONS  Admission diagnosis: g89.4 chronic pain syndrome  Operative procedure: Placement of thoracic spinal cord stimulator and pulse generator  What to do after you leave the hospital:  Recommended diet: regular diet. Increase protein intake to promote wound healing.  Recommended activity: no lifting, driving, or strenuous exercise for 6 weeks . No driving for 2 weeks.You should walk multiple times per day  Special Instructions  No straining, no heavy lifting > 10lbs x 4 weeks.  Keep incision area clean and dry. May shower in 2 days. No baths or pools for 6 weeks.   You have no sutures to remove, the skin is closed with adhesive. This will come off on its own in 10-14 days.  Please take pain medications as directed. Take a stool softener if on pain medications   Please Report any of the following: Nausea or Vomiting, Temperature is greater than 101.3F (38.1C) degrees, Dizziness, Abdominal Pain, Difficulty Breathing or Shortness of Breath, Inability to Eat, drink Fluids, or Take medications, Bleeding, swelling, or drainage from surgical incision sites, New numbness or weakness, and Bowel or bladder dysfunction to the neurosurgeon on call at [phone]  Additional Follow up appointments Please follow up with Cooper Render PA-C in [facility] clinic as scheduled in 2-3 weeks   Please see below for scheduled appointments:  Future Appointments  Date Time Provider Clarinda  03/17/2021  9:00 AM Ralene Bathe, MD [facility] None  03/17/2021  1:20 PM Gillis Santa, MD [facility] None

## 2021-02-21 NOTE — H&P (Signed)
Perry Jones is an 70 y.o. male.   Chief Complaint: Chronic pain HPI: Perry Jones is here for evaluation of ongoing pain in his back and legs, right greater than left. He states that he has had prior lumbar fusion and he noted that about a year and a half after that he started developing low back pain. He additionally has dealt with chronic right knee pain and underwent a knee replacement and this did help some but he still experiences some pain around the knee. He was having more pain in the legs and now more recently started noticing more numbness and tingling in the feet and has been diagnosed with neuropathy. He was being followed by the [place] pain clinic and underwent a spinal cord stimulator trial recently where he did note approximately 75% relief of the lower back pain and leg pain. He does note that he underwent a trial approximate 20 years ago but this did not help with the back pain and therefore he did not proceed. He was very pleased with the most recent trial and is looking for more permanent solution to help relieve him of the medication use and to get him back to more activity.  Past Medical History:  Diagnosis Date   Dysplastic nevus 03/11/2020   upper back spine - DYSPLASTIC COMPOUND NEVUS WITH MODERATE ATYPIA, CLOSE TO MARGIN   Dysplastic nevus 03/12/2019   left mid back 4.0cm lat to spine - DYSPLASTIC COMPOUND NEVUS WITH MILD ATYPIA, DEEP MARGIN INVOLVED   High cholesterol    Hypertension    Sinus trouble     Past Surgical History:  Procedure Laterality Date   CHOLECYSTECTOMY     COLONOSCOPY WITH PROPOFOL N/A 11/22/2017   Procedure: COLONOSCOPY WITH PROPOFOL;  Surgeon: Lollie Sails, MD;  Location: [facility] ENDOSCOPY;  Service: Endoscopy;  Laterality: N/A;   KNEE ARTHROSCOPY Right    LUMBAR FUSION N/A 2000   L1-L4   REPLACEMENT TOTAL KNEE Right 2007   SKIN CANCER EXCISION     Bx (+) BSC    Family History  Problem Relation Age of Onset   Heart Problems Mother         arrhythmia   Dementia Mother    Lung cancer Father    Social History:  reports that he quit smoking about 36 years ago. His smoking use included cigarettes. He has a 36.00 pack-year smoking history. He has never used smokeless tobacco. He reports current alcohol use. He reports that he does not use drugs.  Allergies:  Allergies  Allergen Reactions   Prednisone Other (See Comments)    BLURRED VISION and HA with ALL steroids     Medications Prior to Admission  Medication Sig Dispense Refill   benazepril-hydrochlorthiazide (LOTENSIN HCT) 20-12.5 MG tablet Take 1 tablet by mouth in the morning and at bedtime.     Biotin 10000 MCG TABS Take 10,000 mcg by mouth daily.     Calcium Polycarbophil (FIBER-CAPS PO) Take 1 capsule by mouth in the morning, at noon, and at bedtime.     cyclobenzaprine (FLEXERIL) 10 MG tablet Take 10 mg by mouth 3 (three) times daily as needed for muscle spasms.     diclofenac Sodium (VOLTAREN) 1 % GEL Apply 2 g topically daily as needed (pain).     docusate sodium (COLACE) 100 MG capsule Take 100 mg by mouth daily.     gabapentin (NEURONTIN) 300 MG capsule Take 300 mg by mouth 5 (five) times daily.     HYDROcodone-acetaminophen (  NORCO) 10-325 MG tablet Take 1 tablet by mouth every 8 (eight) hours as needed for severe pain. Must last 30 days. 60 tablet 0   Loratadine 10 MG CAPS Take 10 mg by mouth daily.     Multiple Vitamin (MULTI-VITAMINS) TABS Take 1 tablet by mouth daily.     zolpidem (AMBIEN) 10 MG tablet Take 1 tablet (10 mg total) by mouth at bedtime as needed. for sleep (Patient taking differently: Take 10 mg by mouth at bedtime. for sleep) 90 tablet 0   HYDROcodone-acetaminophen (NORCO) 10-325 MG tablet Take 1 tablet by mouth every 8 (eight) hours as needed for severe pain. Must last 30 days. (Patient not taking: Reported on 02/16/2021) 60 tablet 0    No results found for this or any previous visit (from the past 48 hour(s)). No results found.  Review  of Systems General ROS: Negative Psychological ROS: Negative Ophthalmic ROS: Negative ENT ROS: Negative Hematological and Lymphatic ROS: Negative  Endocrine ROS: Negative Respiratory ROS: Negative Cardiovascular ROS: Negative Gastrointestinal ROS: Negative Genito-Urinary ROS: Negative Musculoskeletal ROS: Positive for back pain Neurological ROS: Positive for leg pain, numbness Dermatological ROS: Negative There were no vitals taken for this visit. Physical Exam  General appearance: Alert, cooperative, in no acute distress Head: Normocephalic, atraumatic Eyes: Normal, EOM intact Oropharynx: Wearing facemask Back: Well-healed midline incision Ext: No edema in LE bilaterally, healed incision over right knee  Neurologic exam:  Mental status: alertness: alert, affect: normal Speech: fluent and clear Motor:strength symmetric 5/5 in bilateral lower extremities throughout all motor groups Sensory: Some decrease noted in the right toes, tingling sensations in the left ankle Gait: normal   MRI thoracic spine: There is a normal kyphotic curvature. There is stable alignment. There is no obvious central stenosis.  MRI lumbar spine: There is evidence of a prior L3-S1 fusion. There is some adjacent level stenosis at L2-3 which is mild in nature. There is a normal lordotic curvature. The alignment above the fusion remains well-maintained. Assessment/Plan Proceed with thoracic SCS  [name], MD 02/21/2021, 6:39 AM

## 2021-02-21 NOTE — Interval H&P Note (Signed)
History and Physical Interval Note:  02/21/2021 6:40 AM  Perry Jones  has presented today for surgery, with the diagnosis of g89.4 chronic pain syndrome.  The various methods of treatment have been discussed with the patient and family. After consideration of risks, benefits and other options for treatment, the patient has consented to  Procedure(s) with comments: [place] (N/A) - anesthesia: local w/ IV pain meds as a surgical intervention.  The patient's history has been reviewed, patient examined, no change in status, stable for surgery.  I have reviewed the patient's chart and labs.  Questions were answered to the patient's satisfaction.     [name]

## 2021-02-21 NOTE — Op Note (Signed)
Operative Note   SURGERY DATE:  02/21/2021   PRE-OP DIAGNOSIS:  Chronic pain syndrome   POST-OP DIAGNOSIS: Post-Op Diagnosis Codes: Chronic pain syndrome   Procedure(s) with comments: Percutaneous thoracic spinal cord stimulator lead placement Right flank pulse generator placement   SURGEON:    * Malen Gauze, MD   Cooper Render, PA - Asst   ANESTHESIA: General    OPERATIVE FINDINGS: Successful placement of thoracic spinal cord stimulator and pulse generator   Indication Perry Jones was seen in clinic on 8/9 with ongoing back and leg pain not relieved with medical management. He was referred for a spinal cord stimulator trial and did well with greater than 75% relief of pain in her back and proximal legs. The patient wished to proceed to permanent implant to achieve better pain control. Risks including damage to spinal cord, weakness, hematoma, infection, failure of pain relief, post-operative pain, need for revision, stroke, heart attack, pneumonia, and spinal cord injury were discussed.     Procedure The patient was brought to the operating room where vascular access was obtained. He was placed prone on gel rolls. Antibiotics were given. Fluoroscopy was used to confirm planned incision in lumbar area at the level of L2-3 in the midline.  An additional incision was planned in the right flank for the pulse generator placement.  The patient was prepped and draped in a sterile fashion. A hard time out was performed. Local anesthetic was instilled into planned incision sites.   The midline lumbar incision was opened and taken to the fascia using cautery.Next, a Touhy needle was used to insert in a paramedian approach to enter the interlaminar space between L2 and L1.  Once loss of resistance was identified, this was confirmed with a metal stylette.  Next, the percutaneous lead was passed in the rostral direction using fluoroscopy as guidance to keep in the midline.  This was passed without  resistance to the level of the T7 vertebral body on the left.  Lateral views were obtained to confirm we were in the dorsal epidural space.  Next, the same procedure was performed contralaterally. We placed an additional lead on the right at T8, slightly offset and inferior to the previous lead to allow for better coverage. The leads were secured with anchors in the fascia.  The incision was irrigated and hemostasis obtained.    Next, the right flank incision was opened and taken to the fascia and inferior dissection to allow for a large enough pocket for the placement of the battery.  The incision was irrigated and hemostasis obtained.  Next, a tunneler was used to pass the electrode leads from the lumbar incision to the right flank.  There, the electrodes were attached to the pulse generator and impedances were found to be normal. We stimulated through the battery and the patient stated he was getting adequate relief. The pulse generator was placed into the incision taking care to place the wires beneath the pulse generator.    A final fluoroscopic image was taken show good placement of percutaneous leads.  The battery and wires were secured to fascia with suture. Vancomycin powder was placed into the incisions.  Next,  2-0  Vicryls were used to close the incisions with Dermabond on the skin in flank and lumbar midline.   The patient was returned to supine position and the patient was seen to be moving all extremities symmetrically and was taken to PACU for recovery.      ESTIMATED BLOOD LOSS:  10 cc   SPECIMENS None   IMPLANT KIT LEAD - [id number]  Inventory Item: KIT LEAD Serial no.:  Model/Cat no.: [id number]  Implant name: KIT LEAD - [id number] Laterality: N/A Area: Spine Thoracic  Manufacturer: [employer] Date of Manufacture:    Action: Implanted Number Used: 1   Device Identifier:  Device Identifier Type:     KIT LEAD - [id number]  Inventory Item: KIT LEAD Serial no.:  Model/Cat  no.: [id number]  Implant name: KIT LEAD - [id number] Laterality: N/A Area: Spine Thoracic  Manufacturer: [employer] Date of Manufacture:    Action: Implanted Number Used: 1   Device Identifier:  Device Identifier Type:     DEVICE Lucillie Garfinkel [id number]  Inventory Item: DEVICE IMPLANT NEUROSTIMINATOR Serial no.: [id number] Model/Cat no.: [id number]  Implant name: DEVICE Lucillie Garfinkel [id number] Laterality: N/A Area: Spine Thoracic  Manufacturer: [employer] NEUROMOD PAIN MGMT Date of Manufacture:    Action: Implanted Number Used: 1   Device Identifier:  Device Identifier Type:     ENVELOPE ABSORB ANTIBACTERIAL - [id number]  Inventory Item: ENVELOPE ABSORB ANTIBACTERIAL Serial no.:  Model/Cat no.: [id number]  Implant name: ENVELOPE ABSORB ANTIBACTERIAL - [id number] Laterality: N/A Area: Spine Thoracic  Manufacturer: [employer] Date of Manufacture:    Action: Implanted Number Used: 1   Device Identifier:  Device Identifier Type:         I performed the case in its entirety with assistance of Jules Baty, [place]   [name], [place]

## 2021-02-21 NOTE — Anesthesia Postprocedure Evaluation (Signed)
Anesthesia Post Note  Patient: Perry Jones  Procedure(s) Performed: THORACIC SPINAL Tobias PLACEMENT  Patient location during evaluation: PACU Anesthesia Type: MAC Level of consciousness: awake and alert Pain management: pain level controlled Vital Signs Assessment: post-procedure vital signs reviewed and stable Respiratory status: spontaneous breathing, nonlabored ventilation, respiratory function stable and patient connected to nasal cannula oxygen Cardiovascular status: blood pressure returned to baseline and stable Postop Assessment: no apparent nausea or vomiting Anesthetic complications: no   No notable events documented.   Last Vitals:  Vitals:   02/21/21 0701 02/21/21 0914  BP: 129/88 (!) 160/82  Pulse: 75 85  Resp: 16 17  Temp: 37 C 36.7 C  SpO2: 100% 99%    Last Pain:  Vitals:   02/21/21 0914  TempSrc: Temporal  PainSc: [place]

## 2021-02-21 NOTE — Anesthesia Procedure Notes (Signed)
Procedure Name: MAC Date/Time: 02/21/2021 7:33 AM Performed by: Lily Peer, Isolde Skaff, CRNA Pre-anesthesia Checklist: Patient identified, Emergency Drugs available, Suction available, Patient being monitored and Timeout performed Patient Re-evaluated:Patient Re-evaluated prior to induction Oxygen Delivery Method: Nasal cannula Induction Type: IV induction

## 2021-02-21 NOTE — Anesthesia Preprocedure Evaluation (Signed)
Anesthesia Evaluation  Patient identified by MRN, date of birth, ID band Patient awake    Reviewed: Allergy & Precautions, NPO status , Patient's Chart, lab work & pertinent test results  History of Anesthesia Complications (+) history of anesthetic complications  Airway Mallampati: III  TM Distance: <3 FB Neck ROM: full    Dental  (+) Chipped   Pulmonary neg shortness of breath, former smoker,    Pulmonary exam normal        Cardiovascular hypertension, Normal cardiovascular exam     Neuro/Psych  Neuromuscular disease negative psych ROS   GI/Hepatic negative GI ROS, Neg liver ROS,   Endo/Other  negative endocrine ROS  Renal/GU      Musculoskeletal  (+) Arthritis ,   Abdominal   Peds  Hematology negative hematology ROS (+)   Anesthesia Other Findings Past Medical History: 03/11/2020: Dysplastic nevus     Comment:  upper back spine - DYSPLASTIC COMPOUND NEVUS WITH               MODERATE ATYPIA, CLOSE TO MARGIN 03/12/2019: Dysplastic nevus     Comment:  left mid back 4.0cm lat to spine - DYSPLASTIC COMPOUND               NEVUS WITH MILD ATYPIA, DEEP MARGIN INVOLVED No date: High cholesterol No date: Hypertension No date: Sinus trouble  Past Surgical History: No date: CHOLECYSTECTOMY 11/22/2017: COLONOSCOPY WITH PROPOFOL; N/A     Comment:  Procedure: COLONOSCOPY WITH PROPOFOL;  Surgeon:               Lollie Sails, MD;  Location: [facility] ENDOSCOPY;                Service: Endoscopy;  Laterality: N/A; No date: KNEE ARTHROSCOPY; Right 2000: LUMBAR FUSION; N/A     Comment:  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; Right No date: SKIN CANCER EXCISION     Comment:  Bx (+) BSC  BMI    Body Mass Index: 31.19 kg/m      Reproductive/Obstetrics negative OB ROS                             Anesthesia Physical Anesthesia Plan  ASA: 2  Anesthesia Plan: MAC   Post-op Pain Management:     Induction: Intravenous  PONV Risk Score and Plan:   Airway Management Planned: Natural Airway and Nasal Cannula  Additional Equipment:   Intra-op Plan:   Post-operative Plan:   Informed Consent: I have reviewed the patients History and Physical, chart, labs and discussed the procedure including the risks, benefits and alternatives for the proposed anesthesia with the patient or authorized representative who has indicated his/her understanding and acceptance.     Dental Advisory Given  Plan Discussed with: Anesthesiologist, CRNA and Surgeon  Anesthesia Plan Comments: (Thorough conversation with patient and Dr. Lacinda Axon about this anesthetic plan.  Dr. Lacinda Axon plans to use local but is concerned that the patient may need sedation to tolerated the procedure, that Dr. Lacinda Axon usually does this procedure under [place].  Patient demands that we not given him any anesthetic medications other than fentanyl.  Patient consented that he may be uncomfortable, that he may remember the whole procedure and that we may need to abort the procedure if we are not able to sedate him enough.  Patient demands to be awake for the whole procedure, he was consented that he may experience some sedation from fentanyl.  Patient voiced understanding.  Patient consented for risks of anesthesia including but not limited to:  - adverse reactions to medications - damage to eyes, teeth, lips or other oral mucosa - nerve damage due to positioning  - sore throat or hoarseness - Damage to heart, brain, nerves, lungs, other parts of body or loss of life  Patient voiced understanding.)        Anesthesia Quick Evaluation

## 2021-02-21 NOTE — Progress Notes (Signed)
PHARMACY -  BRIEF ANTIBIOTIC NOTE   Pharmacy has received consult(s) for Cefazolin from an OR provider.  The patient's profile has been reviewed for ht/wt/allergies/indication/available labs.    One time order(s) placed for Cefazolin 2 gm preop per pt wt: 105 kg.  Further antibiotics/pharmacy consults should be ordered by admitting physician if indicated.                       Thank you, Renda Rolls, PharmD, [facility] 02/21/2021 6:33 AM

## 2021-02-21 NOTE — Discharge Summary (Signed)
Patient is recovering well. Perry Jones has ambulated, voided, and eaten without issue. Pain adequately controlled on current pain regimen. Will continue post op pain management with tylenol, muscle relaxer, and pain medication as needed. Discussed activity restrictions and wound care. HE is scheduled to follow up in clinic in approximately 2 weeks. Advised to contact office if any questions or concerns arise before then.

## 2021-02-21 NOTE — Transfer of Care (Signed)
Immediate Anesthesia Transfer of Care Note  Patient: Perry Jones  Procedure(s) Performed: THORACIC SPINAL CORD STIMULATOR PERCUTANEOUS INSERTION & PULSE GENERATOR PLACEMENT  Patient Location: Short Stay  Anesthesia Type:MAC  Level of Consciousness: awake, alert  and oriented  Airway & Oxygen Therapy: Patient Spontanous Breathing  Post-op Assessment: Report given to RN and Post -op Vital signs reviewed and stable  Post vital signs: Reviewed and stable  Last Vitals:  Vitals Value Taken Time  BP 163/82   Temp    Pulse 95   Resp 14   SpO2 95     Last Pain:  Vitals:   02/21/21 0701  TempSrc: Oral  PainSc: 5          Complications: No notable events documented.

## 2021-03-02 ENCOUNTER — Encounter: Payer: Self-pay | Admitting: Neurosurgery

## 2021-03-17 ENCOUNTER — Ambulatory Visit
Payer: Medicare HMO | Attending: Student in an Organized Health Care Education/Training Program | Admitting: Student in an Organized Health Care Education/Training Program

## 2021-03-17 ENCOUNTER — Other Ambulatory Visit: Payer: Self-pay

## 2021-03-17 ENCOUNTER — Encounter: Payer: Self-pay | Admitting: Student in an Organized Health Care Education/Training Program

## 2021-03-17 ENCOUNTER — Ambulatory Visit (INDEPENDENT_AMBULATORY_CARE_PROVIDER_SITE_OTHER): Payer: Medicare HMO | Admitting: Dermatology

## 2021-03-17 VITALS — BP 122/93 | HR 81 | Temp 97.3°F | Resp 16 | Ht 72.0 in | Wt 227.0 lb

## 2021-03-17 DIAGNOSIS — Z86018 Personal history of other benign neoplasm: Secondary | ICD-10-CM | POA: Diagnosis not present

## 2021-03-17 DIAGNOSIS — M546 Pain in thoracic spine: Secondary | ICD-10-CM

## 2021-03-17 DIAGNOSIS — M5416 Radiculopathy, lumbar region: Secondary | ICD-10-CM | POA: Diagnosis present

## 2021-03-17 DIAGNOSIS — M961 Postlaminectomy syndrome, not elsewhere classified: Secondary | ICD-10-CM

## 2021-03-17 DIAGNOSIS — G894 Chronic pain syndrome: Secondary | ICD-10-CM

## 2021-03-17 DIAGNOSIS — Z79891 Long term (current) use of opiate analgesic: Secondary | ICD-10-CM

## 2021-03-17 DIAGNOSIS — L814 Other melanin hyperpigmentation: Secondary | ICD-10-CM

## 2021-03-17 DIAGNOSIS — M47816 Spondylosis without myelopathy or radiculopathy, lumbar region: Secondary | ICD-10-CM

## 2021-03-17 DIAGNOSIS — L578 Other skin changes due to chronic exposure to nonionizing radiation: Secondary | ICD-10-CM | POA: Diagnosis not present

## 2021-03-17 DIAGNOSIS — D229 Melanocytic nevi, unspecified: Secondary | ICD-10-CM

## 2021-03-17 DIAGNOSIS — L821 Other seborrheic keratosis: Secondary | ICD-10-CM

## 2021-03-17 DIAGNOSIS — D1800 Hemangioma unspecified site: Secondary | ICD-10-CM

## 2021-03-17 DIAGNOSIS — L219 Seborrheic dermatitis, unspecified: Secondary | ICD-10-CM | POA: Diagnosis not present

## 2021-03-17 DIAGNOSIS — Z981 Arthrodesis status: Secondary | ICD-10-CM | POA: Diagnosis present

## 2021-03-17 DIAGNOSIS — G8929 Other chronic pain: Secondary | ICD-10-CM

## 2021-03-17 DIAGNOSIS — Z1283 Encounter for screening for malignant neoplasm of skin: Secondary | ICD-10-CM

## 2021-03-17 DIAGNOSIS — Z79899 Other long term (current) drug therapy: Secondary | ICD-10-CM

## 2021-03-17 MED ORDER — HYDROCODONE-ACETAMINOPHEN 10-325 MG PO TABS: 1.0000 | ORAL_TABLET | Freq: Two times a day (BID) | ORAL | 0 refills | Status: DC | PRN

## 2021-03-17 MED ORDER — KETOCONAZOLE 2 % EX SHAM
1.0000 | MEDICATED_SHAMPOO | CUTANEOUS | 6 refills | Status: DC
Start: 2021-03-18 — End: 2021-04-20

## 2021-03-17 NOTE — Progress Notes (Signed)
Follow-Up Visit   Subjective  Perry Jones is a 70 y.o. male who presents for the following: Total body skin exam (Hx of Dysplastic nevi, Upper back spin, L mid back 4.0cm lat to spine). The patient presents for Total-Body Skin Exam (TBSE) for skin cancer screening and mole check.  The following portions of the chart were reviewed this encounter and updated as appropriate:   Tobacco  Allergies  Meds  Problems  Med Hx  Surg Hx  Fam Hx     Review of Systems:  No other skin or systemic complaints except as noted in HPI or Assessment and Plan.  Objective  Well appearing patient in no apparent distress; mood and affect are within normal limits.  A full examination was performed including scalp, head, eyes, ears, nose, lips, neck, chest, axillae, abdomen, back, buttocks, bilateral upper extremities, bilateral lower extremities, hands, feet, fingers, toes, fingernails, and toenails. All findings within normal limits unless otherwise noted below.  Upper back spine, L mid back 4.0cm lat to spine Scar with no evidence of recurrence.   scalp, face, ears Erythema and scale    Assessment & Plan   Lentigines - Scattered tan macules - Due to sun exposure - Benign-appearing, observe - Recommend daily broad spectrum sunscreen SPF 30+ to sun-exposed areas, reapply every 2 hours as needed. - Call for any changes  Seborrheic Keratoses - Stuck-on, waxy, tan-brown papules and/or plaques  - Benign-appearing - Discussed benign etiology and prognosis. - Observe - Call for any changes  Melanocytic Nevi - Tan-brown and/or pink-flesh-colored symmetric macules and papules - Benign appearing on exam today - Observation - Call clinic for new or changing moles - Recommend daily use of broad spectrum spf 30+ sunscreen to sun-exposed areas.   Hemangiomas - Red papules - Discussed benign nature - Observe - Call for any changes  Actinic Damage - Chronic condition, secondary to  cumulative UV/sun exposure - diffuse scaly erythematous macules with underlying dyspigmentation - Recommend daily broad spectrum sunscreen SPF 30+ to sun-exposed areas, reapply every 2 hours as needed.  - Staying in the shade or wearing long sleeves, sun glasses (UVA+UVB protection) and wide brim hats (4-inch brim around the entire circumference of the hat) are also recommended for sun protection.  - Call for new or changing lesions.  Skin cancer screening performed today.  History of dysplastic nevus Upper back spine, L mid back 4.0cm lat to spine  Clear. Observe for recurrence. Call clinic for new or changing lesions.  Recommend regular skin exams, daily broad-spectrum spf 30+ sunscreen use, and photoprotection.    Seborrheic dermatitis scalp, face, ears  Seborrheic Dermatitis  -  is a chronic persistent rash characterized by pinkness and scaling most commonly of the mid face but also can occur on the scalp (dandruff), ears; mid chest, mid back and groin.  It tends to be exacerbated by stress and cooler weather.  People who have neurologic disease may experience new onset or exacerbation of existing seborrheic dermatitis.  The condition is not curable but treatable and can be controlled.  Start Ketoconazole 2% shampoo to wash face, scalp, ears 3x/wk, let sit 5 minutes and rinse out  ketoconazole (NIZORAL) 2 % shampoo - scalp, face, ears Apply 1 application topically 3 (three) times a week. Wash face, scalp and ears 3 times weekly, let sit 5 minutes and rinse out  Skin cancer screening  Return in about 1 year (around 03/17/2022) for TBSE, Hx of Dysplastic nevi, seb derm.  I,  Othelia Pulling, RMA, am acting as scribe for Sarina Ser, MD . Documentation: I have reviewed the above documentation for accuracy and completeness, and I agree with the above.  Sarina Ser, MD

## 2021-03-17 NOTE — Patient Instructions (Signed)

## 2021-03-17 NOTE — Progress Notes (Signed)
Nursing Pain Medication Assessment:  Safety precautions to be maintained throughout the outpatient stay will include: orient to surroundings, keep bed in low position, maintain call bell within reach at all times, provide assistance with transfer out of bed and ambulation.  Medication Inspection Compliance: Pill count conducted under aseptic conditions, in front of the patient. Neither the pills nor the bottle was removed from the patient's sight at any time. Once count was completed pills were immediately returned to the patient in their original bottle.  Medication: Hydrocodone/APAP Pill/Patch Count:  12 of 60 pills remain Pill/Patch Appearance: Markings consistent with prescribed medication Bottle Appearance: Standard pharmacy container. Clearly labeled. Filled Date: 8 / 46 / 2022 Last Medication intake:  Today

## 2021-03-17 NOTE — Progress Notes (Signed)
PROVIDER NOTE: Information contained herein reflects review and annotations entered in association with encounter. Interpretation of such information and data should be left to medically-trained personnel. Information provided to patient can be located elsewhere in the medical record under "Patient Instructions". Document created using STT-dictation technology, any transcriptional errors that may result from process are unintentional.    Patient: Perry Jones  Service Category: E/M  Provider: Gillis Santa, MD  DOB: 24-Dec-1950  DOS: 03/17/2021  Specialty: Interventional Pain Management  MRN: [id number]  Setting: Ambulatory outpatient  PCP: Juluis Pitch, MD  Type: Established Patient    Referring Provider: Juluis Pitch, MD  Location: Office  Delivery: Face-to-face     HPI  [name], a 70 y.o. year old male, is here today because of his Chronic pain syndrome [G89.4]. [name] primary complain today is Back Pain (lower) Last encounter: My last encounter with him was on 01/20/21 Pertinent problems: [name] has Back muscle spasm; Chronic knee pain after total replacement of right knee joint; DDD (degenerative disc disease), lumbar; Essential hypertension; Chronic radicular lumbar pain; History of total right knee replacement; History of lumbar fusion (L1-L3); Chronic bilateral low back pain with bilateral sciatica; Chronic pain syndrome; Lumbar facet arthropathy; and Piriformis syndrome, right on their pertinent problem list. Pain Assessment: Severity of Chronic pain is reported as a 5 /10. Location: Back Lower/down back of right to foot, left side radiates to the hip. Onset: More than a month ago. Quality: Aching, Constant, Stabbing, Burning (fire and electric in the legs). Timing: Constant. Modifying factor(s): SCS will change from left  to right ( whichever is the worse). Vitals:  height is 6' (1.829 m) and weight is 227 lb (103 kg). His temperature is 97.3 F (36.3 C)  (abnormal). His blood pressure is 122/93 (abnormal) and his pulse is 81. His respiration is 16 and oxygen saturation is 98%.   Reason for encounter: medication management.  [name] follows up today for medication management.  Since his last visit with me, he has undergone a [employer] percutaneous spinal cord stimulator permanent implant.  He states that he is still working with Manuela Schwartz with [employer] to optimize settings.  He states that he feels more pain relief on the right side at lower settings that that pain relief diminishes at higher settings on the left.  I encouraged him to discuss this with Manuela Schwartz.  Otherwise he continues his hydrocodone as prescribed and states that it does help manage moderate to severe breakthrough pain whenever he does have it.  We will refill as below.  Patient will follow-up in 3 months.  Pharmacotherapy Assessment  Analgesic: hydrocodone 10 mg BID prn    Monitoring: [place] PMP: PDMP reviewed during this encounter.       Pharmacotherapy: No side-effects or adverse reactions reported. Compliance: No problems identified. Effectiveness: Clinically acceptable.  Ignatius Specking, RN  03/17/2021  1:50 PM  Sign when Signing Visit Nursing Pain Medication Assessment:  Safety precautions to be maintained throughout the outpatient stay will include: orient to surroundings, keep bed in low position, maintain call bell within reach at all times, provide assistance with transfer out of bed and ambulation.  Medication Inspection Compliance: Pill count conducted under aseptic conditions, in front of the patient. Neither the pills nor the bottle was removed from the patient's sight at any time. Once count was completed pills were immediately returned to the patient in their original bottle.  Medication: Hydrocodone/APAP Pill/Patch Count:  12 of 60 pills remain Pill/Patch  Appearance: Markings consistent with prescribed medication Bottle Appearance: Standard pharmacy container. Clearly  labeled. Filled Date: 8 / 84 / 2022 Last Medication intake:  Today          UDS:  Summary  Date Value Ref Range Status  04/01/2020 Note  Final    Comment:    ==================================================================== Compliance Drug Analysis, Ur ==================================================================== Test                             Result       Flag       Units  Drug Present and Declared for Prescription Verification   Lorazepam                      54           EXPECTED   ng/mg creat    Source of lorazepam is a scheduled prescription medication.    Hydrocodone                    913          EXPECTED   ng/mg creat   Hydromorphone                  698          EXPECTED   ng/mg creat   Dihydrocodeine                 69           EXPECTED   ng/mg creat   Norhydrocodone                 1562         EXPECTED   ng/mg creat    Sources of hydrocodone include scheduled prescription medications.    Hydromorphone, dihydrocodeine and norhydrocodone are expected    metabolites of hydrocodone. Hydromorphone and dihydrocodeine are    also available as scheduled prescription medications.    Gabapentin                     PRESENT      EXPECTED   Orphenadrine                   PRESENT      EXPECTED   Zolpidem                       PRESENT      EXPECTED   Zolpidem Acid                  PRESENT      EXPECTED    Zolpidem acid is an expected metabolite of zolpidem.    Acetaminophen                  PRESENT      EXPECTED  Drug Absent but Declared for Prescription Verification   Cyclobenzaprine                Not Detected UNEXPECTED   Nortriptyline                  Not Detected UNEXPECTED ==================================================================== Test                      Result    Flag   Units      Ref Range   Creatinine  128              mg/dL      >=20 ==================================================================== Declared Medications:   The flagging and interpretation on this report are based on the  following declared medications.  Unexpected results may arise from  inaccuracies in the declared medications.   **Note: The testing scope of this panel includes these medications:   Cyclobenzaprine (Flexeril)  Gabapentin (Neurontin)  Hydrocodone (Norco)  Lorazepam (Ativan)  Nortriptyline (Pamelor)  Orphenadrine (Norflex)   **Note: The testing scope of this panel does not include small to  moderate amounts of these reported medications:   Acetaminophen (Norco)  Zolpidem (Ambien)   **Note: The testing scope of this panel does not include the  following reported medications:   Benazepril  Biotin  Hydrochlorothiazide  Loratadine  Multivitamin  Supplement ==================================================================== For clinical consultation, please call [phone]. ====================================================================      ROS  Constitutional: Denies any fever or chills Gastrointestinal: No reported hemesis, hematochezia, vomiting, or acute GI distress Musculoskeletal: Denies any acute onset joint swelling, redness, loss of ROM, or weakness Neurological: No reported episodes of acute onset apraxia, aphasia, dysarthria, agnosia, amnesia, paralysis, loss of coordination, or loss of consciousness  Medication Review  Biotin, Calcium Polycarbophil, HYDROcodone-acetaminophen, Loratadine, Multi-Vitamins, benazepril-hydrochlorthiazide, cyclobenzaprine, diclofenac Sodium, docusate sodium, gabapentin, ketoconazole, and zolpidem  History Review  Allergy: Mr. Stamos is allergic to prednisone. Drug: Mr. Lazalde  reports no history of drug use. Alcohol:  reports current alcohol use. Tobacco:  reports that he quit smoking about 36 years ago. His smoking use included cigarettes. He has a 36.00 pack-year smoking history. He has never used smokeless tobacco. Social: Mr. Mirabal  reports that he quit  smoking about 36 years ago. His smoking use included cigarettes. He has a 36.00 pack-year smoking history. He has never used smokeless tobacco. He reports current alcohol use. He reports that he does not use drugs. Medical:  has a past medical history of Dysplastic nevus (03/11/2020), Dysplastic nevus (03/12/2019), High cholesterol, Hypertension, and Sinus trouble. Surgical: Mr. Cuthrell  has a past surgical history that includes Knee arthroscopy (Right); Colonoscopy with propofol (N/A, 11/22/2017); Cholecystectomy; Lumbar fusion (N/A, 2000); Skin cancer excision; Replacement total knee (Right, 2007); Spinal cord stimulator insertion (N/A, 02/21/2021); and Spinal cord stimulator implant (02/21/2021). Family: family history includes Dementia in his mother; Heart Problems in his mother; Lung cancer in his father.  Laboratory Chemistry Profile   Renal Lab Results  Component Value Date   BUN 12 02/10/2021   CREATININE 0.72 02/10/2021   GFRAA >60 07/14/2018   GFRNONAA >60 02/10/2021    Hepatic Lab Results  Component Value Date   AST 45 (H) 09/28/2020   ALT 37 09/28/2020   ALBUMIN 4.5 09/28/2020   ALKPHOS 44 09/28/2020   LIPASE 28 07/14/2018    Electrolytes Lab Results  Component Value Date   NA 139 02/10/2021   K 3.5 02/10/2021   CL 105 02/10/2021   CALCIUM 9.6 02/10/2021    Bone Lab Results  Component Value Date   TESTOSTERONE 45 (L) 09/28/2020    Inflammation (CRP: Acute Phase) (ESR: Chronic Phase) No results found for: CRP, ESRSEDRATE, LATICACIDVEN       Note: Above Lab results reviewed.  Recent Imaging Review  DG Thoracic Spine 2 View CLINICAL DATA:  Spinal stimulator placement  EXAM: THORACIC SPINE 2 VIEWS  COMPARISON:  None.  FLUOROSCOPY TIME:  Radiation Exposure Index (as provided by the fluoroscopic device): Not available  If the device does not  provide the exposure index:  Fluoroscopy Time:  5 minutes 4 seconds  Number of Acquired Images:   2  FINDINGS: Spinal stimulator leads are noted spanning from T8-T10 levels. No other focal abnormality is noted.  IMPRESSION: Spinal stimulator placement in the mid to lower thoracic spine.  Electronically Signed   By: Inez Catalina M.D.   On: 02/21/2021 09:23 DG C-Arm 1-60 Min-No Report Fluoroscopy was utilized by the requesting physician.  No radiographic  interpretation.  DG C-Arm 1-60 Min-No Report Fluoroscopy was utilized by the requesting physician.  No radiographic  interpretation.  Note: Reviewed        Physical Exam  General appearance: Well nourished, well developed, and well hydrated. In no apparent acute distress Mental status: Alert, oriented x 3 (person, place, & time)       Respiratory: No evidence of acute respiratory distress Eyes: PERLA Vitals: BP (!) 122/93   Pulse 81   Temp (!) 97.3 F (36.3 C)   Resp 16   Ht 6' (1.829 m)   Wt 227 lb (103 kg)   SpO2 98%   BMI 30.79 kg/m  BMI: Estimated body mass index is 30.79 kg/m as calculated from the following:   Height as of this encounter: 6' (1.829 m).   Weight as of this encounter: 227 lb (103 kg). Ideal: Ideal body weight: 77.6 kg (171 lb 1.2 oz) Adjusted ideal body weight: 87.7 kg (193 lb 7.1 oz)  Lumbar Spine Area Exam  Skin & Axial Inspection: Well healed scar from previous spine surgery detected Alignment: Symmetrical Functional ROM: Pain restricted ROM       Stability: No instability detected Muscle Tone/Strength: Functionally intact. No obvious neuro-muscular anomalies detected. Sensory (Neurological): Dermatomal pain pattern improved after spinal cord stimulation.  5 out of 5 strength bilateral lower extremity: Plantar flexion, dorsiflexion, knee flexion, knee extension.   Assessment   Status Diagnosis  Controlled Controlled Controlled 1. Chronic pain syndrome   2. Pharmacologic therapy   3. Chronic use of opiate for therapeutic purpose   4. Failed back surgical syndrome   5. History of  lumbar fusion (L3-S1)   6. Post laminectomy syndrome   7. Chronic radicular lumbar pain   8. Thoracic spine pain   9. Lumbar facet arthropathy      Plan of Care  Problem-specific:  No problem-specific Assessment & Plan notes found for this encounter.  Mr. Knowledge Escandon has a current medication list which includes the following long-term medication(s): benazepril-hydrochlorthiazide, gabapentin, loratadine, zolpidem, [START ON 03/22/2021] hydrocodone-acetaminophen, [START ON 04/21/2021] hydrocodone-acetaminophen, and [START ON 05/21/2021] hydrocodone-acetaminophen.  Pharmacotherapy (Medications Ordered): Meds ordered this encounter  Medications   HYDROcodone-acetaminophen (NORCO) 10-325 MG tablet    Sig: Take 1 tablet by mouth every 12 (twelve) hours as needed for severe pain. Must last 30 days.    Dispense:  60 tablet    Refill:  0    Chronic Pain: STOP Act (Not applicable) Fill 1 day early if closed on refill date. Avoid benzodiazepines within 8 hours of opioids   HYDROcodone-acetaminophen (NORCO) 10-325 MG tablet    Sig: Take 1 tablet by mouth every 12 (twelve) hours as needed for severe pain. Must last 30 days.    Dispense:  60 tablet    Refill:  0    Chronic Pain: STOP Act (Not applicable) Fill 1 day early if closed on refill date. Avoid benzodiazepines within 8 hours of opioids   HYDROcodone-acetaminophen (NORCO) 10-325 MG tablet    Sig: Take 1  tablet by mouth every 12 (twelve) hours as needed for severe pain. Must last 30 days.    Dispense:  60 tablet    Refill:  0    Chronic Pain: STOP Act (Not applicable) Fill 1 day early if closed on refill date. Avoid benzodiazepines within 8 hours of opioids   Follow-up plan:   Return in about 3 months (around 06/16/2021) for Medication Management, in person.        Recent Visits Date Type Provider Dept  01/20/21 Telemedicine Gillis Santa, MD [facility]  12/22/20 Procedure visit Gillis Santa, MD [facility]   Showing recent visits within past 90 days and meeting all other requirements Today's Visits Date Type Provider Dept  03/17/21 Office Visit Gillis Santa, MD [facility]  Showing today's visits and meeting all other requirements Future Appointments No visits were found meeting these conditions. Showing future appointments within next 90 days and meeting all other requirements I discussed the assessment and treatment plan with the patient. The patient was provided an opportunity to ask questions and all were answered. The patient agreed with the plan and demonstrated an understanding of the instructions.  Patient advised to call back or seek an in-person evaluation if the symptoms or condition worsens.  Duration of encounter: 23mnutes.  Note by: BGillis Santa MD Date: 03/17/2021; Time: 2:24 PM

## 2021-03-22 ENCOUNTER — Encounter: Payer: Self-pay | Admitting: Student in an Organized Health Care Education/Training Program

## 2021-03-23 ENCOUNTER — Encounter: Payer: Self-pay | Admitting: Dermatology

## 2021-04-18 ENCOUNTER — Telehealth: Payer: Self-pay

## 2021-04-18 NOTE — Telephone Encounter (Signed)
Pt asks a nurse to call him due to a procedure he had he asks does [employer] need to be present with him for his next appointment?

## 2021-04-19 ENCOUNTER — Encounter: Payer: Self-pay | Admitting: Student in an Organized Health Care Education/Training Program

## 2021-04-20 ENCOUNTER — Ambulatory Visit
Payer: Medicare HMO | Attending: Student in an Organized Health Care Education/Training Program | Admitting: Student in an Organized Health Care Education/Training Program

## 2021-04-20 ENCOUNTER — Other Ambulatory Visit: Payer: Self-pay

## 2021-04-20 ENCOUNTER — Encounter: Payer: Self-pay | Admitting: Student in an Organized Health Care Education/Training Program

## 2021-04-20 VITALS — BP 144/87 | HR 70 | Temp 96.9°F | Resp 16 | Ht 72.0 in | Wt 229.0 lb

## 2021-04-20 DIAGNOSIS — G894 Chronic pain syndrome: Secondary | ICD-10-CM | POA: Insufficient documentation

## 2021-04-20 DIAGNOSIS — Z981 Arthrodesis status: Secondary | ICD-10-CM | POA: Diagnosis not present

## 2021-04-20 DIAGNOSIS — M961 Postlaminectomy syndrome, not elsewhere classified: Secondary | ICD-10-CM | POA: Diagnosis not present

## 2021-04-20 DIAGNOSIS — G8929 Other chronic pain: Secondary | ICD-10-CM | POA: Insufficient documentation

## 2021-04-20 DIAGNOSIS — Z79891 Long term (current) use of opiate analgesic: Secondary | ICD-10-CM | POA: Diagnosis present

## 2021-04-20 DIAGNOSIS — M5416 Radiculopathy, lumbar region: Secondary | ICD-10-CM | POA: Insufficient documentation

## 2021-04-20 DIAGNOSIS — Z79899 Other long term (current) drug therapy: Secondary | ICD-10-CM | POA: Insufficient documentation

## 2021-04-20 MED ORDER — HYDROCODONE-ACETAMINOPHEN 10-325 MG PO TABS: 1.0000 | ORAL_TABLET | Freq: Three times a day (TID) | ORAL | 0 refills | Status: DC | PRN

## 2021-04-20 NOTE — Progress Notes (Signed)
Nursing Pain Medication Assessment:  Safety precautions to be maintained throughout the outpatient stay will include: orient to surroundings, keep bed in low position, maintain call bell within reach at all times, provide assistance with transfer out of bed and ambulation.  Medication Inspection Compliance: Pill count conducted under aseptic conditions, in front of the patient. Neither the pills nor the bottle was removed from the patient's sight at any time. Once count was completed pills were immediately returned to the patient in their original bottle.  Medication: Hydrocodone/APAP Pill/Patch Count:  6 of 60 pills remain Pill/Patch Appearance: Markings consistent with prescribed medication Bottle Appearance: Standard pharmacy container. Clearly labeled. Filled Date: 09 / 27 / 2022 Last Medication intake:  Today

## 2021-04-20 NOTE — Progress Notes (Signed)
PROVIDER NOTE: Information contained herein reflects review and annotations entered in association with encounter. Interpretation of such information and data should be left to medically-trained personnel. Information provided to patient can be located elsewhere in the medical record under "Patient Instructions". Document created using STT-dictation technology, any transcriptional errors that may result from process are unintentional.    Patient: Perry Jones  Service Category: E/M  Provider: Gillis Santa, MD  DOB: 12-25-1950  DOS: 04/20/2021  Specialty: Interventional Pain Management  MRN: [id number]  Setting: Ambulatory outpatient  PCP: Juluis Pitch, MD  Type: Established Patient    Referring Provider: Juluis Pitch, MD  Location: Office  Delivery: Face-to-face     HPI  [name], a 69 y.o. year old male, is here today because of his Chronic pain syndrome [G89.4]. [name] primary complain today is Back Pain (lower) Last encounter: My last encounter with him was on 03/17/21 Pertinent problems: [name] has Back muscle spasm; Chronic knee pain after total replacement of right knee joint; DDD (degenerative disc disease), lumbar; Essential hypertension; Chronic radicular lumbar pain; History of total right knee replacement; History of lumbar fusion (L1-L3); Chronic bilateral low back pain with bilateral sciatica; Chronic pain syndrome; Lumbar facet arthropathy; and Piriformis syndrome, right on their pertinent problem list. Pain Assessment: Severity of Chronic pain is reported as a 6 /10. Location: Back Lower/right leg to the foot and left hip. Onset: More than a month ago. Quality: Stabbing. Timing: Constant. Modifying factor(s): SCS, Hydrocodone, sleeping. Vitals:  height is 6' (1.829 m) and weight is 229 lb (103.9 kg). His temporal temperature is 96.9 F (36.1 C) (abnormal). His blood pressure is 144/87 (abnormal) and his pulse is 70. His respiration is 16 and oxygen  saturation is 99%.   Reason for encounter: medication management.  [name] follows up for medication management as well as new symptoms of headache.  He states that he has been having more frequent headaches that are noticeable in the evening when he tries to lay down.  He believes that they are correlated with his stimulation.  He states that when he discontinues stimulation, his headaches improved.  He has been using various settings and he states that setting see does not correlate with as many headaches.  He has contacted [employer] in the past.  [employer] Representative Cyril Mourning was present today to help optimize DTM settings by turning down rate to see if that would help.  Impedances looked okay.  In the interim, patient is requesting an increase in his hydrocodone from twice daily to 3 times daily as needed.  I will increase his dose as below.  We will monitor him over the next 2 to 4 weeks to see if his headaches improve with changing stimulation parameters.  If they do not, we will consider repeat imaging and possible referral to neurology.  Pharmacotherapy Assessment  Analgesic: hydrocodone 10 mg BID, transition to 3 times daily as needed    Monitoring: [place] PMP: PDMP reviewed during this encounter.       Pharmacotherapy: No side-effects or adverse reactions reported. Compliance: No problems identified. Effectiveness: Clinically acceptable.  Landis Martins, RN  04/20/2021 11:29 AM  Sign when Signing Visit Nursing Pain Medication Assessment:  Safety precautions to be maintained throughout the outpatient stay will include: orient to surroundings, keep bed in low position, maintain call bell within reach at all times, provide assistance with transfer out of bed and ambulation.  Medication Inspection Compliance: Pill count conducted under aseptic conditions, in  front of the patient. Neither the pills nor the bottle was removed from the patient's sight at any time. Once count was completed pills  were immediately returned to the patient in their original bottle.  Medication: Hydrocodone/APAP Pill/Patch Count:  6 of 60 pills remain Pill/Patch Appearance: Markings consistent with prescribed medication Bottle Appearance: Standard pharmacy container. Clearly labeled. Filled Date: 09 / 27 / 2022 Last Medication intake:  Today   UDS:  Summary  Date Value Ref Range Status  04/01/2020 Note  Final    Comment:    ==================================================================== Compliance Drug Analysis, Ur ==================================================================== Test                             Result       Flag       Units  Drug Present and Declared for Prescription Verification   Lorazepam                      54           EXPECTED   ng/mg creat    Source of lorazepam is a scheduled prescription medication.    Hydrocodone                    913          EXPECTED   ng/mg creat   Hydromorphone                  698          EXPECTED   ng/mg creat   Dihydrocodeine                 69           EXPECTED   ng/mg creat   Norhydrocodone                 1562         EXPECTED   ng/mg creat    Sources of hydrocodone include scheduled prescription medications.    Hydromorphone, dihydrocodeine and norhydrocodone are expected    metabolites of hydrocodone. Hydromorphone and dihydrocodeine are    also available as scheduled prescription medications.    Gabapentin                     PRESENT      EXPECTED   Orphenadrine                   PRESENT      EXPECTED   Zolpidem                       PRESENT      EXPECTED   Zolpidem Acid                  PRESENT      EXPECTED    Zolpidem acid is an expected metabolite of zolpidem.    Acetaminophen                  PRESENT      EXPECTED  Drug Absent but Declared for Prescription Verification   Cyclobenzaprine                Not Detected UNEXPECTED   Nortriptyline                  Not Detected  UNEXPECTED ==================================================================== Test  Result    Flag   Units      Ref Range   Creatinine              128              mg/dL      >=20 ==================================================================== Declared Medications:  The flagging and interpretation on this report are based on the  following declared medications.  Unexpected results may arise from  inaccuracies in the declared medications.   **Note: The testing scope of this panel includes these medications:   Cyclobenzaprine (Flexeril)  Gabapentin (Neurontin)  Hydrocodone (Norco)  Lorazepam (Ativan)  Nortriptyline (Pamelor)  Orphenadrine (Norflex)   **Note: The testing scope of this panel does not include small to  moderate amounts of these reported medications:   Acetaminophen (Norco)  Zolpidem (Ambien)   **Note: The testing scope of this panel does not include the  following reported medications:   Benazepril  Biotin  Hydrochlorothiazide  Loratadine  Multivitamin  Supplement ==================================================================== For clinical consultation, please call [phone]. ====================================================================      ROS  Constitutional:  Headaches Gastrointestinal: No reported hemesis, hematochezia, vomiting, or acute GI distress Musculoskeletal: Denies any acute onset joint swelling, redness, loss of ROM, or weakness Neurological: No reported episodes of acute onset apraxia, aphasia, dysarthria, agnosia, amnesia, paralysis, loss of coordination, or loss of consciousness  Medication Review  Biotin, Calcium Polycarbophil, HYDROcodone-acetaminophen, Loratadine, Multi-Vitamins, benazepril-hydrochlorthiazide, cyclobenzaprine, diclofenac Sodium, docusate sodium, gabapentin, and zolpidem  History Review  Allergy: [name] is allergic to prednisone. Drug: [name]  reports no history of  drug use. Alcohol:  reports current alcohol use. Tobacco:  reports that he quit smoking about 36 years ago. His smoking use included cigarettes. He has a 36.00 pack-year smoking history. He has never used smokeless tobacco. Social: [name]  reports that he quit smoking about 36 years ago. His smoking use included cigarettes. He has a 36.00 pack-year smoking history. He has never used smokeless tobacco. He reports current alcohol use. He reports that he does not use drugs. Medical:  has a past medical history of Dysplastic nevus (03/11/2020), Dysplastic nevus (03/12/2019), High cholesterol, Hypertension, and Sinus trouble. Surgical: [name]  has a past surgical history that includes Knee arthroscopy (Right); Colonoscopy with propofol (N/A, 11/22/2017); Cholecystectomy; Lumbar fusion (N/A, 2000); Skin cancer excision; Replacement total knee (Right, 2007); Spinal cord stimulator insertion (N/A, 02/21/2021); and Spinal cord stimulator implant (02/21/2021). Family: family history includes Dementia in his mother; Heart Problems in his mother; Lung cancer in his father.  Laboratory Chemistry Profile   Renal Lab Results  Component Value Date   BUN 12 02/10/2021   CREATININE 0.72 02/10/2021   GFRAA >60 07/14/2018   GFRNONAA >60 02/10/2021    Hepatic Lab Results  Component Value Date   AST 45 (H) 09/28/2020   ALT 37 09/28/2020   ALBUMIN 4.5 09/28/2020   ALKPHOS 44 09/28/2020   LIPASE 28 07/14/2018    Electrolytes Lab Results  Component Value Date   NA 139 02/10/2021   K 3.5 02/10/2021   CL 105 02/10/2021   CALCIUM 9.6 02/10/2021    Bone Lab Results  Component Value Date   TESTOSTERONE 45 (L) 09/28/2020    Inflammation (CRP: Acute Phase) (ESR: Chronic Phase) No results found for: CRP, ESRSEDRATE, LATICACIDVEN       Note: Above Lab results reviewed.  Recent Imaging Review  DG Thoracic Spine 2 View CLINICAL DATA:  Spinal stimulator placement  EXAM: THORACIC SPINE 2  VIEWS  COMPARISON:  None.  FLUOROSCOPY TIME:  Radiation Exposure Index (as provided by the fluoroscopic device): Not available  If the device does not provide the exposure index:  Fluoroscopy Time:  5 minutes 4 seconds  Number of Acquired Images:  2  FINDINGS: Spinal stimulator leads are noted spanning from T8-T10 levels. No other focal abnormality is noted.  IMPRESSION: Spinal stimulator placement in the mid to lower thoracic spine.  Electronically Signed   By: Inez Catalina M.D.   On: 02/21/2021 09:23 DG C-Arm 1-60 Min-No Report Fluoroscopy was utilized by the requesting physician.  No radiographic  interpretation.  DG C-Arm 1-60 Min-No Report Fluoroscopy was utilized by the requesting physician.  No radiographic  interpretation.  Note: Reviewed        Physical Exam  General appearance: Well nourished, well developed, and well hydrated. In no apparent acute distress Mental status: Alert, oriented x 3 (person, place, & time)       Respiratory: No evidence of acute respiratory distress Eyes: PERLA Vitals: BP (!) 144/87   Pulse 70   Temp (!) 96.9 F (36.1 C) (Temporal)   Resp 16   Ht 6' (1.829 m)   Wt 229 lb (103.9 kg)   SpO2 99%   BMI 31.06 kg/m  BMI: Estimated body mass index is 31.06 kg/m as calculated from the following:   Height as of this encounter: 6' (1.829 m).   Weight as of this encounter: 229 lb (103.9 kg). Ideal: Ideal body weight: 77.6 kg (171 lb 1.2 oz) Adjusted ideal body weight: 88.1 kg (194 lb 3.9 oz)  Positive for headaches, negative for any vision changes, negative for any photophobia, negative for any phonophobia Headaches not positional in nature  Lumbar Spine Area Exam  Skin & Axial Inspection: Well healed scar from previous spine surgery detected Alignment: Symmetrical Functional ROM: Pain restricted ROM       Stability: No instability detected Muscle Tone/Strength: Functionally intact. No obvious neuro-muscular anomalies  detected. Sensory (Neurological): Dermatomal pain pattern improved after spinal cord stimulation.  5 out of 5 strength bilateral lower extremity: Plantar flexion, dorsiflexion, knee flexion, knee extension.   Assessment   Status Diagnosis  Controlled Controlled Controlled 1. Chronic pain syndrome   2. Pharmacologic therapy   3. Failed back surgical syndrome   4. History of lumbar fusion (L3-S1)   5. Post laminectomy syndrome   6. Chronic radicular lumbar pain   7. Chronic use of opiate for therapeutic purpose      Plan of Care  Problem-specific:  No problem-specific Assessment & Plan notes found for this encounter.  [name] has a current medication list which includes the following long-term medication(s): benazepril-hydrochlorthiazide, gabapentin, loratadine, zolpidem, hydrocodone-acetaminophen, [START ON 05/20/2021] hydrocodone-acetaminophen, and [START ON 06/19/2021] hydrocodone-acetaminophen.  Pharmacotherapy (Medications Ordered): Meds ordered this encounter  Medications   HYDROcodone-acetaminophen (NORCO) 10-325 MG tablet    Sig: Take 1 tablet by mouth every 8 (eight) hours as needed for severe pain. Must last 30 days.    Dispense:  90 tablet    Refill:  0    Chronic Pain: STOP Act (Not applicable) Fill 1 day early if closed on refill date. Avoid benzodiazepines within 8 hours of opioids   HYDROcodone-acetaminophen (NORCO) 10-325 MG tablet    Sig: Take 1 tablet by mouth every 8 (eight) hours as needed for severe pain. Must last 30 days.    Dispense:  90 tablet    Refill:  0    Chronic Pain:  STOP Act (Not applicable) Fill 1 day early if closed on refill date. Avoid benzodiazepines within 8 hours of opioids   HYDROcodone-acetaminophen (NORCO) 10-325 MG tablet    Sig: Take 1 tablet by mouth every 8 (eight) hours as needed for severe pain. Must last 30 days.    Dispense:  90 tablet    Refill:  0    Chronic Pain: STOP Act (Not applicable) Fill 1 day early if  closed on refill date. Avoid benzodiazepines within 8 hours of opioids    We adjusted Dans' spinal cord stimulator program settings to see if that has any impact on his headache.  Follow-up plan:   Return in about 3 months (around 07/21/2021) for Medication Management, in person.        Recent Visits Date Type Provider Dept  03/17/21 Office Visit Gillis Santa, MD [facility]  01/20/21 Telemedicine Gillis Santa, MD [facility]  Showing recent visits within past 90 days and meeting all other requirements Today's Visits Date Type Provider Dept  04/20/21 Office Visit Gillis Santa, MD [facility]  Showing today's visits and meeting all other requirements Future Appointments Date Type Provider Dept  07/14/21 Appointment Gillis Santa, MD [facility]  Showing future appointments within next 90 days and meeting all other requirements I discussed the assessment and treatment plan with the patient. The patient was provided an opportunity to ask questions and all were answered. The patient agreed with the plan and demonstrated an understanding of the instructions.  Patient advised to call back or seek an in-person evaluation if the symptoms or condition worsens.  Duration of encounter: 72mnutes.  Note by: BGillis Santa MD Date: 04/20/2021; Time: 2:43 PM

## 2021-06-14 ENCOUNTER — Other Ambulatory Visit: Payer: Self-pay | Admitting: Student in an Organized Health Care Education/Training Program

## 2021-06-14 DIAGNOSIS — Z79899 Other long term (current) drug therapy: Secondary | ICD-10-CM

## 2021-06-14 DIAGNOSIS — G894 Chronic pain syndrome: Secondary | ICD-10-CM

## 2021-06-14 DIAGNOSIS — Z79891 Long term (current) use of opiate analgesic: Secondary | ICD-10-CM

## 2021-06-16 ENCOUNTER — Encounter: Payer: Medicare HMO | Admitting: Student in an Organized Health Care Education/Training Program

## 2021-07-14 ENCOUNTER — Encounter: Payer: Self-pay | Admitting: Student in an Organized Health Care Education/Training Program

## 2021-07-14 ENCOUNTER — Other Ambulatory Visit: Payer: Self-pay

## 2021-07-14 ENCOUNTER — Ambulatory Visit
Payer: Medicare HMO | Attending: Student in an Organized Health Care Education/Training Program | Admitting: Student in an Organized Health Care Education/Training Program

## 2021-07-14 VITALS — BP 141/82 | HR 72 | Temp 97.1°F | Ht 72.0 in | Wt 232.0 lb

## 2021-07-14 DIAGNOSIS — M47816 Spondylosis without myelopathy or radiculopathy, lumbar region: Secondary | ICD-10-CM | POA: Diagnosis present

## 2021-07-14 DIAGNOSIS — Z79891 Long term (current) use of opiate analgesic: Secondary | ICD-10-CM | POA: Insufficient documentation

## 2021-07-14 DIAGNOSIS — M5416 Radiculopathy, lumbar region: Secondary | ICD-10-CM | POA: Insufficient documentation

## 2021-07-14 DIAGNOSIS — G8929 Other chronic pain: Secondary | ICD-10-CM | POA: Diagnosis present

## 2021-07-14 DIAGNOSIS — G894 Chronic pain syndrome: Secondary | ICD-10-CM | POA: Insufficient documentation

## 2021-07-14 DIAGNOSIS — Z79899 Other long term (current) drug therapy: Secondary | ICD-10-CM | POA: Diagnosis not present

## 2021-07-14 DIAGNOSIS — M961 Postlaminectomy syndrome, not elsewhere classified: Secondary | ICD-10-CM | POA: Insufficient documentation

## 2021-07-14 DIAGNOSIS — M546 Pain in thoracic spine: Secondary | ICD-10-CM | POA: Diagnosis present

## 2021-07-14 DIAGNOSIS — Z981 Arthrodesis status: Secondary | ICD-10-CM | POA: Diagnosis not present

## 2021-07-14 MED ORDER — HYDROCODONE-ACETAMINOPHEN 10-325 MG PO TABS: 1.0000 | ORAL_TABLET | Freq: Three times a day (TID) | ORAL | 0 refills | Status: DC | PRN

## 2021-07-14 NOTE — Progress Notes (Signed)
Nursing Pain Medication Assessment:  Safety precautions to be maintained throughout the outpatient stay will include: orient to surroundings, keep bed in low position, maintain call bell within reach at all times, provide assistance with transfer out of bed and ambulation.  Medication Inspection Compliance: Pill count conducted under aseptic conditions, in front of the patient. Neither the pills nor the bottle was removed from the patient's sight at any time. Once count was completed pills were immediately returned to the patient in their original bottle.  Medication: Hydrocodone/APAP Pill/Patch Count:  32 1/2 of 90 pills remain Pill/Patch Appearance: Markings consistent with prescribed medication Bottle Appearance: Standard pharmacy container. Clearly labeled. Filled Date: 58 / 28 / 2022 Last Medication intake:  TodaySafety precautions to be maintained throughout the outpatient stay will include: orient to surroundings, keep bed in low position, maintain call bell within reach at all times, provide assistance with transfer out of bed and ambulation.

## 2021-07-14 NOTE — Progress Notes (Signed)
PROVIDER NOTE: Information contained herein reflects review and annotations entered in association with encounter. Interpretation of such information and data should be left to medically-trained personnel. Information provided to patient can be located elsewhere in the medical record under "Patient Instructions". Document created using STT-dictation technology, any transcriptional errors that may result from process are unintentional.    Patient: Perry Jones  Service Category: E/M  Provider: Gillis Santa, MD  DOB: January 19, 1951  DOS: 07/14/2021  Specialty: Interventional Pain Management  MRN: [id number]  Setting: Ambulatory outpatient  PCP: Juluis Pitch, MD  Type: Established Patient    Referring Provider: Juluis Pitch, MD  Location: Office  Delivery: Face-to-face     HPI  [name], a 71 y.o. year old male, is here today because of his Chronic pain syndrome [G89.4]. [name] primary complain today is Back Pain (lower)  Last encounter: My last encounter with him was on 04/20/21 Pertinent problems: Perry Jones has Back muscle spasm; Chronic knee pain after total replacement of right knee joint; DDD (degenerative disc disease), lumbar; Essential hypertension; Chronic radicular lumbar pain; History of total right knee replacement; History of lumbar fusion (L1-L3); Chronic bilateral low back pain with bilateral sciatica; Chronic pain syndrome; Lumbar facet arthropathy; and Piriformis syndrome, right on their pertinent problem list. Pain Assessment: Severity of Chronic pain is reported as a 6 /10. Location: Back Lower/pain radiaties down both leg to his foot, the right side is the worse. Onset: More than a month ago. Quality: Tingling, Burning, Pressure, Constant. Timing: Constant. Modifying factor(s): meds, stimalator, ice pack, volartan gel. Vitals:  height is 6' (1.829 m) and weight is 232 lb (105.2 kg). His temperature is 97.1 F (36.2 C) (abnormal). His blood pressure is 141/82  (abnormal) and his pulse is 72. His oxygen saturation is 100%.   Reason for encounter: medication management.  Medication management, refill of hydrocodone as below.  Patient is still attempting to optimize his spinal cord stimulator settings.  His spinal cord stimulator is in the correct place, no evidence of lead fracture migration.  He is working with [employer] to optimize his pain relief.  He states that his pain relief during the course of the trial was approximately 70 to 75% and now he is only experiencing 35% pain relief.   Pharmacotherapy Assessment  Analgesic: hydrocodone 10 mg, 3 times daily as needed    Monitoring: [place] PMP: PDMP reviewed during this encounter.       Pharmacotherapy: No side-effects or adverse reactions reported. Compliance: No problems identified. Effectiveness: Clinically acceptable.  Perry Fischer, RN  07/14/2021 10:37 AM  Sign when Signing Visit Nursing Pain Medication Assessment:  Safety precautions to be maintained throughout the outpatient stay will include: orient to surroundings, keep bed in low position, maintain call bell within reach at all times, provide assistance with transfer out of bed and ambulation.  Medication Inspection Compliance: Pill count conducted under aseptic conditions, in front of the patient. Neither the pills nor the bottle was removed from the patient's sight at any time. Once count was completed pills were immediately returned to the patient in their original bottle.  Medication: Hydrocodone/APAP Pill/Patch Count:  32 1/2 of 90 pills remain Pill/Patch Appearance: Markings consistent with prescribed medication Bottle Appearance: Standard pharmacy container. Clearly labeled. Filled Date: 87 / 28 / 2022 Last Medication intake:  TodaySafety precautions to be maintained throughout the outpatient stay will include: orient to surroundings, keep bed in low position, maintain call bell within reach at all times,  provide assistance with  transfer out of bed and ambulation.    UDS:  Summary  Date Value Ref Range Status  04/01/2020 Note  Final    Comment:    ==================================================================== Compliance Drug Analysis, Ur ==================================================================== Test                             Result       Flag       Units  Drug Present and Declared for Prescription Verification   Lorazepam                      54           EXPECTED   ng/mg creat    Source of lorazepam is a scheduled prescription medication.    Hydrocodone                    913          EXPECTED   ng/mg creat   Hydromorphone                  698          EXPECTED   ng/mg creat   Dihydrocodeine                 69           EXPECTED   ng/mg creat   Norhydrocodone                 1562         EXPECTED   ng/mg creat    Sources of hydrocodone include scheduled prescription medications.    Hydromorphone, dihydrocodeine and norhydrocodone are expected    metabolites of hydrocodone. Hydromorphone and dihydrocodeine are    also available as scheduled prescription medications.    Gabapentin                     PRESENT      EXPECTED   Orphenadrine                   PRESENT      EXPECTED   Zolpidem                       PRESENT      EXPECTED   Zolpidem Acid                  PRESENT      EXPECTED    Zolpidem acid is an expected metabolite of zolpidem.    Acetaminophen                  PRESENT      EXPECTED  Drug Absent but Declared for Prescription Verification   Cyclobenzaprine                Not Detected UNEXPECTED   Nortriptyline                  Not Detected UNEXPECTED ==================================================================== Test                      Result    Flag   Units      Ref Range   Creatinine              128  mg/dL      >=20 ==================================================================== Declared Medications:  The flagging and interpretation on this  report are based on the  following declared medications.  Unexpected results may arise from  inaccuracies in the declared medications.   **Note: The testing scope of this panel includes these medications:   Cyclobenzaprine (Flexeril)  Gabapentin (Neurontin)  Hydrocodone (Norco)  Lorazepam (Ativan)  Nortriptyline (Pamelor)  Orphenadrine (Norflex)   **Note: The testing scope of this panel does not include small to  moderate amounts of these reported medications:   Acetaminophen (Norco)  Zolpidem (Ambien)   **Note: The testing scope of this panel does not include the  following reported medications:   Benazepril  Biotin  Hydrochlorothiazide  Loratadine  Multivitamin  Supplement ==================================================================== For clinical consultation, please call [phone]. ====================================================================      ROS  Constitutional: wnl Gastrointestinal: No reported hemesis, hematochezia, vomiting, or acute GI distress Musculoskeletal: Denies any acute onset joint swelling, redness, loss of ROM, or weakness Neurological: No reported episodes of acute onset apraxia, aphasia, dysarthria, agnosia, amnesia, paralysis, loss of coordination, or loss of consciousness  Medication Review  Biotin, Calcium Polycarbophil, HYDROcodone-acetaminophen, Loratadine, Multi-Vitamins, benazepril-hydrochlorthiazide, cyclobenzaprine, diclofenac Sodium, docusate sodium, gabapentin, and zolpidem  History Review  Allergy: Perry Jones is allergic to prednisone. Drug: Perry Jones  reports no history of drug use. Alcohol:  reports current alcohol use. Tobacco:  reports that he quit smoking about 36 years ago. His smoking use included cigarettes. He has a 36.00 pack-year smoking history. He has never used smokeless tobacco. Social: Perry Jones  reports that he quit smoking about 36 years ago. His smoking use included cigarettes. He has a 36.00  pack-year smoking history. He has never used smokeless tobacco. He reports current alcohol use. He reports that he does not use drugs. Medical:  has a past medical history of Dysplastic nevus (03/11/2020), Dysplastic nevus (03/12/2019), High cholesterol, Hypertension, and Sinus trouble. Surgical: Mr. Riemenschneider  has a past surgical history that includes Knee arthroscopy (Right); Colonoscopy with propofol (N/A, 11/22/2017); Cholecystectomy; Lumbar fusion (N/A, 2000); Skin cancer excision; Replacement total knee (Right, 2007); Spinal cord stimulator insertion (N/A, 02/21/2021); and Spinal cord stimulator implant (02/21/2021). Family: family history includes Dementia in his mother; Heart Problems in his mother; Lung cancer in his father.  Laboratory Chemistry Profile   Renal Lab Results  Component Value Date   BUN 12 02/10/2021   CREATININE 0.72 02/10/2021   GFRAA >60 07/14/2018   GFRNONAA >60 02/10/2021    Hepatic Lab Results  Component Value Date   AST 45 (H) 09/28/2020   ALT 37 09/28/2020   ALBUMIN 4.5 09/28/2020   ALKPHOS 44 09/28/2020   LIPASE 28 07/14/2018    Electrolytes Lab Results  Component Value Date   NA 139 02/10/2021   K 3.5 02/10/2021   CL 105 02/10/2021   CALCIUM 9.6 02/10/2021    Bone Lab Results  Component Value Date   TESTOSTERONE 45 (L) 09/28/2020    Inflammation (CRP: Acute Phase) (ESR: Chronic Phase) No results found for: CRP, ESRSEDRATE, LATICACIDVEN       Note: Above Lab results reviewed.  Recent Imaging Review  DG Thoracic Spine 2 View CLINICAL DATA:  Spinal stimulator placement  EXAM: THORACIC SPINE 2 VIEWS  COMPARISON:  None.  FLUOROSCOPY TIME:  Radiation Exposure Index (as provided by the fluoroscopic device): Not available  If the device does not provide the exposure index:  Fluoroscopy Time:  5 minutes 4 seconds  Number of Acquired Images:  2  FINDINGS: Spinal stimulator leads are noted spanning from T8-T10 levels. No other focal  abnormality is noted.  IMPRESSION: Spinal stimulator placement in the mid to lower thoracic spine.  Electronically Signed   By: Inez Catalina M.D.   On: 02/21/2021 09:23 DG C-Arm 1-60 Min-No Report Fluoroscopy was utilized by the requesting physician.  No radiographic  interpretation.  DG C-Arm 1-60 Min-No Report Fluoroscopy was utilized by the requesting physician.  No radiographic  interpretation.   Note: Reviewed        Physical Exam  General appearance: Well nourished, well developed, and well hydrated. In no apparent acute distress Mental status: Alert, oriented x 3 (person, place, & time)       Respiratory: No evidence of acute respiratory distress Eyes: PERLA Vitals: BP (!) 141/82    Pulse 72    Temp (!) 97.1 F (36.2 C)    Ht 6' (1.829 m)    Wt 232 lb (105.2 kg)    SpO2 100%    BMI 31.46 kg/m  BMI: Estimated body mass index is 31.46 kg/m as calculated from the following:   Height as of this encounter: 6' (1.829 m).   Weight as of this encounter: 232 lb (105.2 kg). Ideal: Ideal body weight: 77.6 kg (171 lb 1.2 oz) Adjusted ideal body weight: 88.7 kg (195 lb 7.1 oz)   Lumbar Spine Area Exam  Skin & Axial Inspection: Well healed scar from previous spine surgery detected Alignment: Symmetrical Functional ROM: Pain restricted ROM       Stability: No instability detected Muscle Tone/Strength: Functionally intact. No obvious neuro-muscular anomalies detected. Sensory (Neurological): Dermatomal pain pattern  5 out of 5 strength bilateral lower extremity: Plantar flexion, dorsiflexion, knee flexion, knee extension.   Assessment   Status Diagnosis  Controlled Controlled Controlled 1. Chronic pain syndrome   2. Pharmacologic therapy   3. Failed back surgical syndrome   4. History of lumbar fusion (L3-S1)   5. Post laminectomy syndrome   6. Chronic radicular lumbar pain   7. Chronic use of opiate for therapeutic purpose   8. Thoracic spine pain   9. Lumbar facet  arthropathy      Plan of Care  Problem-specific:  No problem-specific Assessment & Plan notes found for this encounter.   Perry Jones has a current medication list which includes the following long-term medication(s): benazepril-hydrochlorthiazide, gabapentin, loratadine, zolpidem, [START ON 07/22/2021] hydrocodone-acetaminophen, [START ON 08/21/2021] hydrocodone-acetaminophen, and [START ON 09/20/2021] hydrocodone-acetaminophen.  Pharmacotherapy (Medications Ordered): Meds ordered this encounter  Medications   HYDROcodone-acetaminophen (NORCO) 10-325 MG tablet    Sig: Take 1 tablet by mouth every 8 (eight) hours as needed for severe pain. Must last 30 days.    Dispense:  90 tablet    Refill:  0    Chronic Pain: STOP Act (Not applicable) Fill 1 day early if closed on refill date. Avoid benzodiazepines within 8 hours of opioids   HYDROcodone-acetaminophen (NORCO) 10-325 MG tablet    Sig: Take 1 tablet by mouth every 8 (eight) hours as needed for severe pain. Must last 30 days.    Dispense:  90 tablet    Refill:  0    Chronic Pain: STOP Act (Not applicable) Fill 1 day early if closed on refill date. Avoid benzodiazepines within 8 hours of opioids   HYDROcodone-acetaminophen (NORCO) 10-325 MG tablet    Sig: Take 1 tablet by mouth every 8 (eight) hours as needed for severe pain. Must last 30 days.    Dispense:  90 tablet    Refill:  0    Chronic Pain: STOP Act (Not applicable) Fill 1 day early if closed on refill date. Avoid benzodiazepines within 8 hours of opioids   Work with [employer] to optimize spinal cord stimulator trial settings.  Follow-up plan:   Return in about 3 months (around 10/12/2021) for Medication Management, in person.        Recent Visits Date Type Provider Dept  04/20/21 Office Visit Gillis Santa, MD [facility]  Showing recent visits within past 90 days and meeting all other requirements Today's Visits Date Type Provider Dept  07/14/21  Office Visit Gillis Santa, MD [facility]  Showing today's visits and meeting all other requirements Future Appointments No visits were found meeting these conditions. Showing future appointments within next 90 days and meeting all other requirements  I discussed the assessment and treatment plan with the patient. The patient was provided an opportunity to ask questions and all were answered. The patient agreed with the plan and demonstrated an understanding of the instructions.  Patient advised to call back or seek an in-person evaluation if the symptoms or condition worsens.  Duration of encounter: 45mnutes.  Note by: BGillis Santa MD Date: 07/14/2021; Time: 11:12 AM

## 2021-07-26 ENCOUNTER — Telehealth: Payer: Self-pay | Admitting: Student in an Organized Health Care Education/Training Program

## 2021-07-26 ENCOUNTER — Encounter: Payer: Self-pay | Admitting: Student in an Organized Health Care Education/Training Program

## 2021-07-26 NOTE — Telephone Encounter (Signed)
Called [facility], confirmed that there are 3 scripts for Hydrocodone. Patient notified.

## 2021-10-04 ENCOUNTER — Encounter: Payer: Self-pay | Admitting: Student in an Organized Health Care Education/Training Program

## 2021-10-04 ENCOUNTER — Ambulatory Visit
Payer: Medicare HMO | Attending: Student in an Organized Health Care Education/Training Program | Admitting: Student in an Organized Health Care Education/Training Program

## 2021-10-04 DIAGNOSIS — Z79899 Other long term (current) drug therapy: Secondary | ICD-10-CM | POA: Insufficient documentation

## 2021-10-04 DIAGNOSIS — M5416 Radiculopathy, lumbar region: Secondary | ICD-10-CM | POA: Insufficient documentation

## 2021-10-04 DIAGNOSIS — G8929 Other chronic pain: Secondary | ICD-10-CM | POA: Insufficient documentation

## 2021-10-04 DIAGNOSIS — Z79891 Long term (current) use of opiate analgesic: Secondary | ICD-10-CM | POA: Diagnosis present

## 2021-10-04 DIAGNOSIS — G894 Chronic pain syndrome: Secondary | ICD-10-CM | POA: Diagnosis present

## 2021-10-04 MED ORDER — HYDROCODONE-ACETAMINOPHEN 10-325 MG PO TABS: 1.0000 | ORAL_TABLET | Freq: Two times a day (BID) | ORAL | 0 refills | Status: DC | PRN

## 2021-10-04 NOTE — Progress Notes (Signed)
Nursing Pain Medication Assessment:  ?Safety precautions to be maintained throughout the outpatient stay will include: orient to surroundings, keep bed in low position, maintain call bell within reach at all times, provide assistance with transfer out of bed and ambulation.  ?Medication Inspection Compliance: Pill count conducted under aseptic conditions, in front of the patient. Neither the pills nor the bottle was removed from the patient's sight at any time. Once count was completed pills were immediately returned to the patient in their original bottle. ? ?Medication: Hydrocodone/APAP ?Pill/Patch Count:  91 of 90 pills remain ?Pill/Patch Appearance: Markings consistent with prescribed medication ?Bottle Appearance: Standard pharmacy container. Clearly labeled. ?Filled Date: 4 / 7 / 2023 ?Last Medication intake:  TodaySafety precautions to be maintained throughout the outpatient stay will include: orient to surroundings, keep bed in low position, maintain call bell within reach at all times, provide assistance with transfer out of bed and ambulation.  ?

## 2021-10-04 NOTE — Progress Notes (Signed)
PROVIDER NOTE: Information contained herein reflects review and annotations entered in association with encounter. Interpretation of such information and data should be left to medically-trained personnel. Information provided to patient can be located elsewhere in the medical record under "Patient Instructions". Document created using STT-dictation technology, any transcriptional errors that may result from process are unintentional.  ?  ?Patient: Perry Jones  Service Category: E/M  Provider: Gillis Santa, MD  ?DOB: 1950/12/10  DOS: 10/04/2021  Specialty: Interventional Pain Management  ?MRN: [id number]  Setting: Ambulatory outpatient  PCP: Perry Pitch, MD  ?Type: Established Patient    Referring Provider: Juluis Pitch, MD  ?Location: Office  Delivery: Face-to-face    ? ?HPI  ?[name], a 71 y.o. year old male, is here today because of his No primary diagnosis found.. [name] primary complain today is Back Pain (lower) ? ?Last encounter: My last encounter with him was on 07/14/21 ?Pertinent problems: [name] has Back muscle spasm; Chronic knee pain after total replacement of right knee joint; DDD (degenerative disc disease), lumbar; Essential hypertension; Chronic radicular lumbar pain; History of total right knee replacement; History of lumbar fusion (L1-L3); Chronic bilateral low back pain with bilateral sciatica; Chronic pain syndrome; Lumbar facet arthropathy; and Piriformis syndrome, right on their pertinent problem list. ?Pain Assessment: Severity of Chronic pain is reported as a 4 /10. Location: Back (right hip) Right/Pain radiaties down both leg to his foot at times. Onset: More than a month ago. Quality: Shooting, Aching, Constant. Timing: Constant. Modifying factor(s): Meds, spinal cord stimultor. ?Vitals:  height is 6' (1.829 m) and weight is 230 lb (104.3 kg). His temperature is 98.2 ?F (36.8 ?C). His blood pressure is 122/71 and his pulse is 88. His oxygen saturation is  99%.  ? ?Reason for encounter: medication management. ? ?Patient has been working with [employer] to optimize his spinal cord stimulator program settings.  He states that he has finally found a couple of programs that worked very well for him during the day and at night respectively to help manage his pain.  He is utilizing less hydrocodone as a result of the improved pain relief he is experiencing from spinal cord stimulation.  We will reduce his hydrocodone down to twice daily from 3 times daily given improvement in his overall pain with spinal cord stimulation.  We will also renew his annual urine toxicology screen today. ? ? ?Pharmacotherapy Assessment  ?Analgesic: hydrocodone 10 mg, 2 times daily as needed   ? ?Monitoring: ?Perry Jones PMP: PDMP reviewed during this encounter.       ?Pharmacotherapy: No side-effects or adverse reactions reported. ?Compliance: No problems identified. ?Effectiveness: Clinically acceptable. ? ?Perry Fischer, RN  10/04/2021  1:52 PM  Sign when Signing Visit ?Nursing Pain Medication Assessment:  ?Safety precautions to be maintained throughout the outpatient stay will include: orient to surroundings, keep bed in low position, maintain call bell within reach at all times, provide assistance with transfer out of bed and ambulation.  ?Medication Inspection Compliance: Pill count conducted under aseptic conditions, in front of the patient. Neither the pills nor the bottle was removed from the patient's sight at any time. Once count was completed pills were immediately returned to the patient in their original bottle. ? ?Medication: Hydrocodone/APAP ?Pill/Patch Count:  91 of 90 pills remain ?Pill/Patch Appearance: Markings consistent with prescribed medication ?Bottle Appearance: Standard pharmacy container. Clearly labeled. ?Filled Date: 4 / 7 / 2023 ?Last Medication intake:  TodaySafety precautions to be maintained throughout  the outpatient stay will include: orient to surroundings, keep  bed in low position, maintain call bell within reach at all times, provide assistance with transfer out of bed and ambulation.  ?  UDS:  ?Summary  ?Date Value Ref Range Status  ?04/01/2020 Note  Final  ?  Comment:  ?  ==================================================================== ?Compliance Drug Analysis, Ur ?==================================================================== ?Test                             Result       Flag       Units ? ?Drug Present and Declared for Prescription Verification ?  Lorazepam                      54           EXPECTED   ng/mg creat ?   Source of lorazepam is a scheduled prescription medication. ? ?  Hydrocodone                    913          EXPECTED   ng/mg creat ?  Hydromorphone                  698          EXPECTED   ng/mg creat ?  Dihydrocodeine                 69           EXPECTED   ng/mg creat ?  Norhydrocodone                 1562         EXPECTED   ng/mg creat ?   Sources of hydrocodone include scheduled prescription medications. ?   Hydromorphone, dihydrocodeine and norhydrocodone are expected ?   metabolites of hydrocodone. Hydromorphone and dihydrocodeine are ?   also available as scheduled prescription medications. ? ?  Gabapentin                     PRESENT      EXPECTED ?  Orphenadrine                   PRESENT      EXPECTED ?  Zolpidem                       PRESENT      EXPECTED ?  Zolpidem Acid                  PRESENT      EXPECTED ?   Zolpidem acid is an expected metabolite of zolpidem. ? ?  Acetaminophen                  PRESENT      EXPECTED ? ?Drug Absent but Declared for Prescription Verification ?  Cyclobenzaprine                Not Detected UNEXPECTED ?  Nortriptyline                  Not Detected UNEXPECTED ?==================================================================== ?Test                      Result    Flag   Units      Ref Range ?  Creatinine  128              mg/dL       >=20 ?==================================================================== ?Declared Medications: ? The flagging and interpretation on this report are based on the ? following declared medications.  Unexpected results may arise from ? inaccuracies in the declared medications. ? ? **Note: The testing scope of this panel includes these medications: ? ? Cyclobenzaprine (Flexeril) ? Gabapentin (Neurontin) ? Hydrocodone (Norco) ? Lorazepam (Ativan) ? Nortriptyline (Pamelor) ? Orphenadrine (Norflex) ? ? **Note: The testing scope of this panel does not include small to ? moderate amounts of these reported medications: ? ? Acetaminophen (Norco) ? Zolpidem (Ambien) ? ? **Note: The testing scope of this panel does not include the ? following reported medications: ? ? Benazepril ? Biotin ? Hydrochlorothiazide ? Loratadine ? Multivitamin ? Supplement ?==================================================================== ?For clinical consultation, please call [phone]. ?==================================================================== ?  ?  ? ?ROS  ?Constitutional: wnl ?Gastrointestinal: No reported hemesis, hematochezia, vomiting, or acute GI distress ?Musculoskeletal: Denies any acute onset joint swelling, redness, loss of ROM, or weakness ?Neurological: No reported episodes of acute onset apraxia, aphasia, dysarthria, agnosia, amnesia, paralysis, loss of coordination, or loss of consciousness ? ?Medication Review  ?Biotin, Calcium Polycarbophil, HYDROcodone-acetaminophen, Loratadine, Multi-Vitamins, benazepril-hydrochlorthiazide, cyclobenzaprine, diclofenac Sodium, docusate sodium, gabapentin, and zolpidem ? ?History Review  ?Allergy: Mr. Mcglamery is allergic to prednisone. ?Drug: Mr. Desa  reports no history of drug use. ?Alcohol:  reports current alcohol use. ?Tobacco:  reports that he quit smoking about 36 years ago. His smoking use included cigarettes. He has a 36.00 pack-year smoking history. He has never used  smokeless tobacco. ?Social: Mr. Wilmeth  reports that he quit smoking about 36 years ago. His smoking use included cigarettes. He has a 36.00 pack-year smoking history. He has never used smokeless tobacco. He reports current alcohol use. He reports that he does

## 2021-10-09 LAB — TOXASSURE SELECT 13 (MW), URINE

## 2021-10-11 ENCOUNTER — Encounter: Payer: Medicare HMO | Admitting: Student in an Organized Health Care Education/Training Program

## 2021-10-13 ENCOUNTER — Encounter: Payer: Medicare HMO | Admitting: Student in an Organized Health Care Education/Training Program

## 2021-11-07 ENCOUNTER — Telehealth: Payer: Self-pay | Admitting: Student in an Organized Health Care Education/Training Program

## 2021-11-07 DIAGNOSIS — Z79899 Other long term (current) drug therapy: Secondary | ICD-10-CM

## 2021-11-07 DIAGNOSIS — G8929 Other chronic pain: Secondary | ICD-10-CM

## 2021-11-07 DIAGNOSIS — G894 Chronic pain syndrome: Secondary | ICD-10-CM

## 2021-11-07 DIAGNOSIS — Z79891 Long term (current) use of opiate analgesic: Secondary | ICD-10-CM

## 2021-11-07 MED ORDER — HYDROCODONE-ACETAMINOPHEN 10-325 MG PO TABS: 1.0000 | ORAL_TABLET | Freq: Two times a day (BID) | ORAL | 0 refills | Status: DC | PRN

## 2021-11-07 NOTE — Telephone Encounter (Signed)
Called pharm to have them cancel  previous rx and to keep the ones sent for today. Pharm with understanding ? ?

## 2021-11-07 NOTE — Telephone Encounter (Signed)
Called Perry Jones to get clarification. He states that the decision was made on October 04, 2021 appointment to decrease his medication to 60 tablets a day. He was calling to state that the dates were wrong on his prescriptions. States that it needs to be filled 11/07/2021. He also stated that only 2 months were sent in and that usually you do three.  I fill like this is because he already had one month to be filled. Can you correct this? I will be glad to call him back. ?

## 2021-11-07 NOTE — Telephone Encounter (Signed)
Patient states his scripts at [facility] do not have correct dates and should be for 60 not  90? Please check on this and let patient know status ?

## 2021-11-07 NOTE — Telephone Encounter (Signed)
Called and informed patient of the change. ?

## 2022-01-03 ENCOUNTER — Ambulatory Visit
Payer: Medicare HMO | Attending: Student in an Organized Health Care Education/Training Program | Admitting: Student in an Organized Health Care Education/Training Program

## 2022-01-03 ENCOUNTER — Encounter: Payer: Self-pay | Admitting: Student in an Organized Health Care Education/Training Program

## 2022-01-03 VITALS — BP 147/82 | HR 77 | Temp 97.0°F | Resp 16 | Ht 72.0 in | Wt 227.0 lb

## 2022-01-03 DIAGNOSIS — M5416 Radiculopathy, lumbar region: Secondary | ICD-10-CM | POA: Diagnosis present

## 2022-01-03 DIAGNOSIS — G8929 Other chronic pain: Secondary | ICD-10-CM | POA: Diagnosis present

## 2022-01-03 DIAGNOSIS — M961 Postlaminectomy syndrome, not elsewhere classified: Secondary | ICD-10-CM | POA: Insufficient documentation

## 2022-01-03 DIAGNOSIS — Z981 Arthrodesis status: Secondary | ICD-10-CM | POA: Insufficient documentation

## 2022-01-03 DIAGNOSIS — G894 Chronic pain syndrome: Secondary | ICD-10-CM | POA: Diagnosis present

## 2022-01-03 DIAGNOSIS — Z79899 Other long term (current) drug therapy: Secondary | ICD-10-CM | POA: Diagnosis present

## 2022-01-03 DIAGNOSIS — Z79891 Long term (current) use of opiate analgesic: Secondary | ICD-10-CM | POA: Insufficient documentation

## 2022-01-03 DIAGNOSIS — M546 Pain in thoracic spine: Secondary | ICD-10-CM | POA: Insufficient documentation

## 2022-01-03 MED ORDER — HYDROCODONE-ACETAMINOPHEN 10-325 MG PO TABS: 1.0000 | ORAL_TABLET | Freq: Two times a day (BID) | ORAL | 0 refills | Status: DC | PRN

## 2022-01-03 NOTE — Progress Notes (Signed)
Nursing Pain Medication Assessment:  Safety precautions to be maintained throughout the outpatient stay will include: orient to surroundings, keep bed in low position, maintain call bell within reach at all times, provide assistance with transfer out of bed and ambulation.  Medication Inspection Compliance: Pill count conducted under aseptic conditions, in front of the patient. Neither the pills nor the bottle was removed from the patient's sight at any time. Once count was completed pills were immediately returned to the patient in their original bottle.  Medication: Hydrocodone/APAP Pill/Patch Count:  22 of 60 pills remain Pill/Patch Appearance: Markings consistent with prescribed medication Bottle Appearance: Standard pharmacy container. Clearly labeled. Filled Date: 06 / 22 / 2023 Last Medication intake:  Yesterday

## 2022-01-03 NOTE — Progress Notes (Signed)
PROVIDER NOTE: Information contained herein reflects review and annotations entered in association with encounter. Interpretation of such information and data should be left to medically-trained personnel. Information provided to patient can be located elsewhere in the medical record under "Patient Instructions". Document created using STT-dictation technology, any transcriptional errors that may result from process are unintentional.    Patient: Perry Jones  Service Category: E/M  Provider: Gillis Santa, MD  DOB: January 08, 1951  DOS: 01/03/2022  Specialty: Interventional Pain Management  MRN: [id number]  Setting: Ambulatory outpatient  PCP: Juluis Pitch, MD  Type: Established Patient    Referring Provider: Juluis Pitch, MD  Location: Office  Delivery: Face-to-face     HPI  Mr. Davell Beckstead, a 71 y.o. year old male, is here today because of his Chronic pain syndrome [G89.4]. Mr. Mangino primary complain today is Leg Pain (Right knee to large toe)  Last encounter: My last encounter with him was on 10/04/21  Pertinent problems: Mr. Cafarelli has Back muscle spasm; Chronic knee pain after total replacement of right knee joint; DDD (degenerative disc disease), lumbar; Essential hypertension; Chronic radicular lumbar pain; History of total right knee replacement; History of lumbar fusion (L1-L3); Chronic bilateral low back pain with bilateral sciatica; Chronic pain syndrome; Lumbar facet arthropathy; and Piriformis syndrome, right on their pertinent problem list. Pain Assessment: Severity of Chronic pain is reported as a 6 /10. Location: Knee Right/right knee to large toe. Onset: More than a month ago. Quality: Hervey Ard, Shooting. Timing: Constant. Modifying factor(s): SCS, meds. Vitals:  height is 6' (1.829 m) and weight is 227 lb (103 kg). His temporal temperature is 97 F (36.1 C) (abnormal). His blood pressure is 147/82 (abnormal) and his pulse is 77. His respiration is 16 and oxygen saturation is  96%.   Reason for encounter: medication management.  Patient has is meeting with [employer] to optimize his spinal cord stimulator program settings.  He is obtaining satisfactory benefit from his spinal cord stimulator.  He continues with hydrocodone as needed twice a day. We will refill as below.   Pharmacotherapy Assessment  Analgesic: hydrocodone 10 mg, 2 times daily as needed    Monitoring: [place] PMP: PDMP reviewed during this encounter.       Pharmacotherapy: No side-effects or adverse reactions reported. Compliance: No problems identified. Effectiveness: Clinically acceptable.  Rise Patience, RN  01/03/2022  1:25 PM  Sign when Signing Visit Nursing Pain Medication Assessment:  Safety precautions to be maintained throughout the outpatient stay will include: orient to surroundings, keep bed in low position, maintain call bell within reach at all times, provide assistance with transfer out of bed and ambulation.  Medication Inspection Compliance: Pill count conducted under aseptic conditions, in front of the patient. Neither the pills nor the bottle was removed from the patient's sight at any time. Once count was completed pills were immediately returned to the patient in their original bottle.  Medication: Hydrocodone/APAP Pill/Patch Count:  22 of 60 pills remain Pill/Patch Appearance: Markings consistent with prescribed medication Bottle Appearance: Standard pharmacy container. Clearly labeled. Filled Date: 06 / 22 / 2023 Last Medication intake:  Yesterday   UDS:  Summary  Date Value Ref Range Status  10/04/2021 Note  Final    Comment:    ==================================================================== [employer] [facility] (MW) ==================================================================== Test                             Result  Flag       Units  Drug Present and Declared for Prescription Verification   Hydrocodone                    1019          EXPECTED   ng/mg creat   Hydromorphone                  557          EXPECTED   ng/mg creat   Dihydrocodeine                 80           EXPECTED   ng/mg creat   Norhydrocodone                 1407         EXPECTED   ng/mg creat    Sources of hydrocodone include scheduled prescription medications.    Hydromorphone, dihydrocodeine and norhydrocodone are expected    metabolites of hydrocodone. Hydromorphone and dihydrocodeine are    also available as scheduled prescription medications.  Drug Present not Declared for Prescription Verification   Lorazepam                      107          UNEXPECTED ng/mg creat    Source of lorazepam is a scheduled prescription medication.  ==================================================================== Test                      Result    Flag   Units      Ref Range   Creatinine              84               mg/dL      >=20 ==================================================================== Declared Medications:  The flagging and interpretation on this report are based on the  following declared medications.  Unexpected results may arise from  inaccuracies in the declared medications.   **Note: The testing scope of this panel includes these medications:   Hydrocodone (Norco)   **Note: The testing scope of this panel does not include the  following reported medications:   Acetaminophen (Norco)  Benazepril  Biotin  Calcium  Cyclobenzaprine (Flexeril)  Diclofenac (Voltaren)  Docusate (Colace)  Gabapentin (Neurontin)  Hydrochlorothiazide  Loratadine  Multivitamin  Zolpidem (Ambien) ==================================================================== For clinical consultation, please call [phone]. ====================================================================      ROS  Constitutional: wnl Gastrointestinal: No reported hemesis, hematochezia, vomiting, or acute GI distress Musculoskeletal: Denies any acute onset joint  swelling, redness, loss of ROM, or weakness Neurological: No reported episodes of acute onset apraxia, aphasia, dysarthria, agnosia, amnesia, paralysis, loss of coordination, or loss of consciousness  Medication Review  Biotin, Calcium Polycarbophil, HYDROcodone-acetaminophen, Loratadine, Multi-Vitamins, benazepril-hydrochlorthiazide, cyclobenzaprine, diclofenac Sodium, docusate sodium, gabapentin, and zolpidem  History Review  Allergy: Mr. Wenig is allergic to prednisone. Drug: Mr. Evitt  reports no history of drug use. Alcohol:  reports current alcohol use. Tobacco:  reports that he quit smoking about 36 years ago. His smoking use included cigarettes. He has a 36.00 pack-year smoking history. He has never used smokeless tobacco. Social: Mr. Ausborn  reports that he quit smoking about 36 years ago. His smoking use included cigarettes. He has a 36.00 pack-year smoking history. He has never used smokeless tobacco. He reports current alcohol use. He reports that he does  not use drugs. Medical:  has a past medical history of Dysplastic nevus (03/11/2020), Dysplastic nevus (03/12/2019), High cholesterol, Hypertension, and Sinus trouble. Surgical: Mr. Hollern  has a past surgical history that includes Knee arthroscopy (Right); Colonoscopy with propofol (N/A, 11/22/2017); Cholecystectomy; Lumbar fusion (N/A, 2000); Skin cancer excision; Replacement total knee (Right, 2007); Spinal cord stimulator insertion (N/A, 02/21/2021); and Spinal cord stimulator implant (02/21/2021). Family: family history includes Dementia in his mother; Heart Problems in his mother; Lung cancer in his father.  Laboratory Chemistry Profile   Renal Lab Results  Component Value Date   BUN 12 02/10/2021   CREATININE 0.72 02/10/2021   GFRAA >60 07/14/2018   GFRNONAA >60 02/10/2021    Hepatic Lab Results  Component Value Date   AST 45 (H) 09/28/2020   ALT 37 09/28/2020   ALBUMIN 4.5 09/28/2020   ALKPHOS 44 09/28/2020   LIPASE  28 07/14/2018    Electrolytes Lab Results  Component Value Date   NA 139 02/10/2021   K 3.5 02/10/2021   CL 105 02/10/2021   CALCIUM 9.6 02/10/2021    Bone Lab Results  Component Value Date   TESTOSTERONE 45 (L) 09/28/2020    Inflammation (CRP: Acute Phase) (ESR: Chronic Phase) No results found for: "CRP", "ESRSEDRATE", "LATICACIDVEN"       Note: Above Lab results reviewed.  Recent Imaging Review  DG Thoracic Spine 2 View CLINICAL DATA:  Spinal stimulator placement  EXAM: THORACIC SPINE 2 VIEWS  COMPARISON:  None.  FLUOROSCOPY TIME:  Radiation Exposure Index (as provided by the fluoroscopic device): Not available  If the device does not provide the exposure index:  Fluoroscopy Time:  5 minutes 4 seconds  Number of Acquired Images:  2  FINDINGS: Spinal stimulator leads are noted spanning from T8-T10 levels. No other focal abnormality is noted.  IMPRESSION: Spinal stimulator placement in the mid to lower thoracic spine.  Electronically Signed   By: Inez Catalina M.D.   On: 02/21/2021 09:23 DG C-Arm 1-60 Min-No Report Fluoroscopy was utilized by the requesting physician.  No radiographic  interpretation.  DG C-Arm 1-60 Min-No Report Fluoroscopy was utilized by the requesting physician.  No radiographic  interpretation.   Note: Reviewed        Physical Exam  General appearance: Well nourished, well developed, and well hydrated. In no apparent acute distress Mental status: Alert, oriented x 3 (person, place, & time)       Respiratory: No evidence of acute respiratory distress Eyes: PERLA Vitals: BP (!) 147/82   Pulse 77   Temp (!) 97 F (36.1 C) (Temporal)   Resp 16   Ht 6' (1.829 m)   Wt 227 lb (103 kg)   SpO2 96%   BMI 30.79 kg/m  BMI: Estimated body mass index is 30.79 kg/m as calculated from the following:   Height as of this encounter: 6' (1.829 m).   Weight as of this encounter: 227 lb (103 kg). Ideal: Ideal body weight: 77.6 kg (171 lb 1.2  oz) Adjusted ideal body weight: 87.7 kg (193 lb 7.1 oz)   Lumbar Spine Area Exam  Skin & Axial Inspection: Well healed scar from previous spine surgery detected Alignment: Symmetrical Functional ROM: Pain restricted ROM      improved from before after optimization of spinal cord stimulator settings Stability: No instability detected Muscle Tone/Strength: Functionally intact. No obvious neuro-muscular anomalies detected. Sensory (Neurological): Dermatomal pain pattern  5 out of 5 strength bilateral lower extremity: Plantar flexion, dorsiflexion, knee flexion, knee  extension.   Assessment   Status Diagnosis  Controlled Controlled Controlled 1. Chronic pain syndrome   2. Chronic radicular lumbar pain   3. Chronic use of opiate for therapeutic purpose   4. Failed back surgical syndrome   5. History of lumbar fusion (L3-S1)   6. Post laminectomy syndrome   7. Thoracic spine pain   8. Pharmacologic therapy      Plan of Care     Mr. Hillel Card has a current medication list which includes the following long-term medication(s): benazepril-hydrochlorthiazide, gabapentin, loratadine, zolpidem, [START ON 01/14/2022] hydrocodone-acetaminophen, [START ON 02/13/2022] hydrocodone-acetaminophen, and [START ON 03/15/2022] hydrocodone-acetaminophen.  Pharmacotherapy (Medications Ordered): Meds ordered this encounter  Medications   HYDROcodone-acetaminophen (NORCO) 10-325 MG tablet    Sig: Take 1 tablet by mouth every 12 (twelve) hours as needed for severe pain. Must last 30 days.    Dispense:  60 tablet    Refill:  0    Chronic Pain: STOP Act (Not applicable) Fill 1 day early if closed on refill date. Avoid benzodiazepines within 8 hours of opioids   HYDROcodone-acetaminophen (NORCO) 10-325 MG tablet    Sig: Take 1 tablet by mouth every 12 (twelve) hours as needed for severe pain. Must last 30 days.    Dispense:  60 tablet    Refill:  0    Chronic Pain: STOP Act (Not applicable) Fill  1 day early if closed on refill date. Avoid benzodiazepines within 8 hours of opioids   HYDROcodone-acetaminophen (NORCO) 10-325 MG tablet    Sig: Take 1 tablet by mouth every 12 (twelve) hours as needed for severe pain. Must last 30 days.    Dispense:  60 tablet    Refill:  0    Chronic Pain: STOP Act (Not applicable) Fill 1 day early if closed on refill date. Avoid benzodiazepines within 8 hours of opioids   No orders of the defined types were placed in this encounter.  Continue to utilize spinal cord stimulator, meeting with spinal cord stimulator representative today to optimize settings.  Follow-up plan:   Return in about 3 months (around 04/05/2022) for Medication Management, in person.        Recent Visits No visits were found meeting these conditions. Showing recent visits within past 90 days and meeting all other requirements Today's Visits Date Type Provider Dept  01/03/22 Office Visit Gillis Santa, MD [facility]  Showing today's visits and meeting all other requirements Future Appointments No visits were found meeting these conditions. Showing future appointments within next 90 days and meeting all other requirements  I discussed the assessment and treatment plan with the patient. The patient was provided an opportunity to ask questions and all were answered. The patient agreed with the plan and demonstrated an understanding of the instructions.  Patient advised to call back or seek an in-person evaluation if the symptoms or condition worsens.  Duration of encounter: 77mnutes.  Note by: BGillis Santa MD Date: 01/03/2022; Time: 1:42 PM

## 2022-03-23 ENCOUNTER — Ambulatory Visit: Payer: Medicare HMO | Admitting: Dermatology

## 2022-03-23 ENCOUNTER — Encounter: Payer: Self-pay | Admitting: Dermatology

## 2022-03-23 DIAGNOSIS — Z86018 Personal history of other benign neoplasm: Secondary | ICD-10-CM

## 2022-03-23 DIAGNOSIS — D1801 Hemangioma of skin and subcutaneous tissue: Secondary | ICD-10-CM

## 2022-03-23 DIAGNOSIS — D229 Melanocytic nevi, unspecified: Secondary | ICD-10-CM

## 2022-03-23 DIAGNOSIS — L219 Seborrheic dermatitis, unspecified: Secondary | ICD-10-CM | POA: Diagnosis not present

## 2022-03-23 DIAGNOSIS — L814 Other melanin hyperpigmentation: Secondary | ICD-10-CM

## 2022-03-23 DIAGNOSIS — L570 Actinic keratosis: Secondary | ICD-10-CM

## 2022-03-23 DIAGNOSIS — L821 Other seborrheic keratosis: Secondary | ICD-10-CM

## 2022-03-23 DIAGNOSIS — L578 Other skin changes due to chronic exposure to nonionizing radiation: Secondary | ICD-10-CM

## 2022-03-23 DIAGNOSIS — Z1283 Encounter for screening for malignant neoplasm of skin: Secondary | ICD-10-CM | POA: Diagnosis not present

## 2022-03-23 DIAGNOSIS — D239 Other benign neoplasm of skin, unspecified: Secondary | ICD-10-CM

## 2022-03-23 NOTE — Progress Notes (Signed)
Follow-Up Visit   Subjective  Perry Jones is a 71 y.o. male who presents for the following: Total body skin exam (Hx of Dysplastic nevi, upper back spine, L mid back 4.0cm lat to spine) and Seborrheic Dermatitis (Scalp, face, ears, not using Ketoconazole 2% shampoo). The patient presents for Total-Body Skin Exam (TBSE) for skin cancer screening and mole check.  The patient has spots, moles and lesions to be evaluated, some may be new or changing and the patient has concerns that these could be cancer.   The following portions of the chart were reviewed this encounter and updated as appropriate:   Tobacco  Allergies  Meds  Problems  Med Hx  Surg Hx  Fam Hx     Review of Systems:  No other skin or systemic complaints except as noted in HPI or Assessment and Plan.  Objective  Well appearing patient in no apparent distress; mood and affect are within normal limits.  A full examination was performed including scalp, head, eyes, ears, nose, lips, neck, chest, axillae, abdomen, back, buttocks, bilateral upper extremities, bilateral lower extremities, hands, feet, fingers, toes, fingernails, and toenails. All findings within normal limits unless otherwise noted below.  nose x 1 Pink scaly macules  face, scalp, ears Scaling eyebrows, scalp   Assessment & Plan   Lentigines - Scattered tan macules - Due to sun exposure - Benign-appearing, observe - Recommend daily broad spectrum sunscreen SPF 30+ to sun-exposed areas, reapply every 2 hours as needed. - Call for any changes - back  Seborrheic Keratoses - Stuck-on, waxy, tan-brown papules and/or plaques  - Benign-appearing - Discussed benign etiology and prognosis. - Observe - Call for any changes - arms, trunk  Melanocytic Nevi - Tan-brown and/or pink-flesh-colored symmetric macules and papules - Benign appearing on exam today - Observation - Call clinic for new or changing moles - Recommend daily use of broad spectrum  spf 30+ sunscreen to sun-exposed areas.  - trunk  Hemangiomas - Red papules - Discussed benign nature - Observe - Call for any changes - trunk  Actinic Damage - Chronic condition, secondary to cumulative UV/sun exposure - diffuse scaly erythematous macules with underlying dyspigmentation - Recommend daily broad spectrum sunscreen SPF 30+ to sun-exposed areas, reapply every 2 hours as needed.  - Staying in the shade or wearing long sleeves, sun glasses (UVA+UVB protection) and wide brim hats (4-inch brim around the entire circumference of the hat) are also recommended for sun protection.  - Call for new or changing lesions.  Skin cancer screening performed today.   History of Dysplastic Nevi - No evidence of recurrence today - Recommend regular full body skin exams - Recommend daily broad spectrum sunscreen SPF 30+ to sun-exposed areas, reapply every 2 hours as needed.  - Call if any new or changing lesions are noted between office visits  - upper back spine, L mid back 4.0cm lat to spine  AK (actinic keratosis) nose x 1  Destruction of lesion - nose x 1 Complexity: simple   Destruction method: cryotherapy   Informed consent: discussed and consent obtained   Timeout:  patient name, date of birth, surgical site, and procedure verified Lesion destroyed using liquid nitrogen: Yes   Region frozen until ice ball extended beyond lesion: Yes   Outcome: patient tolerated procedure well with no complications   Post-procedure details: wound care instructions given    Seborrheic dermatitis face, scalp, ears  Seborrheic Dermatitis  -  is a chronic persistent rash characterized by  pinkness and scaling most commonly of the mid face but also can occur on the scalp (dandruff), ears; mid chest, mid back and groin.  It tends to be exacerbated by stress and cooler weather.  People who have neurologic disease may experience new onset or exacerbation of existing seborrheic dermatitis.  The  condition is not curable but treatable and can be controlled.  Pt declines Ketoconazole 2% shampoo at this time, he may call if he wants to restart   Return in about 1 year (around 03/24/2023) for TBSE, Hx of AKs, Hx of Dysplastic nevi.  I, Othelia Pulling, RMA, am acting as scribe for Sarina Ser, MD . Documentation: I have reviewed the above documentation for accuracy and completeness, and I agree with the above.  Sarina Ser, MD

## 2022-03-23 NOTE — Patient Instructions (Addendum)
Cryotherapy Aftercare  Wash gently with soap and water everyday.   Apply Vaseline and Band-Aid daily until healed.   Actinic Keratosis  What is an actinic keratosis? An actinic keratosis (plural: actinic keratoses) is growth on the surface of the skin that usually appears as a red, hard, crusty or scaly bump.   What causes actinic keratoses? Repeated prolonged sun exposure causes skin damage, especially in fair-skinned persons. Sun-damaged skin becomes dry and wrinkled and may form rough, scaly spots called actinic keratoses. These rough spots remain on the skin even though the crust or scale on top is picked off.   Why treat actinic keratoses? Actinic keratoses are not skin cancers, but because they may sometimes turn cancerous they are called "pre-cancerous". Not all will turn to skin cancer, and it usually takes several years for this to happen. Because it is much easier to treat an actinic keratosis then it is to remove a skin cancer, actinic keratoses should be treated to prevent future skin cancer.   How are actinic keratoses treated? The most common way of treating actinic keratoses is to freeze them with liquid nitrogen. Freezing causes scabbing and shedding of the sun-damaged skin. Healing after a removal usually takes two weeks, depending on the size and location of the keratosis. Hands and legs heal more slowly than the face. The skin's final appearance is usually excellent. There are several topical medications that can be used to treat actinic keratoses. These medications generally have side effects of redness, crusting, and pain. Some are used for a few days, and some for several months before the actinic keratosis is completely gone. Photodynamic therapy is another alternative to freezing actinic keratoses. This treatment is done in a physician's office. A medication is applied to the area of skin with actinic keratoses, and it is allowed to soak in for one or more hours. A special  light is then applied to the skin. Side effects include redness, burning, and peeling.  How can you prevent actinic keratoses? Protection from the sun is the best way to prevent actinic keratoses. The use of proper clothing and sunscreens can prevent the sun damage that leads to an actinic keratosis.  Unfortunately, some sun damage is permanent. Once sun damage has progressed to the point where actinic keratoses develop, new keratoses may appear even without further sun exposure. However, even in skin that is already heavily sun damaged, good sun protection can help reduce the number of actinic keratoses that will appear.    Due to recent changes in healthcare laws, you may see results of your pathology and/or laboratory studies on MyChart before the doctors have had a chance to review them. We understand that in some cases there may be results that are confusing or concerning to you. Please understand that not all results are received at the same time and often the doctors may need to interpret multiple results in order to provide you with the best plan of care or course of treatment. Therefore, we ask that you please give [place] 2 business days to thoroughly review all your results before contacting the office for clarification. Should we see a critical lab result, you will be contacted sooner.   If You Need Anything After Your Visit  If you have any questions or concerns for your doctor, please call our main line at [phone] and press option 4 to reach your doctor's medical assistant. If no one answers, please leave a voicemail as directed and we will return your call  as soon as possible. Messages left after 4 pm will be answered the following business day.   You may also send [place] a message via [place]. We typically respond to MyChart messages within 1-2 business days.  For prescription refills, please ask your pharmacy to contact our office. Our fax number is [phone].  If you have an urgent  issue when the clinic is closed that cannot wait until the next business day, you can page your doctor at the number below.    Please note that while we do our best to be available for urgent issues outside of office hours, we are not available 24/7.   If you have an urgent issue and are unable to reach [place], you may choose to seek medical care at your doctor's office, retail clinic, urgent care center, or emergency room.  If you have a medical emergency, please immediately call 911 or go to the emergency department.  Pager Numbers  - Dr. Nehemiah Massed: [phone]  - Dr. Laurence Ferrari: [phone]  - Dr. Nicole Kindred: [phone]  In the event of inclement weather, please call our main line at [phone] for an update on the status of any delays or closures.  Dermatology Medication Tips: Please keep the boxes that topical medications come in in order to help keep track of the instructions about where and how to use these. Pharmacies typically print the medication instructions only on the boxes and not directly on the medication tubes.   If your medication is too expensive, please contact our office at [phone] option 4 or send [place] a message through [place].   We are unable to tell what your co-pay for medications will be in advance as this is different depending on your insurance coverage. However, we may be able to find a substitute medication at lower cost or fill out paperwork to get insurance to cover a needed medication.   If a prior authorization is required to get your medication covered by your insurance company, please allow [place] 1-2 business days to complete this process.  Drug prices often vary depending on where the prescription is filled and some pharmacies may offer cheaper prices.  The website www.goodrx.com contains coupons for medications through different pharmacies. The prices here do not account for what the cost may be with help from insurance (it may be cheaper with your  insurance), but the website can give you the price if you did not use any insurance.  - You can print the associated coupon and take it with your prescription to the pharmacy.  - You may also stop by our office during regular business hours and pick up a GoodRx coupon card.  - If you need your prescription sent electronically to a different pharmacy, notify our office through [facility] or by phone at [phone] option 4.     Si Usted Necesita Algo Despus de Su Visita  Tambin puede enviarnos un mensaje a travs de Pharmacist, community. Por lo general respondemos a los mensajes de MyChart en el transcurso de 1 a 2 das hbiles.  Para renovar recetas, por favor pida a su farmacia que se ponga en contacto con nuestra oficina. Harland Dingwall de fax es [place] [phone].  Si tiene un asunto urgente cuando la clnica est cerrada y que no puede esperar hasta el siguiente da hbil, puede llamar/localizar a su doctor(a) al nmero que aparece a continuacin.   Por favor, tenga en cuenta que aunque hacemos todo lo posible para estar disponibles para asuntos urgentes fuera del  horario de oficina, no estamos disponibles las 24 horas del da, los 7 das de la [place].   Si tiene un problema urgente y no puede comunicarse con nosotros, puede optar por buscar atencin mdica  en el consultorio de su doctor(a), en una clnica privada, en un centro de atencin urgente o en una sala de emergencias.  Si tiene Engineering geologist, por favor llame inmediatamente al 911 o vaya a la sala de emergencias.  Nmeros de bper  - Dr. Nehemiah Massed: [phone]  - Dra. Moye: [phone]  - Dra. Nicole Kindred: [phone]  En caso de inclemencias del [place], por favor llame a Johnsie Kindred principal al [phone] para una actualizacin sobre el [place] de cualquier retraso o cierre.  Consejos para la medicacin en dermatologa: Por favor, guarde las cajas en las que vienen los medicamentos de uso tpico para ayudarle a  seguir las instrucciones sobre dnde y cmo usarlos. Las farmacias generalmente imprimen las instrucciones del medicamento slo en las cajas y no directamente en los tubos del [place].   Si su medicamento es muy caro, por favor, pngase en contacto con Zigmund Reginold llamando al [phone] y presione la opcin 4 o envenos un mensaje a travs de Pharmacist, community.   No podemos decirle cul ser su copago por los medicamentos por adelantado ya que esto es diferente dependiendo de la cobertura de su seguro. Sin embargo, es posible que podamos encontrar un medicamento sustituto a Electrical engineer un formulario para que el seguro cubra el medicamento que se considera necesario.   Si se requiere una autorizacin previa para que su compaa de seguros [place] su medicamento, por favor permtanos de 1 a 2 das hbiles para completar este proceso.  Los precios de los medicamentos varan con frecuencia dependiendo del Environmental consultant de dnde se surte la receta y alguna farmacias pueden ofrecer precios ms baratos.  El sitio web www.goodrx.com tiene cupones para medicamentos de Airline pilot. Los precios aqu no tienen en cuenta lo que podra costar con la ayuda del seguro (puede ser ms barato con su seguro), pero el sitio web puede darle el precio si no utiliz Research scientist (physical sciences).  - Puede imprimir el cupn correspondiente y llevarlo con su receta a la farmacia.  - Tambin puede pasar por nuestra oficina durante el horario de atencin regular y Charity fundraiser una tarjeta de cupones de GoodRx.  - Si necesita que su receta se enve electrnicamente a una farmacia diferente, informe a nuestra oficina a travs de MyChart de [place] o por telfono llamando al [phone] y presione la opcin 4.

## 2022-03-28 ENCOUNTER — Ambulatory Visit
Payer: Medicare HMO | Attending: Student in an Organized Health Care Education/Training Program | Admitting: Student in an Organized Health Care Education/Training Program

## 2022-03-28 ENCOUNTER — Encounter: Payer: Self-pay | Admitting: Student in an Organized Health Care Education/Training Program

## 2022-03-28 VITALS — BP 161/94 | HR 7 | Temp 97.0°F | Ht 72.0 in | Wt 220.0 lb

## 2022-03-28 DIAGNOSIS — G894 Chronic pain syndrome: Secondary | ICD-10-CM | POA: Insufficient documentation

## 2022-03-28 DIAGNOSIS — Z79899 Other long term (current) drug therapy: Secondary | ICD-10-CM | POA: Insufficient documentation

## 2022-03-28 DIAGNOSIS — M961 Postlaminectomy syndrome, not elsewhere classified: Secondary | ICD-10-CM | POA: Diagnosis present

## 2022-03-28 DIAGNOSIS — G5701 Lesion of sciatic nerve, right lower limb: Secondary | ICD-10-CM | POA: Diagnosis present

## 2022-03-28 DIAGNOSIS — Z981 Arthrodesis status: Secondary | ICD-10-CM | POA: Diagnosis present

## 2022-03-28 DIAGNOSIS — Z79891 Long term (current) use of opiate analgesic: Secondary | ICD-10-CM | POA: Diagnosis present

## 2022-03-28 DIAGNOSIS — M546 Pain in thoracic spine: Secondary | ICD-10-CM | POA: Diagnosis present

## 2022-03-28 DIAGNOSIS — G8929 Other chronic pain: Secondary | ICD-10-CM | POA: Diagnosis present

## 2022-03-28 DIAGNOSIS — M5416 Radiculopathy, lumbar region: Secondary | ICD-10-CM | POA: Diagnosis not present

## 2022-03-28 MED ORDER — HYDROCODONE-ACETAMINOPHEN 10-325 MG PO TABS: 1.0000 | ORAL_TABLET | Freq: Two times a day (BID) | ORAL | 0 refills | Status: DC | PRN

## 2022-03-28 NOTE — Progress Notes (Signed)
Nursing Pain Medication Assessment:  Safety precautions to be maintained throughout the outpatient stay will include: orient to surroundings, keep bed in low position, maintain call bell within reach at all times, provide assistance with transfer out of bed and ambulation.  Medication Inspection Compliance: Pill count conducted under aseptic conditions, in front of the patient. Neither the pills nor the bottle was removed from the patient's sight at any time. Once count was completed pills were immediately returned to the patient in their original bottle.  Medication: See above Pill/Patch Count:  53 of 60 pills remain Pill/Patch Appearance: Markings consistent with prescribed medication Bottle Appearance: Standard pharmacy container. Clearly labeled. Filled Date: 79 / 28 / 2023 Last Medication intake:  TodaySafety precautions to be maintained throughout the outpatient stay will include: orient to surroundings, keep bed in low position, maintain call bell within reach at all times, provide assistance with transfer out of bed and ambulation.

## 2022-03-28 NOTE — Progress Notes (Signed)
PROVIDER NOTE: Information contained herein reflects review and annotations entered in association with encounter. Interpretation of such information and data should be left to medically-trained personnel. Information provided to patient can be located elsewhere in the medical record under "Patient Instructions". Document created using STT-dictation technology, any transcriptional errors that may result from process are unintentional.    Patient: Perry Jones  Service Category: E/M  Provider: Gillis Santa, MD  DOB: 07/09/1950  DOS: 03/28/2022  Specialty: Interventional Pain Management  MRN: [id number]  Setting: Ambulatory outpatient  PCP: Juluis Pitch, MD  Type: Established Patient    Referring Provider: Juluis Pitch, MD  Location: Office  Delivery: Face-to-face     HPI  Perry Jones, a 71 y.o. year old male, is here today because of his Chronic radicular lumbar pain [M54.16, G89.29]. Perry Jones primary complain today is Back Pain (lower)  Last encounter: My last encounter with him was on 01/03/22  Pertinent problems: Perry Jones has Back muscle spasm; Chronic knee pain after total replacement of right knee joint; DDD (degenerative disc disease), lumbar; Essential hypertension; Chronic radicular lumbar pain; History of total right knee replacement; History of lumbar fusion (L1-L3); Chronic bilateral low back pain with bilateral sciatica; Chronic pain syndrome; Lumbar facet arthropathy; and Piriformis syndrome, right on their pertinent problem list. Pain Assessment: Severity of Chronic pain is reported as a 6 /10. Location: Back Left, Right/pain radiaities everywhere. Onset: 1 to 4 weeks ago. Quality: Aching, Constant, Throbbing, Nagging, Stabbing, Discomfort. Timing: Constant. Modifying factor(s): Meds and ice, stimaltor. Vitals:  height is 6' (1.829 m) and weight is 220 lb (99.8 kg). His temperature is 97 F (36.1 C) (abnormal). His blood pressure is 161/94 (abnormal) and his pulse  is 7 (abnormal). His oxygen saturation is 100%.   Reason for encounter: medication management.  Patient presents today for medication management.  No significant change in his medical history.  Feels that his spinal cord stimulator settings are optimized as possible.  He is having increased right knee pain and states that his spinal cord stimulation is also providing relief there.  He is on gabapentin 300 mg 5 times a day managed by his neurologist.  He is to start this is resulting in cognitive side effects of confusion and difficulty focusing.  I informed him to contact his neurologist to discuss weaning down gabapentin and perhaps transitioning to Lyrica.  He is also on Cymbalta 30 mg twice a day which he does not see benefit from.  This is also managed by neurology.   Pharmacotherapy Assessment  Analgesic: hydrocodone 10 mg, 2 times daily as needed    Monitoring: [place] PMP: PDMP not reviewed this encounter.       Pharmacotherapy: No side-effects or adverse reactions reported. Compliance: No problems identified. Effectiveness: Clinically acceptable.  Chauncey Fischer, RN  03/28/2022  8:54 AM  Sign when Signing Visit Nursing Pain Medication Assessment:  Safety precautions to be maintained throughout the outpatient stay will include: orient to surroundings, keep bed in low position, maintain call bell within reach at all times, provide assistance with transfer out of bed and ambulation.  Medication Inspection Compliance: Pill count conducted under aseptic conditions, in front of the patient. Neither the pills nor the bottle was removed from the patient's sight at any time. Once count was completed pills were immediately returned to the patient in their original bottle.  Medication: See above Pill/Patch Count:  53 of 60 pills remain Pill/Patch Appearance: Markings consistent with prescribed medication Bottle Appearance:  Standard pharmacy container. Clearly labeled. Filled Date: 43 / 28 /  2023 Last Medication intake:  TodaySafety precautions to be maintained throughout the outpatient stay will include: orient to surroundings, keep bed in low position, maintain call bell within reach at all times, provide assistance with transfer out of bed and ambulation.    UDS:  Summary  Date Value Ref Range Status  10/04/2021 Note  Final    Comment:    ==================================================================== [employer] (MW) ==================================================================== Test                             Result       Flag       Units  Drug Present and Declared for Prescription Verification   Hydrocodone                    1019         EXPECTED   ng/mg creat   Hydromorphone                  557          EXPECTED   ng/mg creat   Dihydrocodeine                 80           EXPECTED   ng/mg creat   Norhydrocodone                 1407         EXPECTED   ng/mg creat    Sources of hydrocodone include scheduled prescription medications.    Hydromorphone, dihydrocodeine and norhydrocodone are expected    metabolites of hydrocodone. Hydromorphone and dihydrocodeine are    also available as scheduled prescription medications.  Drug Present not Declared for Prescription Verification   Lorazepam                      107          UNEXPECTED ng/mg creat    Source of lorazepam is a scheduled prescription medication.  ==================================================================== Test                      Result    Flag   Units      Ref Range   Creatinine              84               mg/dL      >=20 ==================================================================== Declared Medications:  The flagging and interpretation on this report are based on the  following declared medications.  Unexpected results may arise from  inaccuracies in the declared medications.   **Note: The testing scope of this panel includes these medications:   Hydrocodone  (Norco)   **Note: The testing scope of this panel does not include the  following reported medications:   Acetaminophen (Norco)  Benazepril  Biotin  Calcium  Cyclobenzaprine (Flexeril)  Diclofenac (Voltaren)  Docusate (Colace)  Gabapentin (Neurontin)  Hydrochlorothiazide  Loratadine  Multivitamin  Zolpidem (Ambien) ==================================================================== For clinical consultation, please call [phone]. ====================================================================      ROS  Constitutional: wnl Gastrointestinal: No reported hemesis, hematochezia, vomiting, or acute GI distress Musculoskeletal: Denies any acute onset joint swelling, redness, loss of ROM, or weakness Neurological: No reported episodes of acute onset apraxia, aphasia, dysarthria, agnosia, amnesia, paralysis, loss of coordination, or loss  of consciousness  Medication Review  Biotin, Calcium Polycarbophil, DULoxetine, HYDROcodone-acetaminophen, Loratadine, Multi-Vitamins, benazepril-hydrochlorthiazide, cyclobenzaprine, diclofenac Sodium, docusate sodium, and zolpidem  History Review  Allergy: Perry Jones is allergic to prednisone. Drug: Perry Jones  reports no history of drug use. Alcohol:  reports current alcohol use. Tobacco:  reports that he quit smoking about 37 years ago. His smoking use included cigarettes. He has a 36.00 pack-year smoking history. He has never used smokeless tobacco. Social: Perry Jones  reports that he quit smoking about 37 years ago. His smoking use included cigarettes. He has a 36.00 pack-year smoking history. He has never used smokeless tobacco. He reports current alcohol use. He reports that he does not use drugs. Medical:  has a past medical history of Actinic keratosis, Dysplastic nevus (03/11/2020), Dysplastic nevus (03/12/2019), High cholesterol, Hypertension, and Sinus trouble. Surgical: Perry Jones  has a past surgical history that includes Knee  arthroscopy (Right); Colonoscopy with propofol (N/A, 11/22/2017); Cholecystectomy; Lumbar fusion (N/A, 2000); Skin cancer excision; Replacement total knee (Right, 2007); Spinal cord stimulator insertion (N/A, 02/21/2021); and Spinal cord stimulator implant (02/21/2021). Family: family history includes Dementia in his mother; Heart Problems in his mother; Lung cancer in his father.  Laboratory Chemistry Profile   Renal Lab Results  Component Value Date   BUN 12 02/10/2021   CREATININE 0.72 02/10/2021   GFRAA >60 07/14/2018   GFRNONAA >60 02/10/2021    Hepatic Lab Results  Component Value Date   AST 45 (H) 09/28/2020   ALT 37 09/28/2020   ALBUMIN 4.5 09/28/2020   ALKPHOS 44 09/28/2020   LIPASE 28 07/14/2018    Electrolytes Lab Results  Component Value Date   NA 139 02/10/2021   K 3.5 02/10/2021   CL 105 02/10/2021   CALCIUM 9.6 02/10/2021    Bone Lab Results  Component Value Date   TESTOSTERONE 45 (L) 09/28/2020    Inflammation (CRP: Acute Phase) (ESR: Chronic Phase) No results found for: "CRP", "ESRSEDRATE", "LATICACIDVEN"       Note: Above Lab results reviewed.  Recent Imaging Review  DG Thoracic Spine 2 View CLINICAL DATA:  Spinal stimulator placement  EXAM: THORACIC SPINE 2 VIEWS  COMPARISON:  None.  FLUOROSCOPY TIME:  Radiation Exposure Index (as provided by the fluoroscopic device): Not available  If the device does not provide the exposure index:  Fluoroscopy Time:  5 minutes 4 seconds  Number of Acquired Images:  2  FINDINGS: Spinal stimulator leads are noted spanning from T8-T10 levels. No other focal abnormality is noted.  IMPRESSION: Spinal stimulator placement in the mid to lower thoracic spine.  Electronically Signed   By: Inez Catalina M.D.   On: 02/21/2021 09:23 DG C-Arm 1-60 Min-No Report Fluoroscopy was utilized by the requesting physician.  No radiographic  interpretation.  DG C-Arm 1-60 Min-No Report Fluoroscopy was utilized by  the requesting physician.  No radiographic  interpretation.   Note: Reviewed        Physical Exam  General appearance: Well nourished, well developed, and well hydrated. In no apparent acute distress Mental status: Alert, oriented x 3 (person, place, & time)       Respiratory: No evidence of acute respiratory distress Eyes: PERLA Vitals: BP (!) 161/94   Pulse (!) 7   Temp (!) 97 F (36.1 C)   Ht 6' (1.829 m)   Wt 220 lb (99.8 kg)   SpO2 100%   BMI 29.84 kg/m  BMI: Estimated body mass index is 29.84 kg/m as calculated from the  following:   Height as of this encounter: 6' (1.829 m).   Weight as of this encounter: 220 lb (99.8 kg). Ideal: Ideal body weight: 77.6 kg (171 lb 1.2 oz) Adjusted ideal body weight: 86.5 kg (190 lb 10.3 oz)   Lumbar Spine Area Exam  Skin & Axial Inspection: Well healed scar from previous spine surgery detected Alignment: Symmetrical Functional ROM: Pain restricted ROM      improved from before after optimization of spinal cord stimulator settings Stability: No instability detected Muscle Tone/Strength: Functionally intact. No obvious neuro-muscular anomalies detected. Sensory (Neurological): Dermatomal pain pattern  5 out of 5 strength bilateral lower extremity: Plantar flexion, dorsiflexion, knee flexion, knee extension.   Assessment   Status Diagnosis  Controlled Controlled Controlled 1. Chronic radicular lumbar pain   2. Failed back surgical syndrome   3. History of lumbar fusion (L3-S1)   4. Post laminectomy syndrome   5. Thoracic spine pain   6. Piriformis syndrome, right   7. Chronic pain syndrome   8. Chronic use of opiate for therapeutic purpose   9. Pharmacologic therapy      Plan of Care     Mr. Rickardo Jones has a current medication list which includes the following long-term medication(s): benazepril-hydrochlorthiazide, duloxetine, loratadine, zolpidem, [START ON 04/21/2022] hydrocodone-acetaminophen, [START ON  05/21/2022] hydrocodone-acetaminophen, and [START ON 06/20/2022] hydrocodone-acetaminophen.  Pharmacotherapy (Medications Ordered): Meds ordered this encounter  Medications   HYDROcodone-acetaminophen (NORCO) 10-325 MG tablet    Sig: Take 1 tablet by mouth every 12 (twelve) hours as needed for severe pain. Must last 30 days.    Dispense:  60 tablet    Refill:  0    Chronic Pain: STOP Act (Not applicable) Fill 1 day early if closed on refill date. Avoid benzodiazepines within 8 hours of opioids   HYDROcodone-acetaminophen (NORCO) 10-325 MG tablet    Sig: Take 1 tablet by mouth every 12 (twelve) hours as needed for severe pain. Must last 30 days.    Dispense:  60 tablet    Refill:  0    Chronic Pain: STOP Act (Not applicable) Fill 1 day early if closed on refill date. Avoid benzodiazepines within 8 hours of opioids   HYDROcodone-acetaminophen (NORCO) 10-325 MG tablet    Sig: Take 1 tablet by mouth every 12 (twelve) hours as needed for severe pain. Must last 30 days.    Dispense:  60 tablet    Refill:  0    Chronic Pain: STOP Act (Not applicable) Fill 1 day early if closed on refill date. Avoid benzodiazepines within 8 hours of opioids   No orders of the defined types were placed in this encounter.  Continue to utilize spinal cord stimulator Discussed gabapentin, Cymbalta management with neurologist.  Consider trial of Lyrica.  Follow-up plan:   Return in about 16 weeks (around 07/18/2022) for Medication Management, in person.        Recent Visits Date Type Provider Dept  01/03/22 Office Visit Gillis Santa, MD [facility]  Showing recent visits within past 90 days and meeting all other requirements Today's Visits Date Type Provider Dept  03/28/22 Office Visit Gillis Santa, MD [facility]  Showing today's visits and meeting all other requirements Future Appointments No visits were found meeting these conditions. Showing future appointments within next 90 days  and meeting all other requirements  I discussed the assessment and treatment plan with the patient. The patient was provided an opportunity to ask questions and all were answered. The patient  agreed with the plan and demonstrated an understanding of the instructions.  Patient advised to call back or seek an in-person evaluation if the symptoms or condition worsens.  Duration of encounter: 32mnutes.  Note by: BGillis Santa MD Date: 03/28/2022; Time: 9:18 AM

## 2022-04-19 ENCOUNTER — Emergency Department
Admission: EM | Admit: 2022-04-19 | Discharge: 2022-04-19 | Disposition: A | Discharge status: Home / Self Care | Payer: Medicare HMO | Attending: Emergency Medicine | Admitting: Emergency Medicine

## 2022-04-19 ENCOUNTER — Other Ambulatory Visit: Payer: Self-pay

## 2022-04-19 ENCOUNTER — Encounter: Payer: Self-pay | Admitting: Emergency Medicine

## 2022-04-19 DIAGNOSIS — I10 Essential (primary) hypertension: Secondary | ICD-10-CM | POA: Insufficient documentation

## 2022-04-19 DIAGNOSIS — W5501XA Bitten by cat, initial encounter: Secondary | ICD-10-CM | POA: Insufficient documentation

## 2022-04-19 DIAGNOSIS — S61431A Puncture wound without foreign body of right hand, initial encounter: Secondary | ICD-10-CM | POA: Insufficient documentation

## 2022-04-19 DIAGNOSIS — S61531A Puncture wound without foreign body of right wrist, initial encounter: Secondary | ICD-10-CM | POA: Diagnosis not present

## 2022-04-19 DIAGNOSIS — S6991XA Unspecified injury of right wrist, hand and finger(s), initial encounter: Secondary | ICD-10-CM | POA: Diagnosis present

## 2022-04-19 MED ORDER — AMOXICILLIN-POT CLAVULANATE 875-125 MG PO TABS: 1.0000 | ORAL_TABLET | Freq: Two times a day (BID) | ORAL | 0 refills | Status: AC

## 2022-04-19 NOTE — Discharge Instructions (Signed)
-  Please take the full course of your antibiotics as prescribed.  -Continue to keep your wound clean.  Wash daily with soap and water.  Recommend applying a topical antibiotic ointment, such as Neosporin or bacitracin to the wound sites daily.  -You may take Tylenol/ibuprofen as needed for pain/swelling.  -Return to the emergency department anytime if you begin to experience any new or worsening symptoms.

## 2022-04-19 NOTE — ED Triage Notes (Signed)
Patient ambulatory to triage with steady gait, without difficulty or distress noted; pt reports that he was bit by his cat this morning (reports up-to-date on vaccines); bruising/swelling noted to rt wrist; punctures noted around thumb

## 2022-04-19 NOTE — ED Provider Notes (Signed)
[facility] Provider Note    None    (approximate)   History   Chief Complaint Animal Bite   HPI Perry Jones is a 71 y.o. male, history of hypertension, DDD, insomnia, presents to the emergency department for evaluation of cat bite.  Patient states that he was bitten by his cat this morning because he was agitated.  Cat is up-to-date on all vaccinations.  Patient states that he does not suspect that the cat has rabies.  He is up-to-date on his tetanus.  Bleeding was controlled at home with brief pressure.  Denies any other injuries.  Denies fever/chills, forearm pain, chest pain, shortness of breath, flank pain, nausea/vomiting, headache, dizziness/lightheadedness, vision changes, or rash/lesions.  History Limitations: No limitations.        Physical Exam  Triage Vital Signs: ED Triage Vitals  Enc Vitals Group     BP 04/19/22 0658 (!) 153/85     Pulse Rate 04/19/22 0658 71     Resp 04/19/22 0658 18     Temp 04/19/22 0658 97.8 F (36.6 C)     Temp Source 04/19/22 0658 Oral     SpO2 04/19/22 0658 97 %     Weight 04/19/22 0657 220 lb (99.8 kg)     Height 04/19/22 0657 6' (1.829 m)     Head Circumference --      Peak Flow --      Pain Score 04/19/22 0656 4     Pain Loc --      Pain Edu? --      Excl. in [place]? --     Most recent vital signs: Vitals:   04/19/22 0658  BP: (!) 153/85  Pulse: 71  Resp: 18  Temp: 97.8 F (36.6 C)  SpO2: 97%    General: Awake, NAD.  Skin: Warm, dry. No rashes or lesions.  Eyes: PERRL. Conjunctivae normal.  CV: Good peripheral perfusion.  Resp: Normal effort.  Abd: Soft, non-tender. No distention.  Neuro: At baseline. No gross neurological deficits.  Musculoskeletal: Normal ROM of all extremities.  Focused Exam: 3 puncture wounds noted across the palmar aspect of the hand as well as the dorsum of the right wrist.  No active bleeding or discharge.  Wounds appear superficial.  No evidence of foreign  bodies.  No underlying osseous tenderness.  PMS intact distally.  Patient maintains full range of motion of all digits.  Normal grip strength.  Normal flexion and extension of the wrist.  Physical Exam    ED Results / Procedures / Treatments  Labs (all labs ordered are listed, but only abnormal results are displayed) Labs Reviewed - No data to display   EKG N/A.    RADIOLOGY  ED Provider Interpretation: N/A.  No results found.  PROCEDURES:  Critical Care performed: N/A.  Procedures    MEDICATIONS ORDERED IN ED: Medications - No data to display   IMPRESSION / MDM / [place] / ED COURSE  I reviewed the triage vital signs and the nursing notes.                              Differential diagnosis includes, but is not limited to, cat bite, tetanus, foreign body, flexor/extensor tendon injury  Assessment/Plan Patient presents with cat bite to the right hand and wrist.  Wounds appear superficial.  No evidence of foreign bodies.  No evidence of underlying osseous injuries.  X-ray not  indicated at this time.  Wash the puncture wounds thoroughly with soap and water wrapped in sterile gauze.  Provided patient with a prescription for Augmentin and emphasized the importance of taking it daily for the entire course.  Recommend keeping clean with soap and water daily, as well as applying a topical antibiotic ointment such as Neosporin or bacitracin to the wound sites as it continues to heal.  Patient expressed understanding and agreed.  Will discharge.  Provided the patient with anticipatory guidance, return precautions, and educational material. Encouraged the patient to return to the emergency department at any time if they begin to experience any new or worsening symptoms.   Patient's presentation is most consistent with acute, uncomplicated illness.       FINAL CLINICAL IMPRESSION(S) / ED DIAGNOSES   Final diagnoses:  Cat bite, initial encounter     Rx / DC  Orders   ED Discharge Orders          Ordered    amoxicillin-clavulanate (AUGMENTIN) 875-125 MG tablet  2 times daily        04/19/22 0711             Note:  This document was prepared using Dragon voice recognition software and may include unintentional dictation errors.   Teodoro Spray, [place] 04/19/22 6945    Arta Silence, MD 04/19/22 [phone]

## 2022-04-21 NOTE — Progress Notes (Signed)
Sleep Medicine   Office Visit  Patient Name: Perry Jones DOB: [birth year] MRN [id number]    Chief Complaint: sleep evaluation  Brief History:  Perry Jones presents for an initial consult for sleep evaluation and to establish care. Patient has a 35 year history of trouble initiating sleep.  He reports sleep quality is good once he gets to sleep. This is noted most nights. The patient's bed partner reports  some snoring when he is having an issue with his sinuses at night. The patient relates the following symptoms: trouble initiating sleep which is currently being treated with medication.(Zolpidem 10 mg) The patient's doctor would like him to have a sleep study. The patient goes to bed at 8:30-9 and lights out at 10pm (watching TV until then) and wakes up at 7am.  After lights out patient  can not go to sleep without his medication. (Zolpidem Tartrate-10 mg). 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   Patient has noted no restlessness of his legs at night that  would disrupt his sleep.  The patient  relates no unusual behavior during the night.  The patient relates  a history of psychiatric problems (depression, anxiety, suicidal attempt). The Epworth Sleepiness Score is 2 out of 24 .  The patient relates  Cardiovascular risk factors include: hypertension.  The patient reports he has had several sleep home sleep studies,the most recent was done 6 months ago ([facility]). They were both reported negative for OSA. We do not have those records.     ROS  General: (-) fever, (-) chills, (-) night sweat Nose and Sinuses: (-) nasal stuffiness or itchiness, (-) postnasal drip, (-) nosebleeds, (-) sinus trouble. Mouth and Throat: (-) sore throat, (-) hoarseness. Neck: (-) swollen glands, (-) enlarged thyroid, (-) neck pain. Respiratory: - cough, - shortness of breath, - wheezing. Neurologic: + numbness, +tingling. Psychiatric: + anxiety,  + depression Sleep behavior: -sleep paralysis -hypnogogic hallucinations -dream enactment      -vivid dreams -cataplexy -night terrors -sleep walking   Current Medication: Outpatient Encounter Medications as of 04/24/2022  Medication Sig   amoxicillin-clavulanate (AUGMENTIN) 875-125 MG tablet Take 1 tablet by mouth 2 (two) times daily for 10 days.   benazepril-hydrochlorthiazide (LOTENSIN HCT) 20-12.5 MG tablet Take 1 tablet by mouth in the morning and at bedtime.   Biotin 10000 MCG TABS Take 10,000 mcg by mouth daily.   Calcium Polycarbophil (FIBER-CAPS PO) Take 1 capsule by mouth in the morning, at noon, and at bedtime.   cyclobenzaprine (FLEXERIL) 10 MG tablet Take 10 mg by mouth 3 (three) times daily as needed for muscle spasms.   diclofenac Sodium (VOLTAREN) 1 % GEL Apply 2 g topically daily as needed (pain).   docusate sodium (COLACE) 100 MG capsule Take 100 mg by mouth daily.   DULoxetine (CYMBALTA) 30 MG capsule Take 30 mg by mouth 2 (two) times daily. '60mg'$  in the morning and 30 mg at night   [START ON 06/20/2022] HYDROcodone-acetaminophen (NORCO) 10-325 MG tablet Take 1 tablet by mouth every 12 (twelve) hours as needed for severe pain. Must last 30 days.   Loratadine 10 MG CAPS Take 10 mg by mouth daily.   LORazepam (ATIVAN) 0.5 MG tablet Take by mouth.   Multiple Vitamin (MULTI-VITAMINS) TABS Take 1 tablet by mouth daily.   pregabalin (LYRICA) 25 MG capsule Take by mouth.   zolpidem (AMBIEN) 10 MG tablet Take 1 tablet (10 mg  total) by mouth at bedtime as needed. for sleep (Patient taking differently: Take 10 mg by mouth at bedtime. for sleep)   [DISCONTINUED] HYDROcodone-acetaminophen (NORCO) 10-325 MG tablet Take 1 tablet by mouth every 12 (twelve) hours as needed for severe pain. Must last 30 days.   [DISCONTINUED] HYDROcodone-acetaminophen (NORCO) 10-325 MG tablet Take 1 tablet by mouth every 12 (twelve) hours as needed for severe pain. Must last 30 days.   No  facility-administered encounter medications on file as of 04/24/2022.    Surgical History: Past Surgical History:  Procedure Laterality Date   CHOLECYSTECTOMY     COLONOSCOPY WITH PROPOFOL N/A 11/22/2017   Procedure: COLONOSCOPY WITH PROPOFOL;  Surgeon: Lollie Sails, MD;  Location: [facility] ENDOSCOPY;  Service: Endoscopy;  Laterality: N/A;   KNEE ARTHROSCOPY Right    LUMBAR FUSION N/A 2000   L1-L4   REPLACEMENT TOTAL KNEE Right 2007   SKIN CANCER EXCISION     Bx (+) [facility]   SPINAL CORD STIMULATOR IMPLANT  02/21/2021   SPINAL CORD STIMULATOR INSERTION N/A 02/21/2021   Procedure: THORACIC SPINAL CORD STIMULATOR PERCUTANEOUS INSERTION & PULSE GENERATOR PLACEMENT;  Surgeon: [name], MD;  Location: [facility] ORS;  Service: Neurosurgery;  Laterality: N/A;  anesthesia: local w/ IV pain meds    Medical History: Past Medical History:  Diagnosis Date   Actinic keratosis    Dysplastic nevus 03/11/2020   upper back spine - DYSPLASTIC COMPOUND NEVUS WITH MODERATE ATYPIA, CLOSE TO MARGIN   Dysplastic nevus 03/12/2019   left mid back 4.0cm lat to spine - DYSPLASTIC COMPOUND NEVUS WITH MILD ATYPIA, DEEP MARGIN INVOLVED   High cholesterol    Hypertension    Sinus trouble     Family History: Non contributory to the present illness  Social History: Social History   Socioeconomic History   Marital status: Married    Spouse name: Not on file   Number of children: Not on file   Years of education: Not on file   Highest education level: Not on file  Occupational History   Not on file  Tobacco Use   Smoking status: Former    Packs/day: 2.00    Years: 18.00    Total pack years: 36.00    Types: Cigarettes    Quit date: 01/24/1985    Years since quitting: 37.2   Smokeless tobacco: Never  Vaping Use   Vaping Use: Never used  Substance and Sexual Activity   Alcohol use: Yes    Comment: 1 drink per   Drug use: No   Sexual activity: Not on file  Other Topics Concern   Not on file   Social History Narrative   Lives with wife. Perry Jones    Social Determinants of Health   Financial Resource Strain: Not on file  Food Insecurity: Not on file  Transportation Needs: Not on file  Physical Activity: Not on file  Stress: Not on file  Social Connections: Not on file  Intimate Partner Violence: Not on file    Vital Signs: Blood pressure (!) 145/96, pulse 78, resp. rate 12, height 6' (1.829 m), weight 274 lb 14.4 oz (124.7 kg), SpO2 97 %. Body mass index is 37.28 kg/m.   Examination: General Appearance: The patient is well-developed, well-nourished, and in no distress. Neck Circumference: 41cm Skin: Gross inspection of skin unremarkable. Head: normocephalic, no gross deformities. Eyes: no gross deformities noted. ENT: ears appear grossly normal Neurologic: Alert and oriented. No involuntary movements.    STOP BANG RISK ASSESSMENT S (  snore) Have you been told that you snore?     YES   T (tired) Are you often tired, fatigued, or sleepy during the day?   NO  O (obstruction) Do you stop breathing, choke, or gasp during sleep? NO   P (pressure) Do you have or are you being treated for high blood pressure? YES   B (BMI) Is your body index greater than 35 kg/m? YES   A (age) Are you 38 years old or older? NO   N (neck) Do you have a neck circumference greater than 16 inches?   YES   G (gender) Are you a male? YES   TOTAL STOP/BANG "YES" ANSWERS 4                                                               A STOP-Bang score of 2 or less is considered low risk, and a score of 5 or more is high risk for having either moderate or severe OSA. For people who score 3 or 4, doctors may need to perform further assessment to determine how likely they are to have OSA.         EPWORTH SLEEPINESS SCALE:  Scale:  (0)= no chance of dozing; (1)= slight chance of dozing; (2)= moderate chance of dozing; (3)= high chance of dozing  Chance  Situtation     Sitting and reading: 2    Watching TV: 0    Sitting Inactive in public: 0    As a passenger in car: 0      Lying down to rest: 0    Sitting and talking: 0    Sitting quielty after lunch: 0    In a car, stopped in traffic: 0   TOTAL SCORE: 2   out of 24    SLEEP STUDIES: no record available. He has had several home sleep studies      LABS: No results found for this or any previous visit (from the past 2160 hour(s)).  Radiology: No results found.  No results found.  No results found.    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 OSA (obstructive sleep apnea) PLAN OSA:   Patient evaluation suggests high risk of sleep disordered breathing due to history of borderline abnormal sleep studies, concurrent muscle relaxer, opiate, sedative and benzodiazepine use which could increase risks of apnea.  Patient has comorbid cardiovascular risk factors including: hypertension which could be exacerbated by pathologic sleep-disordered breathing.  Suggest: PSG  to assess  the patient's sleep disordered breathing. The patient was  also counselled on weight loss  to optimize sleep health.   2. Essential hypertension Hypertension Counseling:   The following hypertensive lifestyle modification were recommended and discussed:  1. Limiting alcohol intake to less than 1 oz/day of ethanol:(24 oz of beer or 8 oz of wine or 2 oz of 100-proof whiskey). 2. Take baby ASA 81 mg daily. 3. Importance of regular aerobic exercise and losing weight. 4. Reduce dietary saturated fat and cholesterol intake for overall cardiovascular health. 5. Maintaining adequate dietary potassium, calcium, and magnesium intake. 6. Regular monitoring of the blood pressure. 7. Reduce sodium intake to less than 100 mmol/day (less than 2.3 gm of sodium or less than 6 gm of sodium choride)       General Counseling: I have discussed the findings of the evaluation and examination with Quillian Quince.  I have also discussed any further diagnostic evaluation thatmay be needed or ordered today. Nikolis verbalizes understanding of the findings of todays visit. We also reviewed his medications today and discussed drug interactions and side effects including but not limited excessive drowsiness and altered mental states. We also discussed that there is always a risk not just to him but also people around him. he has been encouraged to call the office with any questions or concerns that should arise related to todays visit.  No orders of the defined types were placed in this encounter.       I have personally obtained a history, evaluated the patient, evaluated pertinent data, formulated the assessment and plan and placed orders.   This patient was seen today by Tressie Ellis, PA-C in collaboration with Dr. Devona Konig.   Allyne Gee, MD [facility] Diplomate ABMS Pulmonary and Critical Care Medicine Sleep medicine

## 2022-04-24 ENCOUNTER — Ambulatory Visit (INDEPENDENT_AMBULATORY_CARE_PROVIDER_SITE_OTHER): Payer: Medicare HMO | Admitting: Internal Medicine

## 2022-04-24 VITALS — BP 145/96 | HR 78 | Resp 12 | Ht 72.0 in | Wt 274.9 lb

## 2022-04-24 DIAGNOSIS — I10 Essential (primary) hypertension: Secondary | ICD-10-CM

## 2022-04-24 DIAGNOSIS — G4733 Obstructive sleep apnea (adult) (pediatric): Secondary | ICD-10-CM | POA: Diagnosis not present

## 2022-05-14 ENCOUNTER — Emergency Department: Payer: Medicare HMO

## 2022-05-14 ENCOUNTER — Encounter: Payer: Self-pay | Admitting: Radiology

## 2022-05-14 ENCOUNTER — Encounter: Payer: Self-pay | Admitting: Student in an Organized Health Care Education/Training Program

## 2022-05-14 ENCOUNTER — Emergency Department
Admission: EM | Admit: 2022-05-14 | Discharge: 2022-05-15 | Disposition: A | Discharge status: Home / Self Care | Payer: Medicare HMO | Attending: Emergency Medicine | Admitting: Emergency Medicine

## 2022-05-14 ENCOUNTER — Other Ambulatory Visit: Payer: Self-pay

## 2022-05-14 DIAGNOSIS — W07XXXA Fall from chair, initial encounter: Secondary | ICD-10-CM | POA: Diagnosis not present

## 2022-05-14 DIAGNOSIS — S065XAA Traumatic subdural hemorrhage with loss of consciousness status unknown, initial encounter: Secondary | ICD-10-CM

## 2022-05-14 DIAGNOSIS — Y92009 Unspecified place in unspecified non-institutional (private) residence as the place of occurrence of the external cause: Secondary | ICD-10-CM | POA: Insufficient documentation

## 2022-05-14 DIAGNOSIS — S0990XA Unspecified injury of head, initial encounter: Secondary | ICD-10-CM | POA: Diagnosis present

## 2022-05-14 DIAGNOSIS — W19XXXA Unspecified fall, initial encounter: Secondary | ICD-10-CM

## 2022-05-14 DIAGNOSIS — S0181XA Laceration without foreign body of other part of head, initial encounter: Secondary | ICD-10-CM | POA: Diagnosis not present

## 2022-05-14 DIAGNOSIS — I6200 Nontraumatic subdural hemorrhage, unspecified: Secondary | ICD-10-CM | POA: Diagnosis not present

## 2022-05-14 MED ORDER — LIDOCAINE-EPINEPHRINE (PF) 1 %-1:200000 IJ SOLN
10.0000 mL | Freq: Once | INTRAMUSCULAR | Status: AC
  Administered 2022-05-14: 10 mL
  Filled 2022-05-14: qty 10

## 2022-05-14 NOTE — ED Provider Notes (Signed)
[facility] Provider Note  Patient Contact: 8:09 PM (approximate)   History   Head Injury   HPI  Perry Jones is a 71 y.o. male who presents emergency department after mechanical fall.  Patient tripped over a chair in the house, fell and struck his frontal scalp on the floor.  No loss of consciousness.  Has had headache but no acute neuro changes.  Wife is with patient and states that he is acting his normal self.  There is no unilateral weakness, slurred speech or visual changes.  Patient does have a laceration to the forehead that was bleeding on arrival, did stop with direct pressure.  Patient denies any other injuries or complaints.  Patient denies any blood thinner use of     Physical Exam   Triage Vital Signs: ED Triage Vitals  Enc Vitals Group     BP 05/14/22 1552 (!) 141/70     Pulse Rate 05/14/22 1552 76     Resp 05/14/22 1704 20     Temp 05/14/22 1552 98.3 F (36.8 C)     Temp Source 05/14/22 1552 Oral     SpO2 05/14/22 1552 95 %     Weight 05/14/22 1553 225 lb (102.1 kg)     Height 05/14/22 1553 6' (1.829 m)     Head Circumference --      Peak Flow --      Pain Score --      Pain Loc --      Pain Edu? --      Excl. in [place]? --     Most recent vital signs: Vitals:   05/14/22 2200 05/14/22 2230  BP: (!) 151/96 (!) 159/91  Pulse: 65 66  Resp: 20 18  Temp:    SpO2: 100% 100%     General: Alert and in no acute distress. Eyes:  PERRL. EOMI. Head: Right laceration noted to the midline frontal skull.  Bleeding had been controlled direct pressure.  Clot was dislodged when dressing was removed and started to have slow steady venous bleeding.  Patient has a ragged laceration measuring approximately 3 cm.  No other visible signs of trauma to the head or face.  No battle signs no raccoon eyes, serosanguineous drainage from the ears or nares.  Neck: No stridor. No cervical spine tenderness to palpation.  Cardiovascular:  Good peripheral  perfusion Respiratory: Normal respiratory effort without tachypnea or retractions. Lungs CTAB. Good air entry to the bases with no decreased or absent breath sounds. Musculoskeletal: Full range of motion to all extremities.  Neurologic:  No gross focal neurologic deficits are appreciated.  Cranial nerves II to XII grossly intact.  Equal grip strength upper extremities.  Negative Romberg's and pronator drift. Skin:   No rash noted Other:   ED Results / Procedures / Treatments   Labs (all labs ordered are listed, but only abnormal results are displayed) Labs Reviewed - No data to display   EKG     RADIOLOGY  I personally viewed, evaluated, and interpreted these images as part of my medical decision making, as well as reviewing the written report by the radiologist.  ED Provider Interpretation: Patient has a 2.5 mm parafalcine subdural hematoma.  There is no midline shift.  Discussed the results with on-call neurosurgeon, Dr. Belenda Cruise.  Neuro surgery recommends follow-up CT in 6 hours.  CT Head Wo Contrast  Result Date: 05/14/2022 CLINICAL DATA:  Head trauma. EXAM: CT HEAD WITHOUT CONTRAST TECHNIQUE: Contiguous axial images  were obtained from the base of the skull through the vertex without intravenous contrast. RADIATION DOSE REDUCTION: This exam was performed according to the departmental dose-optimization program which includes automated exposure control, adjustment of the mA and/or kV according to patient size and/or use of iterative reconstruction technique. COMPARISON:  CT head 09/28/2020 FINDINGS: Brain: There is a small right parafalcine subdural hematoma measuring 2.5 mm in the frontal parietal region. No mass effect or midline shift. Ventricles are normal in size. Gray-white matter distinction is preserved. Brain volume is age-appropriate. Vascular: Atherosclerotic calcifications are present within the cavernous internal carotid arteries. Skull: Normal. Negative for fracture or focal  lesion. Sinuses/Orbits: No acute finding. Other: There is frontal scalp soft tissue laceration. IMPRESSION: 1. Small right parafalcine subdural hematoma measuring 2.5 mm. No mass effect or midline shift. 2. Frontal scalp soft tissue laceration. Electronically Signed   By: Ronney Asters M.D.   On: 05/14/2022 18:57    PROCEDURES:  Critical Care performed: Yes, see critical care procedure note(s)  .Critical Care E&M  Performed by: Darletta Moll, PA-C Critical care provider statement:    Critical care time (minutes):  90   Critical care time was exclusive of:  Separately billable procedures and treating other patients and teaching time   Critical care was necessary to treat or prevent imminent or life-threatening deterioration of the following conditions:  Trauma and CNS failure or compromise   Critical care was time spent personally by me on the following activities:  Obtaining history from patient or surrogate, examination of patient, discussions with consultants, development of treatment plan with patient or surrogate, ordering and performing treatments and interventions, ordering and review of radiographic studies, re-evaluation of patient's condition and review of old charts   I assumed direction of critical care for this patient from another provider in my specialty: no   After initial E/M assessment, critical care services were subsequently performed that were exclusive of separately billable procedures or treatment.   Marland Kitchen.Laceration Repair  Date/Time: 05/14/2022 11:02 PM  Performed by: Darletta Moll, PA-C Authorized by: Darletta Moll, PA-C   Consent:    Consent obtained:  Verbal   Consent given by:  Patient   Risks discussed:  Pain, poor cosmetic result, poor wound healing, need for additional repair and infection Universal protocol:    Procedure explained and questions answered to patient or proxy's satisfaction: yes     Immediately prior to procedure, a time  out was called: yes     Patient identity confirmed:  Verbally with patient Anesthesia:    Anesthesia method:  Local infiltration   Local anesthetic:  Lidocaine 1% WITH epi Laceration details:    Location:  Face   Face location:  Forehead   Length (cm):  11 Pre-procedure details:    Preparation:  Patient was prepped and draped in usual sterile fashion Exploration:    Limited defect created (wound extended): no     Hemostasis achieved with:  Epinephrine and tied off vessels   Imaging obtained comment:  CT scan   Imaging outcome: foreign body not noted     Wound exploration: wound explored through full range of motion and entire depth of wound visualized     Wound extent: no foreign body, no nerve damage, no underlying fracture and no vascular damage     Contaminated: no   Treatment:    Area cleansed with:  Povidone-iodine   Amount of cleaning:  Extensive   Irrigation solution:  Sterile saline   Irrigation  volume:  500 ml   Irrigation method:  Syringe   Layers/structures repaired:  Deep subcutaneous Deep subcutaneous:    Suture size:  5-0   Suture material:  Monocryl   Suture technique:  Simple interrupted   Number of sutures:  3 Skin repair:    Repair method:  Sutures   Suture size:  5-0   Suture material:  Nylon   Suture technique:  Running locked, horizontal mattress and simple interrupted   Number of sutures:  5 (Patient had running interlock suture with 12 throws.  Patient had 2 simple erupted into horizontal mattress was placed into extension of the laceration off the left side of the vertical laceration) Approximation:    Approximation:  Close Repair type:    Repair type:  Complex Post-procedure details:    Dressing:  Open (no dressing)   Procedure completion:  Tolerated well, no immediate complications Comments:     Patient had laceration to the forehead that in total measured approximately 11 cm.  Patient had a vertical section with 2 extensions off the left side of  the laceration.  In the central area of the tissue is macerated.  Patient had a running interlock suture on the vertical portion of the laceration with 1 running interlock suture with 12 throws placed.  Patient then had 2 horizontal mattress sutures placed to 2 extensions off of the vertical laceration.  Over the macerated area there are 2 simple interrupted sutures placed.    MEDICATIONS ORDERED IN ED: Medications  lidocaine-EPINEPHrine (PF) (XYLOCAINE-EPINEPHrine) 1 %-1:200000 (PF) injection 10 mL (10 mLs Infiltration Given by Other 05/14/22 2008)     IMPRESSION / MDM / ASSESSMENT AND PLAN / ED COURSE  I reviewed the triage vital signs and the nursing notes.                              Differential diagnosis includes, but is not limited to, injury, skull fracture, intracranial hemorrhage, facial laceration  Patient's presentation is most consistent with acute presentation with potential threat to life or bodily function.   Patient's diagnosis is consistent with fall, subdural hematoma, facial laceration.  Patient presented to the emergency department after having a mechanical fall.  He tripped over a piece of furniture, fell and hit his head on the floor.  He sustained a laceration to the forehead which was repaired as described above.  Patient tolerated procedure well.  Patient had imaging of his head from triage, it was noted that the patient had a parafalcine subdural hematoma measuring 2.5 mm.  I discussed the patient with on-call neurosurgeon, Dr. Belenda Cruise.  Dr Belenda Cruise Advises to repeat head CT in 6 hours which is currently ordered.  Patient has been resting comfortably with reassuring vitals.  Patient had pain to the forehead with a laceration but has not had any significant headache.  There is been no development of neurodeficits.  Patient is currently at his baseline according to his wife who is with the patient..  At shift change patient will be handed off to attending provider, Dr. Cinda Quest.   As long as imaging reveals no enlargement of subdural hematoma suspect discharge.  If there is a worsening of the hematoma or patient develops other symptoms then appropriate management be undertaken by attending provider.     FINAL CLINICAL IMPRESSION(S) / ED DIAGNOSES   Final diagnoses:  Subdural hematoma ([place])  Fall, initial encounter  Facial laceration, initial encounter  Note:  This document was prepared using Dragon voice recognition software and may include unintentional dictation errors.   Darletta Moll, PA-C 05/14/22 2317    Nena Polio, MD 05/15/22 [phone]

## 2022-05-14 NOTE — ED Notes (Signed)
Called CT at this time to inquire when patient will receive his CT scan, CT states that there is 8 people ahead of him at this time.

## 2022-05-14 NOTE — ED Notes (Signed)
ED Provider at bedside to suture head wound

## 2022-05-14 NOTE — ED Triage Notes (Signed)
Pt states he was stepping over a chair and tripped and fell face first. Pt states he didn't lose consciousness. No blood thinners . Pt had an old injury to the area. PT has an open wound to his forehead that is currently bleeding but wrapped in triage. Pt is alert and oriented.

## 2022-05-15 ENCOUNTER — Emergency Department: Payer: Medicare HMO

## 2022-05-15 NOTE — ED Provider Notes (Signed)
-----------------------------------------   2:07 AM on 05/15/2022 ----------------------------------------- Patient reports he feels well.  He does not have a bad headache or any other problems.  He sitting up on the stool.  He is ready to go home.  CT read by radiology reviewed by me shows no change from prior.  I will let him go as previously decided.   Nena Polio, MD 05/15/22 [phone]

## 2022-05-15 NOTE — Telephone Encounter (Signed)
Follow label instructions.

## 2022-05-15 NOTE — Discharge Instructions (Signed)
Please follow-up with your doctor in  5 days to get your stitches looked at possibly taken out.  If there are any signs of infection like fever redness or increasing swelling please return here or see your doctor sooner.  If you develop nausea vomiting severe headache or dizziness or incoordination please return here at once.  That should not happen however.  I would take it easy for the next day or 2.  If you have a headache with exertion follow-up with your doctor.  You will need to return to physical activity slowly in that case.

## 2022-05-20 ENCOUNTER — Encounter: Payer: Self-pay | Admitting: Student in an Organized Health Care Education/Training Program

## 2022-05-29 ENCOUNTER — Other Ambulatory Visit: Payer: Self-pay | Admitting: Student

## 2022-05-29 DIAGNOSIS — S46011A Strain of muscle(s) and tendon(s) of the rotator cuff of right shoulder, initial encounter: Secondary | ICD-10-CM

## 2022-05-29 DIAGNOSIS — M7521 Bicipital tendinitis, right shoulder: Secondary | ICD-10-CM

## 2022-05-29 DIAGNOSIS — M7581 Other shoulder lesions, right shoulder: Secondary | ICD-10-CM

## 2022-05-29 DIAGNOSIS — M25511 Pain in right shoulder: Secondary | ICD-10-CM

## 2022-06-09 ENCOUNTER — Other Ambulatory Visit: Payer: Self-pay | Admitting: Student

## 2022-06-09 DIAGNOSIS — S46011A Strain of muscle(s) and tendon(s) of the rotator cuff of right shoulder, initial encounter: Secondary | ICD-10-CM

## 2022-06-09 DIAGNOSIS — M7581 Other shoulder lesions, right shoulder: Secondary | ICD-10-CM

## 2022-06-09 DIAGNOSIS — M25511 Pain in right shoulder: Secondary | ICD-10-CM

## 2022-06-09 DIAGNOSIS — M7521 Bicipital tendinitis, right shoulder: Secondary | ICD-10-CM

## 2022-06-14 ENCOUNTER — Ambulatory Visit: Admission: RE | Admit: 2022-06-14 | Payer: Medicare HMO | Source: Ambulatory Visit

## 2022-06-20 ENCOUNTER — Ambulatory Visit
Admission: RE | Admit: 2022-06-20 | Discharge: 2022-06-20 | Disposition: A | Discharge status: Home / Self Care | Payer: Medicare HMO | Source: Ambulatory Visit | Attending: Student | Admitting: Student

## 2022-06-20 DIAGNOSIS — S46011A Strain of muscle(s) and tendon(s) of the rotator cuff of right shoulder, initial encounter: Secondary | ICD-10-CM | POA: Diagnosis present

## 2022-06-20 DIAGNOSIS — M25511 Pain in right shoulder: Secondary | ICD-10-CM | POA: Diagnosis present

## 2022-06-20 DIAGNOSIS — M7521 Bicipital tendinitis, right shoulder: Secondary | ICD-10-CM

## 2022-06-20 DIAGNOSIS — M7581 Other shoulder lesions, right shoulder: Secondary | ICD-10-CM | POA: Diagnosis present

## 2022-07-13 ENCOUNTER — Ambulatory Visit
Payer: Medicare HMO | Attending: Student in an Organized Health Care Education/Training Program | Admitting: Student in an Organized Health Care Education/Training Program

## 2022-07-13 ENCOUNTER — Encounter: Payer: Self-pay | Admitting: Student in an Organized Health Care Education/Training Program

## 2022-07-13 DIAGNOSIS — G894 Chronic pain syndrome: Secondary | ICD-10-CM

## 2022-07-13 DIAGNOSIS — Z79899 Other long term (current) drug therapy: Secondary | ICD-10-CM

## 2022-07-13 DIAGNOSIS — G8929 Other chronic pain: Secondary | ICD-10-CM | POA: Diagnosis present

## 2022-07-13 DIAGNOSIS — Z79891 Long term (current) use of opiate analgesic: Secondary | ICD-10-CM | POA: Diagnosis present

## 2022-07-13 DIAGNOSIS — M5416 Radiculopathy, lumbar region: Secondary | ICD-10-CM | POA: Insufficient documentation

## 2022-07-13 MED ORDER — HYDROCODONE-ACETAMINOPHEN 10-325 MG PO TABS: 1.0000 | ORAL_TABLET | Freq: Two times a day (BID) | ORAL | 0 refills | Status: DC | PRN

## 2022-07-13 NOTE — Progress Notes (Signed)
PROVIDER NOTE: Information contained herein reflects review and annotations entered in association with encounter. Interpretation of such information and data should be left to medically-trained personnel. Information provided to patient can be located elsewhere in the medical record under "Patient Instructions". Document created using STT-dictation technology, any transcriptional errors that may result from process are unintentional.    Patient: Perry Jones  Service Category: E/M  Provider: Gillis Santa, MD  DOB: 09/08/1950  DOS: 07/13/2022  Specialty: Interventional Pain Management  MRN: [id number]  Setting: Ambulatory outpatient  PCP: Juluis Pitch, MD  Type: Established Patient    Referring Provider: Juluis Pitch, MD  Location: Office  Delivery: Face-to-face     HPI  [name], a 72 y.o. year old male, is here today because of his No primary diagnosis found.. [name] primary complain today is Knee Pain (right) and Back Pain (lower)  Last encounter: My last encounter with him was on 03/28/22  Pertinent problems: [name] has Back muscle spasm; Chronic knee pain after total replacement of right knee joint; DDD (degenerative disc disease), lumbar; Essential hypertension; Chronic radicular lumbar pain; History of total right knee replacement; History of lumbar fusion (L1-L3); Chronic bilateral low back pain with bilateral sciatica; Chronic pain syndrome; Lumbar facet arthropathy; and Piriformis syndrome, right on their pertinent problem list. Pain Assessment: Severity of Chronic pain is reported as a 6 /10. Location: Back Lower, Right, Left/down legs bilat to ankles. Onset: More than a month ago. Quality: Numbness, Shooting, Burning. Timing: Constant. Modifying factor(s): pregabalin, vicodin. Vitals:  height is 6' (1.829 m) and weight is 225 lb (102.1 kg). His temporal temperature is 97.1 F (36.2 C) (abnormal). His blood pressure is 138/60 and his pulse is 70. His  respiration is 17 and oxygen saturation is 100%.   Reason for encounter: medication management.  Patient presents today for medication management.  Since his last clinic visit with me, unfortunately Perry Jones sustained a mechanical fall where he tripped over a chair in his house and hit his front scalp.  He sustained a laceration to his forehead.  CT scan was performed in the ED which does not show any progression of his subdural hematoma.  He has a scar present on his forehead that is healing.  Kristen with [employer] is also here today to help him optimize his spinal cord stimulator settings for a paresthesia free program.  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  He continues Lyrica as prescribed which is managed by neurology.  Patient also had a right steroid shoulder injection performed in early December which he states was helpful.   Pharmacotherapy Assessment  Analgesic: hydrocodone 10 mg, 2 times daily as needed    Monitoring: [place] PMP: PDMP reviewed during this encounter.       Pharmacotherapy: No side-effects or adverse reactions reported. Compliance: No problems identified. Effectiveness: Clinically acceptable.  Rise Patience, RN  07/13/2022  8:38 AM  Sign when Signing Visit Nursing Pain Medication Assessment:  Safety precautions to be maintained throughout the outpatient stay will include: orient to surroundings, keep bed in low position, maintain call bell within reach at all times, provide assistance with transfer out of bed and ambulation.  Medication Inspection Compliance: Pill count conducted under aseptic conditions, in front of the patient. Neither the pills nor the bottle was removed from the patient's sight at any time. Once count was completed  pills were immediately returned to the patient in their original bottle.  Medication:  Hydrocodone/APAP Pill/Patch Count:  38.5 of 60 pills remain Pill/Patch Appearance: Markings consistent with prescribed medication Bottle Appearance: Standard pharmacy container. Clearly labeled. Filled Date: 36 / 28 / 2023 Last Medication intake:  Today     UDS:  Summary  Date Value Ref Range Status  10/04/2021 Note  Final    Comment:    ==================================================================== [employer] (MW) ==================================================================== Test                             Result       Flag       Units  Drug Present and Declared for Prescription Verification   Hydrocodone                    1019         EXPECTED   ng/mg creat   Hydromorphone                  557          EXPECTED   ng/mg creat   Dihydrocodeine                 80           EXPECTED   ng/mg creat   Norhydrocodone                 1407         EXPECTED   ng/mg creat    Sources of hydrocodone include scheduled prescription medications.    Hydromorphone, dihydrocodeine and norhydrocodone are expected    metabolites of hydrocodone. Hydromorphone and dihydrocodeine are    also available as scheduled prescription medications.  Drug Present not Declared for Prescription Verification   Lorazepam                      107          UNEXPECTED ng/mg creat    Source of lorazepam is a scheduled prescription medication.  ==================================================================== Test                      Result    Flag   Units      Ref Range   Creatinine              84               mg/dL      >=20 ==================================================================== Declared Medications:  The flagging and interpretation on this report are based on the  following declared medications.  Unexpected results may arise from  inaccuracies in the declared medications.   **Note: The testing scope of this panel includes these medications:   Hydrocodone (Norco)    **Note: The testing scope of this panel does not include the  following reported medications:   Acetaminophen (Norco)  Benazepril  Biotin  Calcium  Cyclobenzaprine (Flexeril)  Diclofenac (Voltaren)  Docusate (Colace)  Gabapentin (Neurontin)  Hydrochlorothiazide  Loratadine  Multivitamin  Zolpidem (Ambien) ==================================================================== For clinical consultation, please call [phone]. ====================================================================      ROS  Constitutional: wnl Gastrointestinal: No reported hemesis, hematochezia, vomiting, or acute GI distress Musculoskeletal: Denies any acute onset joint swelling, redness, loss of ROM, or weakness Neurological: No reported episodes of acute onset apraxia, aphasia, dysarthria, agnosia, amnesia, paralysis, loss of coordination, or loss of consciousness  Medication Review  Biotin, Calcium Polycarbophil, HYDROcodone-acetaminophen, LORazepam, Loratadine, Multi-Vitamins, benazepril-hydrochlorthiazide, cyclobenzaprine, diclofenac Sodium, docusate sodium, pregabalin, and zolpidem  History Review  Allergy: [name] is allergic to gluten meal, pine tar, and prednisone. Drug: [name]  reports no history of drug use. Alcohol:  reports current alcohol use. Tobacco:  reports that he quit smoking about 37 years ago. His smoking use included cigarettes. He has a 36.00 pack-year smoking history. He has never used smokeless tobacco. Social: [name]  reports that he quit smoking about 37 years ago. His smoking use included cigarettes. He has a 36.00 pack-year smoking history. He has never used smokeless tobacco. He reports current alcohol use. He reports that he does not use drugs. Medical:  has a past medical history of Actinic keratosis, Dysplastic nevus (03/11/2020), Dysplastic nevus (03/12/2019), High cholesterol, Hypertension, and Sinus trouble. Surgical: Perry Jones  has a past surgical  history that includes Knee arthroscopy (Right); Colonoscopy with propofol (N/A, 11/22/2017); Cholecystectomy; Lumbar fusion (N/A, 2000); Skin cancer excision; Replacement total knee (Right, 2007); Spinal cord stimulator insertion (N/A, 02/21/2021); and Spinal cord stimulator implant (02/21/2021). Family: family history includes Dementia in his mother; Heart Problems in his mother; Lung cancer in his father.  Laboratory Chemistry Profile   Renal Lab Results  Component Value Date   BUN 12 02/10/2021   CREATININE 0.72 02/10/2021   GFRAA >60 07/14/2018   GFRNONAA >60 02/10/2021    Hepatic Lab Results  Component Value Date   AST 45 (H) 09/28/2020   ALT 37 09/28/2020   ALBUMIN 4.5 09/28/2020   ALKPHOS 44 09/28/2020   LIPASE 28 07/14/2018    Electrolytes Lab Results  Component Value Date   NA 139 02/10/2021   K 3.5 02/10/2021   CL 105 02/10/2021   CALCIUM 9.6 02/10/2021    Bone Lab Results  Component Value Date   TESTOSTERONE 45 (L) 09/28/2020    Inflammation (CRP: Acute Phase) (ESR: Chronic Phase) No results found for: "CRP", "ESRSEDRATE", "LATICACIDVEN"       Note: Above Lab results reviewed.  Recent Imaging Review  MR SHOULDER RIGHT WO CONTRAST CLINICAL DATA:  Felt a pain in the right arm obvious 2023. Painful range of motion.  EXAM: MRI OF THE RIGHT SHOULDER WITHOUT CONTRAST  TECHNIQUE: Multiplanar, multisequence MR imaging of the shoulder was performed. No intravenous contrast was administered.  COMPARISON:  None Available.  FINDINGS: Rotator cuff: Complete tear of the supraspinatus tendon with 5.2 cm of retraction. Moderate tendinosis of the infraspinatus tendon. Teres minor tendon is intact. Mild tendinosis of the subscapularis tendon.  Muscles: No muscle atrophy or edema. No intramuscular fluid collection or hematoma.  Biceps Long Head: Moderate tendinosis of the intra-articular portion of the long head of the biceps tendon.  Acromioclavicular Joint:  Moderate arthropathy of the acromioclavicular joint. Trace subacromial/subdeltoid bursal fluid.  Glenohumeral Joint: No joint effusion. No chondral defect.  Labrum: Grossly intact, but evaluation is limited by lack of intraarticular fluid/contrast.  Bones: No fracture or dislocation. No aggressive osseous lesion.  Other: No fluid collection or hematoma.  IMPRESSION: 1. Complete tear of the supraspinatus tendon with 5.2 cm of retraction. 2. Moderate tendinosis of the infraspinatus tendon. 3. Mild tendinosis of the subscapularis tendon. 4. Moderate tendinosis of the intra-articular portion of the long head of the biceps tendon.  Electronically Signed   By: Kathreen Devoid M.D.   On: 06/22/2022 07:18  Note: Reviewed        Physical Exam  General appearance: Well nourished, well  developed, and well hydrated. In no apparent acute distress Mental status: Alert, oriented x 3 (person, place, & time)       Respiratory: No evidence of acute respiratory distress Eyes: PERLA Vitals: BP 138/60   Pulse 70   Temp (!) 97.1 F (36.2 C) (Temporal)   Resp 17   Ht 6' (1.829 m)   Wt 225 lb (102.1 kg)   SpO2 100%   BMI 30.52 kg/m  BMI: Estimated body mass index is 30.52 kg/m as calculated from the following:   Height as of this encounter: 6' (1.829 m).   Weight as of this encounter: 225 lb (102.1 kg). Ideal: Ideal body weight: 77.6 kg (171 lb 1.2 oz) Adjusted ideal body weight: 87.4 kg (192 lb 10.3 oz)   Lumbar Spine Area Exam  Skin & Axial Inspection: Well healed scar from previous spine surgery detected Alignment: Symmetrical Functional ROM: Pain restricted ROM      improved from before after optimization of spinal cord stimulator settings Stability: No instability detected Muscle Tone/Strength: Functionally intact. No obvious neuro-muscular anomalies detected. Sensory (Neurological): Dermatomal pain pattern  5 out of 5 strength bilateral lower extremity: Plantar flexion,  dorsiflexion, knee flexion, knee extension.   Assessment   Status Diagnosis  Controlled Controlled Controlled 1. Chronic pain syndrome   2. Pharmacologic therapy   3. Chronic radicular lumbar pain   4. Chronic use of opiate for therapeutic purpose      Plan of Care     [name] has a current medication list which includes the following long-term medication(s): benazepril-hydrochlorthiazide, loratadine, pregabalin, zolpidem, [START ON 07/22/2022] hydrocodone-acetaminophen, [START ON 08/21/2022] hydrocodone-acetaminophen, and [START ON 09/20/2022] hydrocodone-acetaminophen.  Pharmacotherapy (Medications Ordered): Meds ordered this encounter  Medications   HYDROcodone-acetaminophen (NORCO) 10-325 MG tablet    Sig: Take 1 tablet by mouth every 12 (twelve) hours as needed for severe pain. Must last 30 days.    Dispense:  60 tablet    Refill:  0    Chronic Pain: STOP Act (Not applicable) Fill 1 day early if closed on refill date. Avoid benzodiazepines within 8 hours of opioids   HYDROcodone-acetaminophen (NORCO) 10-325 MG tablet    Sig: Take 1 tablet by mouth every 12 (twelve) hours as needed for severe pain. Must last 30 days.    Dispense:  60 tablet    Refill:  0    Chronic Pain: STOP Act (Not applicable) Fill 1 day early if closed on refill date. Avoid benzodiazepines within 8 hours of opioids   HYDROcodone-acetaminophen (NORCO) 10-325 MG tablet    Sig: Take 1 tablet by mouth every 12 (twelve) hours as needed for severe pain. Must last 30 days.    Dispense:  60 tablet    Refill:  0    Chronic Pain: STOP Act (Not applicable) Fill 1 day early if closed on refill date. Avoid benzodiazepines within 8 hours of opioids   No orders of the defined types were placed in this encounter.  Continue to utilize spinal cord stimulator Continue Lyrica as prescribed  Follow-up plan:   Return in about 3 months (around 10/12/2022) for Medication Management, in person.        Recent  Visits No visits were found meeting these conditions. Showing recent visits within past 90 days and meeting all other requirements Today's Visits Date Type Provider Dept  07/13/22 Office Visit Gillis Santa, MD [facility]  Showing today's visits and meeting all other requirements Future Appointments Date Type Provider Dept  10/10/22 Appointment Gillis Santa, MD [facility]  Showing future appointments within next 90 days and meeting all other requirements  I discussed the assessment and treatment plan with the patient. The patient was provided an opportunity to ask questions and all were answered. The patient agreed with the plan and demonstrated an understanding of the instructions.  Patient advised to call back or seek an in-person evaluation if the symptoms or condition worsens.  Duration of encounter: 10mnutes.  Note by: BGillis Santa MD Date: 07/13/2022; Time: 9:24 AM

## 2022-07-13 NOTE — Progress Notes (Signed)
Nursing Pain Medication Assessment:  Safety precautions to be maintained throughout the outpatient stay will include: orient to surroundings, keep bed in low position, maintain call bell within reach at all times, provide assistance with transfer out of bed and ambulation.  Medication Inspection Compliance: Pill count conducted under aseptic conditions, in front of the patient. Neither the pills nor the bottle was removed from the patient's sight at any time. Once count was completed pills were immediately returned to the patient in their original bottle.  Medication: Hydrocodone/APAP Pill/Patch Count:  38.5 of 60 pills remain Pill/Patch Appearance: Markings consistent with prescribed medication Bottle Appearance: Standard pharmacy container. Clearly labeled. Filled Date: 39 / 28 / 2023 Last Medication intake:  Today

## 2022-08-17 ENCOUNTER — Telehealth: Payer: Self-pay | Admitting: Student in an Organized Health Care Education/Training Program

## 2022-08-17 NOTE — Telephone Encounter (Signed)
Patient called and informed that NO medications will be prescribed other than pain medications per Dr. Dossie Arbour.

## 2022-08-17 NOTE — Telephone Encounter (Signed)
Patient is asking if Dr Holley Raring will write his sleep meds, he currently sees Dr Raul Del for this and it is the only reason he goes there. Please advise patient

## 2022-10-10 ENCOUNTER — Ambulatory Visit
Payer: Medicare HMO | Attending: Student in an Organized Health Care Education/Training Program | Admitting: Student in an Organized Health Care Education/Training Program

## 2022-10-10 ENCOUNTER — Encounter: Payer: Self-pay | Admitting: Student in an Organized Health Care Education/Training Program

## 2022-10-10 VITALS — BP 115/83 | HR 69 | Temp 97.9°F | Resp 16 | Ht 72.0 in | Wt 220.0 lb

## 2022-10-10 DIAGNOSIS — Z79891 Long term (current) use of opiate analgesic: Secondary | ICD-10-CM | POA: Diagnosis present

## 2022-10-10 DIAGNOSIS — M546 Pain in thoracic spine: Secondary | ICD-10-CM | POA: Diagnosis present

## 2022-10-10 DIAGNOSIS — M5416 Radiculopathy, lumbar region: Secondary | ICD-10-CM | POA: Insufficient documentation

## 2022-10-10 DIAGNOSIS — Z96651 Presence of right artificial knee joint: Secondary | ICD-10-CM | POA: Diagnosis present

## 2022-10-10 DIAGNOSIS — M47816 Spondylosis without myelopathy or radiculopathy, lumbar region: Secondary | ICD-10-CM | POA: Insufficient documentation

## 2022-10-10 DIAGNOSIS — G8929 Other chronic pain: Secondary | ICD-10-CM | POA: Insufficient documentation

## 2022-10-10 DIAGNOSIS — Z981 Arthrodesis status: Secondary | ICD-10-CM | POA: Insufficient documentation

## 2022-10-10 DIAGNOSIS — M961 Postlaminectomy syndrome, not elsewhere classified: Secondary | ICD-10-CM | POA: Diagnosis present

## 2022-10-10 DIAGNOSIS — M25561 Pain in right knee: Secondary | ICD-10-CM | POA: Insufficient documentation

## 2022-10-10 DIAGNOSIS — G894 Chronic pain syndrome: Secondary | ICD-10-CM | POA: Insufficient documentation

## 2022-10-10 DIAGNOSIS — Z79899 Other long term (current) drug therapy: Secondary | ICD-10-CM | POA: Insufficient documentation

## 2022-10-10 MED ORDER — HYDROCODONE-ACETAMINOPHEN 10-325 MG PO TABS: 1.0000 | ORAL_TABLET | Freq: Three times a day (TID) | ORAL | 0 refills | Status: DC | PRN

## 2022-10-10 NOTE — Progress Notes (Signed)
PROVIDER NOTE: Information contained herein reflects review and annotations entered in association with encounter. Interpretation of such information and data should be left to medically-trained personnel. Information provided to patient can be located elsewhere in the medical record under "Patient Instructions". Document created using STT-dictation technology, any transcriptional errors that may result from process are unintentional.    Patient: Perry Jones  Service Category: E/M  Provider: Edward Jolly, MD  DOB: 02/09/1951  DOS: 10/10/2022  Specialty: Interventional Pain Management  MRN: [id number]  Setting: Ambulatory outpatient  PCP: Dorothey Baseman, MD  Type: Established Patient    Referring Provider: Dorothey Baseman, MD  Location: Office  Delivery: Face-to-face     HPI  Perry Jones, a 72 y.o. 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 Perry Jones primary complain today is Back Pain (Lumbar bilateral ) and Knee Pain (Right )  Last encounter: My last encounter with him was on 07/13/22  Pertinent problems: Perry Jones has Back muscle spasm; Chronic knee pain after total replacement of right knee joint; DDD (degenerative disc disease), lumbar; Essential hypertension; Chronic radicular lumbar pain; History of total right knee replacement; History of lumbar fusion (L1-L3); Chronic bilateral low back pain with bilateral sciatica; Chronic pain syndrome; Lumbar facet arthropathy; and Piriformis syndrome, right on their pertinent problem list. Pain Assessment: Severity of Chronic pain is reported as a 8 /10. Location: Back (left knee) Lower, Left, Right/knee pain goes down into the foot and great toe.  back goes down alternating legs, never at the same time.. Onset: More than a month ago. Quality: Discomfort, Pins and needles, Tingling, Numbness, Constant. Timing: Constant. Modifying factor(s):  medications, ice,. Vitals:  height is 6' (1.829 m) and weight is 220 lb (99.8 kg). His temporal temperature is 97.9 F (36.6 C). His blood pressure is 115/83 and his pulse is 69. His respiration is 16 and oxygen saturation is 98%.   Reason for encounter: medication management.  Patient presents today for medication management. He got a new puppy which is keeping him active and busy. Requesting increase in his Hydrocodone from #60 to #75 given that he is more active.  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 He states that the right knee pain is more severe and restricting some ADLs. Discussed GNB.  He continues Lyrica as prescribed which is managed by neurology.  Pharmacotherapy Assessment  Analgesic: hydrocodone 10 mg, 2-3  times daily as needed    Monitoring: [place] PMP: PDMP reviewed during this encounter.       Pharmacotherapy: No side-effects or adverse reactions reported. Compliance: No problems identified. Effectiveness: Clinically acceptable.  Vernie Ammons, RN  10/10/2022 11:12 AM  Sign when Signing Visit Nursing Pain Medication Assessment:  Safety precautions to be maintained throughout the outpatient stay will include: orient to surroundings, keep bed in low position, maintain call bell within reach at all times, provide assistance with transfer out of bed and ambulation.  Medication Inspection Compliance: Pill count conducted under aseptic conditions, in front of the patient. Neither the pills nor the bottle was removed from the patient's sight at any time. Once count was completed pills were immediately returned to the patient in their original bottle.  Medication: Hydrocodone/APAP Pill/Patch Count:  34.5 of 60 pills remain Pill/Patch Appearance: Markings consistent with prescribed medication Bottle Appearance:  Standard pharmacy container. Clearly  labeled. Filled Date: 04 / 03 / 2024 Last Medication intake:  Today     UDS:  Summary  Date Value Ref Range Status  10/04/2021 Note  Final    Comment:    ==================================================================== [employer] Select 13 (MW) ==================================================================== Test                             Result       Flag       Units  Drug Present and Declared for Prescription Verification   Hydrocodone                    1019         EXPECTED   ng/mg creat   Hydromorphone                  557          EXPECTED   ng/mg creat   Dihydrocodeine                 80           EXPECTED   ng/mg creat   Norhydrocodone                 1407         EXPECTED   ng/mg creat    Sources of hydrocodone include scheduled prescription medications.    Hydromorphone, dihydrocodeine and norhydrocodone are expected    metabolites of hydrocodone. Hydromorphone and dihydrocodeine are    also available as scheduled prescription medications.  Drug Present not Declared for Prescription Verification   Lorazepam                      107          UNEXPECTED ng/mg creat    Source of lorazepam is a scheduled prescription medication.  ==================================================================== Test                      Result    Flag   Units      Ref Range   Creatinine              84               mg/dL      >=16 ==================================================================== Declared Medications:  The flagging and interpretation on this report are based on the  following declared medications.  Unexpected results may arise from  inaccuracies in the declared medications.   **Note: The testing scope of this panel includes these medications:   Hydrocodone (Norco)   **Note: The testing scope of this panel does not include the  following reported medications:   Acetaminophen (Norco)  Benazepril  Biotin  Calcium  Cyclobenzaprine (Flexeril)   Diclofenac (Voltaren)  Docusate (Colace)  Gabapentin (Neurontin)  Hydrochlorothiazide  Loratadine  Multivitamin  Zolpidem (Ambien) ==================================================================== For clinical consultation, please call [phone]. ====================================================================      ROS  Constitutional: wnl Gastrointestinal: No reported hemesis, hematochezia, vomiting, or acute GI distress Musculoskeletal:  right knee pain Neurological: No reported episodes of acute onset apraxia, aphasia, dysarthria, agnosia, amnesia, paralysis, loss of coordination, or loss of consciousness  Medication Review  Biotin, Calcium Polycarbophil, Echinacea, HYDROcodone-acetaminophen, LORazepam, Loratadine, Multi-Vitamins, Vitamin C, benazepril-hydrochlorthiazide, cyclobenzaprine, diclofenac Sodium, docusate sodium, ezetimibe, naproxen, pregabalin, and zolpidem  History Review  Allergy: Perry Jones is allergic to gluten meal, pine tar, pistachio  nut (diagnostic), and prednisone. Drug: Perry Jones  reports no history of drug use. Alcohol:  reports current alcohol use. Tobacco:  reports that he quit smoking about 37 years ago. His smoking use included cigarettes. He has a 36.00 pack-year smoking history. He has never used smokeless tobacco. Social: Perry Jones  reports that he quit smoking about 37 years ago. His smoking use included cigarettes. He has a 36.00 pack-year smoking history. He has never used smokeless tobacco. He reports current alcohol use. He reports that he does not use drugs. Medical:  has a past medical history of Actinic keratosis, Dysplastic nevus (03/11/2020), Dysplastic nevus (03/12/2019), High cholesterol, Hypertension, and Sinus trouble. Surgical: Perry Jones  has a past surgical history that includes Knee arthroscopy (Right); Colonoscopy with propofol (N/A, 11/22/2017); Cholecystectomy; Lumbar fusion (N/A, 2000); Skin cancer excision; Replacement  total knee (Right, 2007); Spinal cord stimulator insertion (N/A, 02/21/2021); and Spinal cord stimulator implant (02/21/2021). Family: family history includes Dementia in his mother; Heart Problems in his mother; Lung cancer in his father.  Laboratory Chemistry Profile   Renal Lab Results  Component Value Date   BUN 12 02/10/2021   CREATININE 0.72 02/10/2021   GFRAA >60 07/14/2018   GFRNONAA >60 02/10/2021    Hepatic Lab Results  Component Value Date   AST 45 (H) 09/28/2020   ALT 37 09/28/2020   ALBUMIN 4.5 09/28/2020   ALKPHOS 44 09/28/2020   LIPASE 28 07/14/2018    Electrolytes Lab Results  Component Value Date   NA 139 02/10/2021   K 3.5 02/10/2021   CL 105 02/10/2021   CALCIUM 9.6 02/10/2021    Bone Lab Results  Component Value Date   TESTOSTERONE 45 (L) 09/28/2020    Inflammation (CRP: Acute Phase) (ESR: Chronic Phase) No results found for: "CRP", "ESRSEDRATE", "LATICACIDVEN"       Note: Above Lab results reviewed.  Recent Imaging Review  MR SHOULDER RIGHT WO CONTRAST CLINICAL DATA:  Felt a pain in the right arm obvious 2023. Painful range of motion.  EXAM: MRI OF THE RIGHT SHOULDER WITHOUT CONTRAST  TECHNIQUE: Multiplanar, multisequence MR imaging of the shoulder was performed. No intravenous contrast was administered.  COMPARISON:  None Available.  FINDINGS: Rotator cuff: Complete tear of the supraspinatus tendon with 5.2 cm of retraction. Moderate tendinosis of the infraspinatus tendon. Teres minor tendon is intact. Mild tendinosis of the subscapularis tendon.  Muscles: No muscle atrophy or edema. No intramuscular fluid collection or hematoma.  Biceps Long Head: Moderate tendinosis of the intra-articular portion of the long head of the biceps tendon.  Acromioclavicular Joint: Moderate arthropathy of the acromioclavicular joint. Trace subacromial/subdeltoid bursal fluid.  Glenohumeral Joint: No joint effusion. No chondral defect.  Labrum:  Grossly intact, but evaluation is limited by lack of intraarticular fluid/contrast.  Bones: No fracture or dislocation. No aggressive osseous lesion.  Other: No fluid collection or hematoma.  IMPRESSION: 1. Complete tear of the supraspinatus tendon with 5.2 cm of retraction. 2. Moderate tendinosis of the infraspinatus tendon. 3. Mild tendinosis of the subscapularis tendon. 4. Moderate tendinosis of the intra-articular portion of the long head of the biceps tendon.  Electronically Signed   By: Elige Ko M.D.   On: 06/22/2022 07:18  Note: Reviewed        Physical Exam  General appearance: Well nourished, well developed, and well hydrated. In no apparent acute distress Mental status: Alert, oriented x 3 (person, place, & time)       Respiratory: No evidence of acute respiratory  distress Eyes: PERLA Vitals: BP 115/83 (BP Location: Right Arm, Patient Position: Sitting, Cuff Size: Normal)   Pulse 69   Temp 97.9 F (36.6 C) (Temporal)   Resp 16   Ht 6' (1.829 m)   Wt 220 lb (99.8 kg)   SpO2 98%   BMI 29.84 kg/m  BMI: Estimated body mass index is 29.84 kg/m as calculated from the following:   Height as of this encounter: 6' (1.829 m).   Weight as of this encounter: 220 lb (99.8 kg). Ideal: Ideal body weight: 77.6 kg (171 lb 1.2 oz) Adjusted ideal body weight: 86.5 kg (190 lb 10.3 oz)   Lumbar Spine Area Exam  Skin & Axial Inspection: Well healed scar from previous spine surgery detected Alignment: Symmetrical Functional ROM: Pain restricted ROM      improved from before after optimization of spinal cord stimulator settings Stability: No instability detected Muscle Tone/Strength: Functionally intact. No obvious neuro-muscular anomalies detected. Sensory (Neurological): Dermatomal pain pattern   Right knee pain, worse with weight bearing   5 out of 5 strength bilateral lower extremity: Plantar flexion, dorsiflexion, knee flexion, knee extension.   Assessment    Status Diagnosis  Persistent Controlled Controlled 1. 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 Chronic pain syndrome   3. Chronic radicular lumbar pain   4. Chronic use of opiate for therapeutic purpose   5. Failed back surgical syndrome   6. History of lumbar fusion (L3-S1)   7. Lumbar facet arthropathy   8. Post laminectomy syndrome   9. History of total right knee replacement   10. Thoracic spine pain   11. Pharmacologic therapy       Plan of Care     Perry Jones has a current medication list which includes the following long-term medication(s): benazepril-hydrochlorthiazide, ezetimibe, loratadine, pregabalin, zolpidem, [START ON 10/25/2022] hydrocodone-acetaminophen, [START ON 11/24/2022] hydrocodone-acetaminophen, and [START ON 12/24/2022] hydrocodone-acetaminophen.  Pharmacotherapy (Medications Ordered): Meds ordered this encounter  Medications   HYDROcodone-acetaminophen (NORCO) 10-325 MG tablet    Sig: Take 1 tablet by mouth every 8 (eight) hours as needed for severe pain. Must last 30 days.    Dispense:  75 tablet    Refill:  0    Chronic Pain: STOP Act (Not applicable) Fill 1 day early if closed on refill date. Avoid benzodiazepines within 8 hours of opioids   HYDROcodone-acetaminophen (NORCO) 10-325 MG tablet    Sig: Take 1 tablet by mouth every 8 (eight) hours as needed for severe pain. Must last 30 days.    Dispense:  75 tablet    Refill:  0    Chronic Pain: STOP Act (Not applicable) Fill 1 day early if closed on refill date. Avoid benzodiazepines within 8 hours of opioids   HYDROcodone-acetaminophen (NORCO) 10-325 MG tablet    Sig: Take 1 tablet by mouth every 8 (eight) hours as needed for severe pain. Must last 30 days.    Dispense:  75 tablet    Refill:  0    Chronic Pain: STOP Act (Not applicable) Fill 1 day early if closed on refill date. Avoid benzodiazepines within 8 hours of opioids   Orders Placed This Encounter   Procedures   GENICULAR NERVE BLOCK    Indication(s):  Sub-acute knee pain    Standing Status:   Future    Standing Expiration Date:   01/09/2023    Scheduling Instructions:     Side: RIGHT     Timeframe: As  soon as schedule allows    Order Specific Question:   Where will this procedure be performed?    Answer:   [facility]   [employer] Select 13 (MW), Urine    Volume: 30 ml(s). Minimum 3 ml of urine is needed. Document temperature of fresh sample. Indications: Long term (current) use of opiate analgesic (Z79.891)    Order Specific Question:   Release to patient    Answer:   Immediate   Continue to utilize spinal cord stimulator Continue Lyrica as prescribed  Follow-up plan:   Return in about 22 days (around 11/01/2022) for Right GNB, in clinic [facility].        Recent Visits Date Type Provider Dept  07/13/22 Office Visit Edward Jolly, MD [facility]  Showing recent visits within past 90 days and meeting all other requirements Today's Visits Date Type Provider Dept  10/10/22 Office Visit Edward Jolly, MD [facility]  Showing today's visits and meeting all other requirements Future Appointments No visits were found meeting these conditions. Showing future appointments within next 90 days and meeting all other requirements  I discussed the assessment and treatment plan with the patient. The patient was provided an opportunity to ask questions and all were answered. The patient agreed with the plan and demonstrated an understanding of the instructions.  Patient advised to call back or seek an in-person evaluation if the symptoms or condition worsens.  Duration of encounter: .  Note by: Edward Jolly, MD Date: 10/10/2022; Time: 11:42 AM

## 2022-10-10 NOTE — Progress Notes (Signed)
Nursing Pain Medication Assessment:  Safety precautions to be maintained throughout the outpatient stay will include: orient to surroundings, keep bed in low position, maintain call bell within reach at all times, provide assistance with transfer out of bed and ambulation.  Medication Inspection Compliance: Pill count conducted under aseptic conditions, in front of the patient. Neither the pills nor the bottle was removed from the patient's sight at any time. Once count was completed pills were immediately returned to the patient in their original bottle.  Medication: Hydrocodone/APAP Pill/Patch Count:  34.5 of 60 pills remain Pill/Patch Appearance: Markings consistent with prescribed medication Bottle Appearance: Standard pharmacy container. Clearly labeled. Filled Date: 04 / 03 / 2024 Last Medication intake:  Today

## 2022-10-14 LAB — TOXASSURE SELECT 13 (MW), URINE

## 2022-11-01 ENCOUNTER — Ambulatory Visit
Payer: Medicare HMO | Attending: Student in an Organized Health Care Education/Training Program | Admitting: Student in an Organized Health Care Education/Training Program

## 2022-11-01 ENCOUNTER — Ambulatory Visit
Admission: RE | Admit: 2022-11-01 | Discharge: 2022-11-01 | Disposition: A | Discharge status: Home / Self Care | Payer: Medicare HMO | Source: Ambulatory Visit | Attending: Student in an Organized Health Care Education/Training Program | Admitting: Student in an Organized Health Care Education/Training Program

## 2022-11-01 ENCOUNTER — Encounter: Payer: Self-pay | Admitting: Student in an Organized Health Care Education/Training Program

## 2022-11-01 DIAGNOSIS — G894 Chronic pain syndrome: Secondary | ICD-10-CM | POA: Insufficient documentation

## 2022-11-01 DIAGNOSIS — G8929 Other chronic pain: Secondary | ICD-10-CM | POA: Diagnosis present

## 2022-11-01 DIAGNOSIS — Z96651 Presence of right artificial knee joint: Secondary | ICD-10-CM | POA: Diagnosis not present

## 2022-11-01 DIAGNOSIS — M25561 Pain in right knee: Secondary | ICD-10-CM | POA: Diagnosis not present

## 2022-11-01 MED ORDER — ROPIVACAINE HCL 2 MG/ML IJ SOLN
9.0000 mL | Freq: Once | INTRAMUSCULAR | Status: AC
  Administered 2022-11-01: 9 mL via PERINEURAL
  Filled 2022-11-01: qty 20

## 2022-11-01 MED ORDER — LIDOCAINE HCL 2 % IJ SOLN
20.0000 mL | Freq: Once | INTRAMUSCULAR | Status: AC
  Administered 2022-11-01: 400 mg
  Filled 2022-11-01: qty 20

## 2022-11-01 MED ORDER — DEXAMETHASONE SODIUM PHOSPHATE 10 MG/ML IJ SOLN
10.0000 mg | Freq: Once | INTRAMUSCULAR | Status: AC
  Administered 2022-11-01: 10 mg
  Filled 2022-11-01: qty 1

## 2022-11-01 NOTE — Patient Instructions (Signed)

## 2022-11-01 NOTE — Progress Notes (Signed)
PROVIDER NOTE: Interpretation of information contained herein should be left to medically-trained personnel. Specific patient instructions are provided elsewhere under "Patient Instructions" section of medical record. This document was created in part using STT-dictation technology, any transcriptional errors that may result from this process are unintentional.  Patient: Perry Jones Type: Established DOB: 09-02-1950 MRN: [id number] PCP: Dorothey Baseman, MD  Service: Procedure DOS: 11/01/2022 Setting: Ambulatory Location: Ambulatory outpatient facility Delivery: Face-to-face Provider: Edward Jolly, MD Specialty: Interventional Pain Management Specialty designation: 09 Location: Outpatient facility Ref. Prov.: Edward Jolly, MD       Interventional Therapy   Primary Reason for Visit: Interventional Pain Management Treatment. CC: Knee Pain (right)   Procedure:               Type: Genicular Nerves Block (Superolateral, Superomedial, and Inferomedial Genicular Nerves)  #1  Laterality: Right (-RT)  Level: Superior and inferior to the knee joint.  Imaging: Fluoroscopic guidance Anesthesia: Local anesthesia (1-2% Lidocaine) DOS: 11/01/2022  Performed by: Edward Jolly, MD  Purpose: Diagnostic/Therapeutic Indications: Chronic knee pain severe enough to impact quality of life or function. Rationale (medical necessity): procedure needed and proper for the diagnosis and/or treatment of Perry Jones medical symptoms and needs. 1. Chronic pain syndrome   2. Chronic knee pain after total replacement of right knee joint    NAS-11 Pain score:   Pre-procedure: 8 /10   Post-procedure: 0-No pain/10     Target: For Genicular Nerve block(s), the targets are: the superolateral genicular nerve, located in the lateral distal portion of the femoral shaft as it curves to form the lateral epicondyle, in the region of the distal femoral metaphysis; the superomedial genicular nerve, located in the medial  distal portion of the femoral shaft as it curves to form the medial epicondyle; and the inferomedial genicular nerve, located in the medial, proximal portion of the tibial shaft, as it curves to form the medial epicondyle, in the region of the proximal tibial metaphysis.  Location: Superolateral, Superomedial, and Inferomedial aspects of knee joint.  Region: Lateral, Anterior, and Medial aspects of the knee joint, above and below the knee joint proper. Approach: Percutaneous  Type of procedure: Percutaneous perineural nerve block. The genicular nerve block is a motor-sparing technique that anesthetizes the sensory terminal branches innervating the knee joint, resulting in anesthesia of the anterior compartment of the knee. The distribution of anesthesia of each nerve is mostly in the corresponding quadrant.  Neuroanatomy: The superolateral genicular nerve (SLGN) courses around the femur shaft to pass between the vastus lateralis and the lateral epicondyle. It accompanies the superior lateral genicular artery. The superomedial genicular nerve (SMGN) courses around the femur shaft, following the superior medial genicular artery, to pass between the adductor magnus tendon and the medial epicondyle below the vastus medialis. The inferolateral genicular nerve (ILGN) courses around the tibial lateral epicondyle deep to the lateral collateral ligament, following the inferior lateral genicular artery, superior of the fibula head. The inferomedial genicular nerve (IMGN) courses horizontally below the medial collateral ligament between the tibial medial epicondyle and the insertion of the collateral ligament. It accompanies the inferior medial genicular artery. The recurrent peroneal nerve originates in the inferior popliteal region from the common peroneal nerve and courses horizontally around the fibula to pass just inferior of the fibula head and travel superior to the anterolateral tibial epicondyle. It accompanies  the recurrent tibial artery.  Position / Prep / Materials:  Position: Supine, Modified Fowler's position with pillows under the targeted knee(s). The patient  is placed in a supine position with the knee slightly flexed by placing a pillow in the popliteal fossa. Prep solution: DuraPrep (Iodine Povacrylex [0.7% available iodine] and Isopropyl Alcohol, 74% w/w) Prep Area: Entire knee area, from mid-thigh to mid-shin, lateral, anterior, and medial aspects. Materials:  Tray: Block Needle(s):  Type: Spinal  Gauge (G): 22  Length: 3.5-in  Qty: 3  Pre-op H&P Assessment:  Perry Jones is a 72 y.o. (year old), male patient, seen today for interventional treatment. He  has a past surgical history that includes Knee arthroscopy (Right); Colonoscopy with propofol (N/A, 11/22/2017); Cholecystectomy; Lumbar fusion (N/A, 2000); Skin cancer excision; Replacement total knee (Right, 2007); Spinal cord stimulator insertion (N/A, 02/21/2021); and Spinal cord stimulator implant (02/21/2021). Perry Jones has a current medication list which includes the following prescription(s): vitamin c, benazepril-hydrochlorthiazide, biotin, calcium polycarbophil, cyclobenzaprine, diclofenac sodium, docusate sodium, echinacea, ezetimibe, hydrocodone-acetaminophen, [START ON 11/24/2022] hydrocodone-acetaminophen, [START ON 12/24/2022] hydrocodone-acetaminophen, loratadine, lorazepam, multi-vitamins, pregabalin, zolpidem, and naproxen. His primarily concern today is the Knee Pain (right)  Initial Vital Signs:  Pulse/HCG Rate: 67  Temp: (!) 97.4 F (36.3 C) Resp: 18 BP: 136/73 SpO2: 100 %  BMI: Estimated body mass index is 29.84 kg/m as calculated from the following:   Height as of this encounter: 6' (1.829 m).   Weight as of this encounter: 220 lb (99.8 kg).  Risk Assessment: Allergies: Reviewed. He is allergic to gluten meal, pine tar, pistachio nut (diagnostic), and prednisone.  Allergy Precautions: None  required Coagulopathies: Reviewed. None identified.  Blood-thinner therapy: None at this time Active Infection(s): Reviewed. None identified. Perry Jones is afebrile  Site Confirmation: Perry Jones was asked to confirm the procedure and laterality before marking the site Procedure checklist: Completed Consent: Before the procedure and under the influence of no sedative(s), amnesic(s), or anxiolytics, the patient was informed of the treatment options, risks and possible complications. To fulfill our ethical and legal obligations, as recommended by the American Medical Association's Code of Ethics, I have informed the patient of my clinical impression; the nature and purpose of the treatment or procedure; the risks, benefits, and possible complications of the intervention; the alternatives, including doing nothing; the risk(s) and benefit(s) of the alternative treatment(s) or procedure(s); and the risk(s) and benefit(s) of doing nothing. The patient was provided information about the general risks and possible complications associated with the procedure. These may include, but are not limited to: failure to achieve desired goals, infection, bleeding, organ or nerve damage, allergic reactions, paralysis, and death. In addition, the patient was informed of those risks and complications associated to the procedure, such as failure to decrease pain; infection; bleeding; organ or nerve damage with subsequent damage to sensory, motor, and/or autonomic systems, resulting in permanent pain, numbness, and/or weakness of one or several areas of the body; allergic reactions; (i.e.: anaphylactic reaction); and/or death. Furthermore, the patient was informed of those risks and complications associated with the medications. These include, but are not limited to: allergic reactions (i.e.: anaphylactic or anaphylactoid reaction(s)); adrenal axis suppression; blood sugar elevation that in diabetics may result in ketoacidosis or  comma; water retention that in patients with history of congestive heart failure may result in shortness of breath, pulmonary edema, and decompensation with resultant heart failure; weight gain; swelling or edema; medication-induced neural toxicity; particulate matter embolism and blood vessel occlusion with resultant organ, and/or nervous system infarction; and/or aseptic necrosis of one or more joints. Finally, the patient was informed that Medicine is not an exact science; therefore,  there is also the possibility of unforeseen or unpredictable risks and/or possible complications that may result in a catastrophic outcome. The patient indicated having understood very clearly. We have given the patient no guarantees and we have made no promises. Enough time was given to the patient to ask questions, all of which were answered to the patient's satisfaction. Mr. Vandrunen has indicated that he wanted to continue with the procedure. Attestation: I, the ordering provider, attest that I have discussed with the patient the benefits, risks, side-effects, alternatives, likelihood of achieving goals, and potential problems during recovery for the procedure that I have provided informed consent. Date  Time: 11/01/2022 10:51 AM  Pre-Procedure Preparation:  Monitoring: As per clinic protocol. Respiration, ETCO2, SpO2, BP, heart rate and rhythm monitor placed and checked for adequate function Safety Precautions: Patient was assessed for positional comfort and pressure points before starting the procedure. Time-out: I initiated and conducted the "Time-out" before starting the procedure, as per protocol. The patient was asked to participate by confirming the accuracy of the "Time Out" information. Verification of the correct person, site, and procedure were performed and confirmed by me, the nursing staff, and the patient. "Time-out" conducted as per Joint Commission's Universal Protocol (UP.01.01.01). Time: 1126 Start Time:  1136 hrs.  Description/Narrative of Procedure:          Rationale (medical necessity): procedure needed and proper for the diagnosis and/or treatment of the patient's medical symptoms and needs. Procedural Technique Safety Precautions: Aspiration looking for blood return was conducted prior to all injections. At no point did we inject any substances, as a needle was being advanced. No attempts were made at seeking any paresthesias. Safe injection practices and needle disposal techniques used. Medications properly checked for expiration dates. SDV (single dose vial) medications used. Description of the Procedure: Protocol guidelines were followed. The patient was assisted into a comfortable position. The target area was identified and the area prepped in the usual manner. Skin & deeper tissues infiltrated with local anesthetic. Appropriate amount of time allowed to pass for local anesthetics to take effect. The procedure needles were then advanced to the target area. Proper needle placement secured. Negative aspiration confirmed. Solution injected in intermittent fashion, asking for systemic symptoms every 0.5cc of injectate. The needles were then removed and the area cleansed, making sure to leave some of the prepping solution back to take advantage of its long term bactericidal properties.  10 cc solution made of 9 cc of 0.2% ropivacaine, 1 cc of Decadron 10 mg/cc.  3 cc injected at each level below for the right knee             Vitals:   11/01/22 1055 11/01/22 1124 11/01/22 1129 11/01/22 1135  BP: 136/73 117/78 129/77 110/76  Pulse: 67 65 64 65  Resp:  18 17 17   Temp: (!) 97.4 F (36.3 C)     TempSrc: Temporal     SpO2: 100% 99% 100% 100%  Weight: 220 lb (99.8 kg)     Height: 6' (1.829 m)        Start Time: 1136 hrs. End Time: 1130 hrs.  Imaging Guidance (Non-Spinal):          Type of Imaging Technique: Fluoroscopy Guidance (Non-Spinal) Indication(s): Assistance in needle guidance  and placement for procedures requiring needle placement in or near specific anatomical locations not easily accessible without such assistance. Exposure Time: Please see nurses notes. Contrast: None used. Fluoroscopic Guidance: I was personally present during the use of fluoroscopy. "  Tunnel Vision Technique" used to obtain the best possible view of the target area. Parallax error corrected before commencing the procedure. "Direction-depth-direction" technique used to introduce the needle under continuous pulsed fluoroscopy. Once target was reached, antero-posterior, oblique, and lateral fluoroscopic projection used confirm needle placement in all planes. Images permanently stored in EMR. Interpretation: No contrast injected. I personally interpreted the imaging intraoperatively. Adequate needle placement confirmed in multiple planes. Permanent images saved into the patient's record.  Post-operative Assessment:  Post-procedure Vital Signs:  Pulse/HCG Rate: 65  Temp: (!) 97.4 F (36.3 C) Resp: 17 BP: 110/76 SpO2: 100 %  EBL: None  Complications: No immediate post-treatment complications observed by team, or reported by patient.  Note: The patient tolerated the entire procedure well. A repeat set of vitals were taken after the procedure and the patient was kept under observation following institutional policy, for this type of procedure. Post-procedural neurological assessment was performed, showing return to baseline, prior to discharge. The patient was provided with post-procedure discharge instructions, including a section on how to identify potential problems. Should any problems arise concerning this procedure, the patient was given instructions to immediately contact us, at any time, without hesitation. In any case, we plan to contact the patient by telephone for a follow-up status report regarding this interventional procedure.  Comments:  No additional relevant information.  Plan of Care  (POC)  Orders:  Orders Placed This Encounter  Procedures   DG PAIN [facility] C-ARM 1-60 MIN NO REPORT    Intraoperative interpretation by procedural physician at [facility].    Standing Status:   Standing    Number of Occurrences:   1    Order Specific Question:   Reason for exam:    Answer:   Assistance in needle guidance and placement for procedures requiring needle placement in or near specific anatomical locations not easily accessible without such assistance.   Chronic Opioid Analgesic:  hydrocodone 10 mg, 2-3  times daily as needed    Medications ordered for procedure: Meds ordered this encounter  Medications   lidocaine (XYLOCAINE) 2 % (with pres) injection 400 mg   dexamethasone (DECADRON) injection 10 mg   ropivacaine (PF) 2 mg/mL (0.2%) (NAROPIN) injection 9 mL   Medications administered: We administered lidocaine, dexamethasone, and ropivacaine (PF) 2 mg/mL (0.2%).  See the medical record for exact dosing, route, and time of administration.  Follow-up plan:   Return in about 4 weeks (around 11/29/2022) for Post Procedure Evaluation, virtual.       Recent Visits Date Type Provider Dept  10/10/22 Office Visit Edward Jolly, MD [facility]  Showing recent visits within past 90 days and meeting all other requirements Today's Visits Date Type Provider Dept  11/01/22 Procedure visit Edward Jolly, MD [facility]  Showing today's visits and meeting all other requirements Future Appointments Date Type Provider Dept  11/29/22 Appointment Edward Jolly, MD [facility]  01/18/23 Appointment Edward Jolly, MD [facility]  Showing future appointments within next 90 days and meeting all other requirements  Disposition: Discharge home  Discharge (Date  Time): 11/01/2022; 1140 hrs.   Primary Care Physician: Dorothey Baseman, MD Location: [facility] Note by: Edward Jolly, MD (TTS technology used. I  apologize for any typographical errors that were not detected and corrected.) Date: 11/01/2022; Time: 11:55 AM  Disclaimer:  Medicine is not an Visual merchandiser. The only guarantee in medicine is that nothing is guaranteed. It is important to note that the  decision to proceed with this intervention was based on the information collected from the patient. The Data and conclusions were drawn from the patient's questionnaire, the interview, and the physical examination. Because the information was provided in large part by the patient, it cannot be guaranteed that it has not been purposely or unconsciously manipulated. Every effort has been made to obtain as much relevant data as possible for this evaluation. It is important to note that the conclusions that lead to this procedure are derived in large part from the available data. Always take into account that the treatment will also be dependent on availability of resources and existing treatment guidelines, considered by other Pain Management Practitioners as being common knowledge and practice, at the time of the intervention. For Medico-Legal purposes, it is also important to point out that variation in procedural techniques and pharmacological choices are the acceptable norm. The indications, contraindications, technique, and results of the above procedure should only be interpreted and judged by a Board-Certified Interventional Pain Specialist with extensive familiarity and expertise in the same exact procedure and technique.

## 2022-11-01 NOTE — Progress Notes (Signed)
Safety precautions to be maintained throughout the outpatient stay will include: orient to surroundings, keep bed in low position, maintain call bell within reach at all times, provide assistance with transfer out of bed and ambulation.  

## 2022-11-02 ENCOUNTER — Telehealth: Payer: Self-pay

## 2022-11-02 NOTE — Telephone Encounter (Signed)
Post procedure follow up.  Patient states he is doing good.  

## 2022-11-27 DIAGNOSIS — M1612 Unilateral primary osteoarthritis, left hip: Secondary | ICD-10-CM | POA: Insufficient documentation

## 2022-11-28 ENCOUNTER — Ambulatory Visit
Payer: Medicare HMO | Attending: Student in an Organized Health Care Education/Training Program | Admitting: Student in an Organized Health Care Education/Training Program

## 2022-11-28 ENCOUNTER — Encounter: Payer: Self-pay | Admitting: Student in an Organized Health Care Education/Training Program

## 2022-11-28 DIAGNOSIS — Z96651 Presence of right artificial knee joint: Secondary | ICD-10-CM

## 2022-11-28 DIAGNOSIS — G894 Chronic pain syndrome: Secondary | ICD-10-CM | POA: Diagnosis not present

## 2022-11-28 DIAGNOSIS — G8929 Other chronic pain: Secondary | ICD-10-CM

## 2022-11-28 DIAGNOSIS — M25561 Pain in right knee: Secondary | ICD-10-CM | POA: Diagnosis not present

## 2022-11-28 NOTE — Progress Notes (Signed)
Patient: Perry Jones  Service Category: E/M  Provider: Edward Jolly, MD  DOB: Jan 20, 1951  DOS: 11/28/2022  Location: Office  MRN: [id number]  Setting: Ambulatory outpatient  Referring Provider: Dorothey Baseman, MD  Type: Established Patient  Specialty: Interventional Pain Management  PCP: Dorothey Baseman, MD  Location: Remote location  Delivery: TeleHealth     Virtual Encounter - Pain Management PROVIDER NOTE: Information contained herein reflects review and annotations entered in association with encounter. Interpretation of such information and data should be left to medically-trained personnel. Information provided to patient can be located elsewhere in the medical record under "Patient Instructions". Document created using STT-dictation technology, any transcriptional errors that may result from process are unintentional.    Contact & Pharmacy Preferred: [phone] Home: [phone] (home) Mobile: [phone] (mobile) E-mail: [email or web address]  [facility] [address] [facility] - [place], [place] - [address] AT [facility] [address] [place] [address] Phone: [phone] Fax: [phone]   Pre-screening  Mr. Chai offered "in-person" vs "virtual" encounter. He indicated preferring virtual for this encounter.   Reason COVID-19*  Social distancing based on CDC and AMA recommendations.   I contacted Umer Lubell Mangen on 11/28/2022 via telephone.      I clearly identified myself as Edward Jolly, MD. I verified that I was speaking with the correct person using two identifiers (Name: Lamour Fullbright, and date of birth: 11-Aug-1950).  Consent I sought verbal advanced consent from Hans Eden for virtual visit interactions. I informed Mr. Cada of possible security and privacy concerns, risks, and limitations associated with providing "not-in-person" medical evaluation and management services. I also informed Mr. Pinta of the availability of "in-person"  appointments. Finally, I informed him that there would be a charge for the virtual visit and that he could be  personally, fully or partially, financially responsible for it. Mr. Cerbone expressed understanding and agreed to proceed.   Historic Elements   Mr. Coit Filkins is a 72 y.o. year old, male patient evaluated today after our last contact on 11/01/2022. Mr. Cacciatore  has a past medical history of Actinic keratosis, Dysplastic nevus (03/11/2020), Dysplastic nevus (03/12/2019), High cholesterol, Hypertension, and Sinus trouble. He also  has a past surgical history that includes Knee arthroscopy (Right); Colonoscopy with propofol (N/A, 11/22/2017); Cholecystectomy; Lumbar fusion (N/A, 2000); Skin cancer excision; Replacement total knee (Right, 2007); Spinal cord stimulator insertion (N/A, 02/21/2021); and Spinal cord stimulator implant (02/21/2021). Mr. Zakowski has a current medication list which includes the following prescription(s): vitamin c, benazepril-hydrochlorthiazide, biotin, calcium polycarbophil, cyclobenzaprine, diclofenac sodium, docusate sodium, echinacea, ezetimibe, hydrocodone-acetaminophen, [START ON 12/24/2022] hydrocodone-acetaminophen, loratadine, lorazepam, multi-vitamins, pregabalin, zolpidem, hydrocodone-acetaminophen, and naproxen. He  reports that he quit smoking about 37 years ago. His smoking use included cigarettes. He has a 36.00 pack-year smoking history. He has never used smokeless tobacco. He reports current alcohol use. He reports that he does not use drugs. Mr. Boehnke is allergic to gluten meal, pine tar, pistachio nut (diagnostic), pistachio nut extract, and prednisone.  BMI: Estimated body mass index is 29.84 kg/m as calculated from the following:   Height as of 11/01/22: 6' (1.829 m).   Weight as of 11/01/22: 220 lb (99.8 kg). Last encounter: 10/10/2022. Last procedure: 11/01/2022.  HPI  Today, he is being contacted for a post-procedure assessment.   Post-procedure  evaluation   Type: Genicular Nerves Block (Superolateral, Superomedial, and Inferomedial Genicular Nerves)  #1  Laterality: Right (-RT)  Level: Superior and inferior to the knee joint.  Imaging: Fluoroscopic guidance Anesthesia: Local anesthesia (1-2% Lidocaine) DOS: 11/01/2022  Performed by: Edward Jolly, MD  Purpose: Diagnostic/Therapeutic Indications: Chronic knee pain severe enough to impact quality of life or function. Rationale (medical necessity): procedure needed and proper for the diagnosis and/or treatment of Mr. Arenz medical symptoms and needs. 1. Chronic pain syndrome   2. Chronic knee pain after total replacement of right knee joint    NAS-11 Pain score:   Pre-procedure: 8 /10   Post-procedure: 0-No pain/10      Effectiveness:  Initial hour after procedure: 100 %  Subsequent 4-6 hours post-procedure: 100 %  Analgesia past initial 6 hours: 70 % (lasting a week to 10 days.)  Ongoing improvement: Analgesic:  70% for 21 days then back to baseline Function: Back to baseline ROM: Back to baseline   Pharmacotherapy Assessment   Opioid Analgesic: hydrocodone 10 mg, 2-3  times daily as needed    Monitoring: [place] PMP: PDMP reviewed during this encounter.       Pharmacotherapy: No side-effects or adverse reactions reported. Compliance: No problems identified. Effectiveness: Clinically acceptable. Plan: Refer to "POC". UDS:  Summary  Date Value Ref Range Status  10/10/2022 Note  Final    Comment:    ==================================================================== [employer] [facility] 13 (MW) ==================================================================== Test                             Result       Flag       Units  Drug Present and Declared for Prescription Verification   Hydrocodone                    1673         EXPECTED   ng/mg creat   Hydromorphone                  663          EXPECTED   ng/mg creat   Dihydrocodeine                 137           EXPECTED   ng/mg creat   Norhydrocodone                 2245         EXPECTED   ng/mg creat    Sources of hydrocodone include scheduled prescription medications.    Hydromorphone, dihydrocodeine and norhydrocodone are expected    metabolites of hydrocodone. Hydromorphone and dihydrocodeine are    also available as scheduled prescription medications.  Drug Absent but Declared for Prescription Verification   Lorazepam                      Not Detected UNEXPECTED ng/mg creat ==================================================================== Test                      Result    Flag   Units      Ref Range   Creatinine              101              mg/dL      >=16 ==================================================================== Declared Medications:  The flagging and interpretation on this report are based on the  following declared medications.  Unexpected results may arise from  inaccuracies in the declared medications.   **Note: The testing scope of this panel includes  these medications:   Hydrocodone (Norco)  Lorazepam (Ativan)   **Note: The testing scope of this panel does not include the  following reported medications:   Acetaminophen (Norco)  Benazepril (Lotensin)  Biotin  Calcium  Cyclobenzaprine (Flexeril)  Diclofenac (Voltaren)  Docusate (Colace)  Ezetimibe (Zetia)  Loratadine  Multivitamin  Naproxen (Naprosyn)  Pregabalin (Lyrica)  Supplement  Vitamin C  Zolpidem (Ambien) ==================================================================== For clinical consultation, please call [phone]. ====================================================================    No results found for: "CBDTHCR", "D8THCCBX", "D9THCCBX"   Laboratory Chemistry Profile   Renal Lab Results  Component Value Date   BUN 12 02/10/2021   CREATININE 0.72 02/10/2021   GFRAA >60 07/14/2018   GFRNONAA >60 02/10/2021    Hepatic Lab Results  Component Value Date   AST 45 (H)  09/28/2020   ALT 37 09/28/2020   ALBUMIN 4.5 09/28/2020   ALKPHOS 44 09/28/2020   LIPASE 28 07/14/2018    Electrolytes Lab Results  Component Value Date   NA 139 02/10/2021   K 3.5 02/10/2021   CL 105 02/10/2021   CALCIUM 9.6 02/10/2021    Bone Lab Results  Component Value Date   TESTOSTERONE 45 (L) 09/28/2020    Inflammation (CRP: Acute Phase) (ESR: Chronic Phase) No results found for: "CRP", "ESRSEDRATE", "LATICACIDVEN"       Note: Above Lab results reviewed.  Assessment  The primary encounter diagnosis was Chronic knee pain after total replacement of right knee joint. Diagnoses of History of total right knee replacement and Chronic pain syndrome were also pertinent to this visit.  Plan of Care  1. Chronic knee pain after total replacement of right knee joint - Radiofrequency,Genicular; Future  2. History of total right knee replacement - Radiofrequency,Genicular; Future  3. Chronic pain syndrome - Radiofrequency,Genicular; Future  Positive diagnostic genicular nerve block for the right knee.  Discussed genicular nerve radiofrequency ablation for the purpose of obtaining longer-term pain relief.  Risk and benefits reviewed and patient would like to proceed.  Orders:  Orders Placed This Encounter  Procedures   Radiofrequency,Genicular    Standing Status:   Future    Standing Expiration Date:   02/28/2023    Scheduling Instructions:     Side(s): RIGHT     Level(s): Superior-Lateral, Superior-Medial, and Inferior-Medial Genicular Nerve(s)     Sedation: without     Scheduling Timeframe: As soon as pre-approved    Order Specific Question:   Where will this procedure be performed?    Answer:   [facility]   Follow-up plan:   Return in about 22 days (around 12/20/2022) for R GN RFA, in clinic [facility].      Recent Visits Date Type Provider Dept  11/01/22 Procedure visit Edward Jolly, MD [facility]  10/10/22 Office Visit Edward Jolly, MD [facility]  Showing recent visits within past 90 days and meeting all other requirements Today's Visits Date Type Provider Dept  11/28/22 Office Visit Edward Jolly, MD [facility]  Showing today's visits and meeting all other requirements Future Appointments Date Type Provider Dept  01/18/23 Appointment Edward Jolly, MD [facility]  Showing future appointments within next 90 days and meeting all other requirements  I discussed the assessment and treatment plan with the patient. The patient was provided an opportunity to ask questions and all were answered. The patient agreed with the plan and demonstrated an understanding of the instructions.  Patient advised to call back or seek an in-person evaluation if the symptoms or  condition worsens.  Duration of encounter: .  Note by: Edward Jolly, MD Date: 11/28/2022; Time: 1:08 PM

## 2022-11-29 ENCOUNTER — Telehealth: Payer: Medicare HMO | Admitting: Student in an Organized Health Care Education/Training Program

## 2022-12-20 ENCOUNTER — Ambulatory Visit
Payer: Medicare HMO | Attending: Student in an Organized Health Care Education/Training Program | Admitting: Student in an Organized Health Care Education/Training Program

## 2022-12-20 ENCOUNTER — Encounter: Payer: Self-pay | Admitting: Student in an Organized Health Care Education/Training Program

## 2022-12-20 ENCOUNTER — Ambulatory Visit
Admission: RE | Admit: 2022-12-20 | Discharge: 2022-12-20 | Disposition: A | Discharge status: Home / Self Care | Payer: Medicare HMO | Source: Ambulatory Visit | Attending: Student in an Organized Health Care Education/Training Program | Admitting: Student in an Organized Health Care Education/Training Program

## 2022-12-20 DIAGNOSIS — G8929 Other chronic pain: Secondary | ICD-10-CM | POA: Diagnosis present

## 2022-12-20 DIAGNOSIS — G894 Chronic pain syndrome: Secondary | ICD-10-CM | POA: Diagnosis present

## 2022-12-20 DIAGNOSIS — M25561 Pain in right knee: Secondary | ICD-10-CM | POA: Diagnosis present

## 2022-12-20 DIAGNOSIS — Z96651 Presence of right artificial knee joint: Secondary | ICD-10-CM | POA: Insufficient documentation

## 2022-12-20 MED ORDER — DEXAMETHASONE SODIUM PHOSPHATE 10 MG/ML IJ SOLN
INTRAMUSCULAR | Status: AC
  Filled 2022-12-20: qty 1

## 2022-12-20 MED ORDER — LIDOCAINE HCL 2 % IJ SOLN
20.0000 mL | Freq: Once | INTRAMUSCULAR | Status: AC
  Administered 2022-12-20: 400 mg

## 2022-12-20 MED ORDER — ROPIVACAINE HCL 2 MG/ML IJ SOLN
INTRAMUSCULAR | Status: AC
  Filled 2022-12-20: qty 20

## 2022-12-20 MED ORDER — LIDOCAINE HCL 2 % IJ SOLN
INTRAMUSCULAR | Status: AC
  Filled 2022-12-20: qty 20

## 2022-12-20 MED ORDER — ROPIVACAINE HCL 2 MG/ML IJ SOLN
9.0000 mL | Freq: Once | INTRAMUSCULAR | Status: AC
  Administered 2022-12-20: 9 mL via PERINEURAL

## 2022-12-20 MED ORDER — DEXAMETHASONE SODIUM PHOSPHATE 10 MG/ML IJ SOLN
10.0000 mg | Freq: Once | INTRAMUSCULAR | Status: AC
  Administered 2022-12-20: 10 mg

## 2022-12-20 NOTE — Progress Notes (Signed)
PROVIDER NOTE: Interpretation of information contained herein should be left to medically-trained personnel. Specific patient instructions are provided elsewhere under "Patient Instructions" section of medical record. This document was created in part using STT-dictation technology, any transcriptional errors that may result from this process are unintentional.  Patient: Perry Jones Type: Established DOB: 02-13-1951 MRN: [id number] PCP: Dorothey Baseman, MD  Service: Procedure DOS: 12/20/2022 Setting: Ambulatory Location: Ambulatory outpatient facility Delivery: Face-to-face Provider: Edward Jolly, MD Specialty: Interventional Pain Management Specialty designation: 09 Location: Outpatient facility Ref. Prov.: Edward Jolly, MD       Interventional Therapy   Primary Reason for Visit: Interventional Pain Management Treatment. CC: Back Pain (Lower back and right knee)    Procedure:          Anesthesia, Analgesia, Anxiolysis:  Type: Therapeutic Superolateral, Superomedial, and Inferomedial, Genicular Nerve Radiofrequency Ablation (destruction).   #1  Region: Lateral, Anterior, and Medial aspects of the knee joint, above and below the knee joint proper. Level: Superior and inferior to the knee joint. Laterality: Right  Anesthesia: Local (1-2% Lidocaine)  Anxiolysis: None  Sedation: None  Guidance: Fluoroscopy           Position: Supine   Indications: 1. 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 History of total right knee replacement   3. Chronic pain syndrome    Perry Jones has been dealing with the above chronic pain for longer than three months and has either failed to respond, was unable to tolerate, or simply did not get enough benefit from other more conservative therapies including, but not limited to: 1. Over-the-counter medications 2. Anti-inflammatory medications 3. Muscle relaxants 4. Membrane stabilizers 5. Opioids 6. Physical therapy and/or  chiropractic manipulation 7. Modalities (Heat, ice, etc.) 8. Invasive techniques such as nerve blocks. Mr. Beaumier has attained more than 50% relief of the pain from a series of diagnostic injections conducted in separate occasions.  Pain Score: Pre-procedure: 8 /10 Post-procedure: 3 /10    Pre-op H&P Assessment:  Perry Jones is a 72 y.o. (year old), male patient, seen today for interventional treatment. He  has a past surgical history that includes Knee arthroscopy (Right); Colonoscopy with propofol (N/A, 11/22/2017); Cholecystectomy; Lumbar fusion (N/A, 2000); Skin cancer excision; Replacement total knee (Right, 2007); Spinal cord stimulator insertion (N/A, 02/21/2021); and Spinal cord stimulator implant (02/21/2021). Perry Jones has a current medication list which includes the following prescription(s): vitamin c, benazepril-hydrochlorthiazide, biotin, calcium polycarbophil, cyclobenzaprine, diclofenac sodium, docusate sodium, echinacea, ezetimibe, hydrocodone-acetaminophen, hydrocodone-acetaminophen, [START ON 12/24/2022] hydrocodone-acetaminophen, loratadine, lorazepam, multi-vitamins, pregabalin, zolpidem, and naproxen. His primarily concern today is the Back Pain (Lower back and right knee)  Initial Vital Signs:  Pulse/HCG Rate: 79ECG Heart Rate: 79 Temp: 97.9 F (36.6 C) Resp: 15 BP: 137/80 SpO2: 98 %  BMI: Estimated body mass index is 30.65 kg/m as calculated from the following:   Height as of this encounter: 6' (1.829 m).   Weight as of this encounter: 226 lb (102.5 kg).  Risk Assessment: Allergies: Reviewed. He is allergic to gluten meal, pine tar, pistachio nut (diagnostic), pistachio nut extract, and prednisone.  Allergy Precautions: None required Coagulopathies: Reviewed. None identified.  Blood-thinner therapy: None at this time Active Infection(s): Reviewed. None identified. Perry Jones is afebrile  Site Confirmation: Perry Jones was asked to confirm the procedure and laterality  before marking the site Procedure checklist: Completed Consent: Before the procedure and under the influence of no sedative(s), amnesic(s), or anxiolytics, the patient was informed of  the treatment options, risks and possible complications. To fulfill our ethical and legal obligations, as recommended by the American Medical Association's Code of Ethics, I have informed the patient of my clinical impression; the nature and purpose of the treatment or procedure; the risks, benefits, and possible complications of the intervention; the alternatives, including doing nothing; the risk(s) and benefit(s) of the alternative treatment(s) or procedure(s); and the risk(s) and benefit(s) of doing nothing. The patient was provided information about the general risks and possible complications associated with the procedure. These may include, but are not limited to: failure to achieve desired goals, infection, bleeding, organ or nerve damage, allergic reactions, paralysis, and death. In addition, the patient was informed of those risks and complications associated to the procedure, such as failure to decrease pain; infection; bleeding; organ or nerve damage with subsequent damage to sensory, motor, and/or autonomic systems, resulting in permanent pain, numbness, and/or weakness of one or several areas of the body; allergic reactions; (i.e.: anaphylactic reaction); and/or death. Furthermore, the patient was informed of those risks and complications associated with the medications. These include, but are not limited to: allergic reactions (i.e.: anaphylactic or anaphylactoid reaction(s)); adrenal axis suppression; blood sugar elevation that in diabetics may result in ketoacidosis or comma; water retention that in patients with history of congestive heart failure may result in shortness of breath, pulmonary edema, and decompensation with resultant heart failure; weight gain; swelling or edema; medication-induced neural toxicity;  particulate matter embolism and blood vessel occlusion with resultant organ, and/or nervous system infarction; and/or aseptic necrosis of one or more joints. Finally, the patient was informed that Medicine is not an exact science; therefore, there is also the possibility of unforeseen or unpredictable risks and/or possible complications that may result in a catastrophic outcome. The patient indicated having understood very clearly. We have given the patient no guarantees and we have made no promises. Enough time was given to the patient to ask questions, all of which were answered to the patient's satisfaction. Mr. Posadas has indicated that he wanted to continue with the procedure. Attestation: I, the ordering provider, attest that I have discussed with the patient the benefits, risks, side-effects, alternatives, likelihood of achieving goals, and potential problems during recovery for the procedure that I have provided informed consent. Date  Time: 12/20/2022 12:57 PM  Pre-Procedure Preparation:  Monitoring: As per clinic protocol. Respiration, ETCO2, SpO2, BP, heart rate and rhythm monitor placed and checked for adequate function Safety Precautions: Patient was assessed for positional comfort and pressure points before starting the procedure. Time-out: I initiated and conducted the "Time-out" before starting the procedure, as per protocol. The patient was asked to participate by confirming the accuracy of the "Time Out" information. Verification of the correct person, site, and procedure were performed and confirmed by me, the nursing staff, and the patient. "Time-out" conducted as per Joint Commission's Universal Protocol (UP.01.01.01). Time: 1317 Start Time: 1317 hrs.  Description of Procedure:          Target Area: For Genicular Nerve radiofrequency ablation (destruction), the targets are: the superolateral genicular nerve, located in the lateral distal portion of the femoral shaft as it curves to  form the lateral epicondyle, in the region of the distal femoral metaphysis; the superomedial genicular nerve, located in the medial distal portion of the femoral shaft as it curves to form the medial epicondyle; and the inferomedial genicular nerve, located in the medial, proximal portion of the tibial shaft, as it curves to form the medial epicondyle,  in the region of the proximal tibial metaphysis. Approach: Anterior, ipsilateral approach. Area Prepped: Entire knee area, from mid-thigh to mid-shin, lateral, anterior, and medial aspects. DuraPrep (Iodine Povacrylex [0.7% available iodine] and Isopropyl Alcohol, 74% w/w) Safety Precautions: Aspiration looking for blood return was conducted prior to all injections. At no point did we inject any substances, as a needle was being advanced. No attempts were made at seeking any paresthesias. Safe injection practices and needle disposal techniques used. Medications properly checked for expiration dates. SDV (single dose vial) medications used. Description of the Procedure: Protocol guidelines were followed. The patient was placed in position over the procedure table. The target area was identified and the area prepped in the usual manner. The skin and muscle were infiltrated with local anesthetic. Appropriate amount of time allowed to pass for local anesthetics to take effect. Radiofrequency needles were introduced to the target area using fluoroscopic guidance. Using the NeuroTherm NT1100 Radiofrequency Generator, sensory stimulation using 50 Hz was used to locate & identify the nerve, making sure that the needle was positioned such that there was no sensory stimulation below 0.3 V or above 0.7 V. Stimulation using 2 Hz was used to evaluate the motor component. Care was taken not to lesion any nerves that demonstrated motor stimulation of the lower extremities at an output of less than 2.5 times that of the sensory threshold, or a maximum of 2.0 V. Once  satisfactory placement of the needles was achieved, the numbing solution was slowly injected after negative aspiration. After waiting for at least 2 minutes, the ablation was performed at 80 degrees C for 60 seconds, using regular Radiofrequency settings. Once the procedure was completed, the needles were then removed and the area cleansed, making sure to leave some of the prepping solution back to take advantage of its long term bactericidal properties. Intra-operative Compliance: Compliant      Vitals:   12/20/22 1317 12/20/22 1322 12/20/22 1327 12/20/22 1333  BP: (!) 151/86 (!) 155/90 (!) 160/87 (!) 155/83  Pulse:      Resp: 15 17 18 18   Temp:      SpO2: 99% 99% 99% 99%  Weight:      Height:        Start Time: 1317 hrs. End Time: 1332 hrs. Materials & Medications:  Needle(s) Type: Teflon-coated, curved tip, Radiofrequency needle(s) Gauge: 22G Length: 10cm Medication(s): Please see orders for medications and dosing details. 6 cc solution made of 5 cc of 0.2% ropivacaine, 1 cc of Decadron 10 mg/cc.  2 cc injected at each level above for the right genicular nerves after sensorimotor testing, prior to lesioning. Imaging Guidance (Non-Spinal):          Type of Imaging Technique: Fluoroscopy Guidance (Non-Spinal) Indication(s): Assistance in needle guidance and placement for procedures requiring needle placement in or near specific anatomical locations not easily accessible without such assistance. Exposure Time: Please see nurses notes. Contrast: None used. Fluoroscopic Guidance: I was personally present during the use of fluoroscopy. "Tunnel Vision Technique" used to obtain the best possible view of the target area. Parallax error corrected before commencing the procedure. "Direction-depth-direction" technique used to introduce the needle under continuous pulsed fluoroscopy. Once target was reached, antero-posterior, oblique, and lateral fluoroscopic projection used confirm needle  placement in all planes. Images permanently stored in EMR. Interpretation: No contrast injected.  Antibiotic Prophylaxis:   Anti-infectives (From admission, onward)    None      Indication(s): None identified  Post-operative Assessment:  Post-procedure Vital Signs:  Pulse/HCG Rate: 7980 Temp: 97.9 F (36.6 C) Resp: 18 BP: (!) 155/83 SpO2: 99 %  EBL: None  Complications: No immediate post-treatment complications observed by team, or reported by patient.  Note: The patient tolerated the entire procedure well. A repeat set of vitals were taken after the procedure and the patient was kept under observation following institutional policy, for this type of procedure. Post-procedural neurological assessment was performed, showing return to baseline, prior to discharge. The patient was provided with post-procedure discharge instructions, including a section on how to identify potential problems. Should any problems arise concerning this procedure, the patient was given instructions to immediately contact us, at any time, without hesitation. In any case, we plan to contact the patient by telephone for a follow-up status report regarding this interventional procedure.  Comments:  No additional relevant information.  Plan of Care (POC)  Orders:  Orders Placed This Encounter  Procedures   DG PAIN CLINIC C-ARM 1-60 MIN NO REPORT    Intraoperative interpretation by procedural physician at [facility].    Standing Status:   Standing    Number of Occurrences:   1    Order Specific Question:   Reason for exam:    Answer:   Assistance in needle guidance and placement for procedures requiring needle placement in or near specific anatomical locations not easily accessible without such assistance.   Chronic Opioid Analgesic:  hydrocodone 10 mg, 2-3  times daily as needed    Medications ordered for procedure: Meds ordered this encounter  Medications   lidocaine (XYLOCAINE) 2 %  (with pres) injection 400 mg   dexamethasone (DECADRON) injection 10 mg   ropivacaine (PF) 2 mg/mL (0.2%) (NAROPIN) injection 9 mL   Medications administered: We administered lidocaine, dexamethasone, and ropivacaine (PF) 2 mg/mL (0.2%).  See the medical record for exact dosing, route, and time of administration.  Follow-up plan:   Return for Keep sch. appt.      Recent Visits Date Type Provider Dept  11/28/22 Office Visit Edward Jolly, MD [facility]  11/01/22 Procedure visit Edward Jolly, MD [facility]  10/10/22 Office Visit Edward Jolly, MD [facility]  Showing recent visits within past 90 days and meeting all other requirements Today's Visits Date Type Provider Dept  12/20/22 Procedure visit Edward Jolly, MD [facility]  Showing today's visits and meeting all other requirements Future Appointments Date Type Provider Dept  01/18/23 Appointment Edward Jolly, MD [facility]  Showing future appointments within next 90 days and meeting all other requirements  Disposition: Discharge home  Discharge (Date  Time): 12/20/2022; 1345 hrs.   Primary Care Physician: Dorothey Baseman, MD Location: [facility] Note by: Edward Jolly, MD (TTS technology used. I apologize for any typographical errors that were not detected and corrected.) Date: 12/20/2022; Time: 3:06 PM  Disclaimer:  Medicine is not an Visual merchandiser. The only guarantee in medicine is that nothing is guaranteed. It is important to note that the decision to proceed with this intervention was based on the information collected from the patient. The Data and conclusions were drawn from the patient's questionnaire, the interview, and the physical examination. Because the information was provided in large part by the patient, it cannot be guaranteed that it has not been purposely or unconsciously manipulated. Every effort has been made to obtain as  much relevant data as possible for this evaluation. It is important to note that the conclusions that lead to this procedure  are derived in large part from the available data. Always take into account that the treatment will also be dependent on availability of resources and existing treatment guidelines, considered by other Pain Management Practitioners as being common knowledge and practice, at the time of the intervention. For Medico-Legal purposes, it is also important to point out that variation in procedural techniques and pharmacological choices are the acceptable norm. The indications, contraindications, technique, and results of the above procedure should only be interpreted and judged by a Board-Certified Interventional Pain Specialist with extensive familiarity and expertise in the same exact procedure and technique.

## 2022-12-20 NOTE — Patient Instructions (Signed)

## 2022-12-21 ENCOUNTER — Telehealth: Payer: Self-pay

## 2022-12-21 NOTE — Telephone Encounter (Signed)
Post procedure follow up.  Patient states he is doing well 

## 2023-01-18 ENCOUNTER — Ambulatory Visit
Payer: Medicare HMO | Attending: Student in an Organized Health Care Education/Training Program | Admitting: Student in an Organized Health Care Education/Training Program

## 2023-01-18 ENCOUNTER — Encounter: Payer: Self-pay | Admitting: Student in an Organized Health Care Education/Training Program

## 2023-01-18 VITALS — BP 122/79 | HR 65 | Temp 98.2°F | Resp 17 | Ht 72.0 in | Wt 233.0 lb

## 2023-01-18 DIAGNOSIS — M961 Postlaminectomy syndrome, not elsewhere classified: Secondary | ICD-10-CM | POA: Insufficient documentation

## 2023-01-18 DIAGNOSIS — Z79891 Long term (current) use of opiate analgesic: Secondary | ICD-10-CM | POA: Insufficient documentation

## 2023-01-18 DIAGNOSIS — Z96651 Presence of right artificial knee joint: Secondary | ICD-10-CM | POA: Diagnosis present

## 2023-01-18 DIAGNOSIS — G8929 Other chronic pain: Secondary | ICD-10-CM | POA: Diagnosis present

## 2023-01-18 DIAGNOSIS — M5416 Radiculopathy, lumbar region: Secondary | ICD-10-CM | POA: Diagnosis present

## 2023-01-18 DIAGNOSIS — M25561 Pain in right knee: Secondary | ICD-10-CM | POA: Diagnosis present

## 2023-01-18 DIAGNOSIS — G894 Chronic pain syndrome: Secondary | ICD-10-CM | POA: Diagnosis present

## 2023-01-18 DIAGNOSIS — Z79899 Other long term (current) drug therapy: Secondary | ICD-10-CM | POA: Insufficient documentation

## 2023-01-18 MED ORDER — HYDROCODONE-ACETAMINOPHEN 10-325 MG PO TABS: 1.0000 | ORAL_TABLET | Freq: Three times a day (TID) | ORAL | 0 refills | Status: DC | PRN

## 2023-01-18 NOTE — Progress Notes (Signed)
PROVIDER NOTE: Information contained herein reflects review and annotations entered in association with encounter. Interpretation of such information and data should be left to medically-trained personnel. Information provided to patient can be located elsewhere in the medical record under "Patient Instructions". Document created using STT-dictation technology, any transcriptional errors that may result from process are unintentional.    Patient: Perry Jones  Service Category: E/M  Provider: Edward Jolly, MD  DOB: 25-Dec-1950  DOS: 01/18/2023  Specialty: Interventional Pain Management  MRN: [id number]  Setting: Ambulatory outpatient  PCP: Dorothey Baseman, MD  Type: Established Patient    Referring Provider: Dorothey Baseman, MD  Location: Office  Delivery: Face-to-face     HPI  Perry Jones, a 72 y.o. year old male, is here today because of his Chronic radicular lumbar pain [M54.16, G89.29]. Perry Jones primary complain today is Knee Pain (right) and Back Pain  Last encounter: My last encounter with him was on 12/20/22  Pertinent problems: Perry Jones has Back muscle spasm; Chronic knee pain after total replacement of right knee joint; DDD (degenerative disc disease), lumbar; Essential hypertension; Chronic radicular lumbar pain; History of total right knee replacement; History of lumbar fusion (L1-L3); Chronic bilateral low back pain with bilateral sciatica; Chronic pain syndrome; Lumbar facet arthropathy; and Piriformis syndrome, right on their pertinent problem list. Pain Assessment: Severity of Chronic pain is reported as a 8 /10. Location: Knee Right/to right toes. Onset: More than a month ago. Quality: Burning. Timing: Constant. Modifying factor(s): meds. Vitals:  height is 6' (1.829 m) and weight is 233 lb (105.7 kg). His temperature is 98.2 F (36.8 C). His blood pressure is 122/79 and his pulse is 65. His respiration is 17 and oxygen saturation is 98%.   Reason for encounter: both,  medication management and post-procedure assessment.   Post-procedure evaluation    Procedure:          Anesthesia, Analgesia, Anxiolysis:  Type: Therapeutic Superolateral, Superomedial, and Inferomedial, Genicular Nerve Radiofrequency Ablation (destruction).   #1  Region: Lateral, Anterior, and Medial aspects of the knee joint, above and below the knee joint proper. Level: Superior and inferior to the knee joint. Laterality: Right  Anesthesia: Local (1-2% Lidocaine)  Anxiolysis: None  Sedation: None  Guidance: Fluoroscopy           Position: Supine   Indications: 1. 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 History of total right knee replacement   3. Chronic pain syndrome    Perry Jones has been dealing with the above chronic pain for longer than three months and has either failed to respond, was unable to tolerate, or simply did not get enough benefit from other more conservative therapies including, but not limited to: 1. Over-the-counter medications 2. Anti-inflammatory medications 3. Muscle relaxants 4. Membrane stabilizers 5. Opioids 6. Physical therapy and/or chiropractic manipulation 7. Modalities (Heat, ice, etc.) 8. Invasive techniques such as nerve blocks. Perry Jones has attained more than 50% relief of the pain from a series of diagnostic injections conducted in separate occasions.  Pain Score: Pre-procedure: 8 /10 Post-procedure: 3 /10     Effectiveness:  Initial hour after procedure: 100 % Subsequent 4-6 hours post-procedure: 100 %  Analgesia past initial 6 hours: 20 % (X 2 days; then pain began to come back;)  Ongoing improvement:  Analgesic: 20% Function: Back to baseline ROM: Back to baseline   Pharmacotherapy Assessment  Analgesic: hydrocodone 10 mg every 8 hours as needed prn  Monitoring: [place] PMP: PDMP reviewed during this encounter.       Pharmacotherapy: No side-effects or adverse reactions reported. Compliance: No  problems identified. Effectiveness: Clinically acceptable.  Nonah Mattes, RN  01/18/2023  8:46 AM  Sign when Signing Visit Nursing Pain Medication Assessment:  Safety precautions to be maintained throughout the outpatient stay will include: orient to surroundings, keep bed in low position, maintain call bell within reach at all times, provide assistance with transfer out of bed and ambulation.  Medication Inspection Compliance: Pill count conducted under aseptic conditions, in front of the patient. Neither the pills nor the bottle was removed from the patient's sight at any time. Once count was completed pills were immediately returned to the patient in their original bottle.  Medication: Hydrocodone/APAP Pill/Patch Count:  25 of 75 pills remain Pill/Patch Appearance: Markings consistent with prescribed medication Bottle Appearance: Standard pharmacy container. Clearly labeled. Filled Date: 7 / 2 / 2024 Last Medication intake:  Today     UDS:  Summary  Date Value Ref Range Status  10/10/2022 Note  Final    Comment:    ==================================================================== [employer] [facility] (MW) ==================================================================== Test                             Result       Flag       Units  Drug Present and Declared for Prescription Verification   Hydrocodone                    1673         EXPECTED   ng/mg creat   Hydromorphone                  663          EXPECTED   ng/mg creat   Dihydrocodeine                 137          EXPECTED   ng/mg creat   Norhydrocodone                 2245         EXPECTED   ng/mg creat    Sources of hydrocodone include scheduled prescription medications.    Hydromorphone, dihydrocodeine and norhydrocodone are expected    metabolites of hydrocodone. Hydromorphone and dihydrocodeine are    also available as scheduled prescription medications.  Drug Absent but Declared for Prescription Verification    Lorazepam                      Not Detected UNEXPECTED ng/mg creat ==================================================================== Test                      Result    Flag   Units      Ref Range   Creatinine              101              mg/dL      >=93 ==================================================================== Declared Medications:  The flagging and interpretation on this report are based on the  following declared medications.  Unexpected results may arise from  inaccuracies in the declared medications.   **Note: The testing scope of this panel includes these medications:   Hydrocodone (Norco)  Lorazepam (Ativan)   **Note: The testing scope of this panel does not include  the  following reported medications:   Acetaminophen (Norco)  Benazepril (Lotensin)  Biotin  Calcium  Cyclobenzaprine (Flexeril)  Diclofenac (Voltaren)  Docusate (Colace)  Ezetimibe (Zetia)  Loratadine  Multivitamin  Naproxen (Naprosyn)  Pregabalin (Lyrica)  Supplement  Vitamin C  Zolpidem (Ambien) ==================================================================== For clinical consultation, please call [phone]. ====================================================================      ROS  Constitutional: wnl Gastrointestinal: No reported hemesis, hematochezia, vomiting, or acute GI distress Musculoskeletal:  right knee pain Neurological: No reported episodes of acute onset apraxia, aphasia, dysarthria, agnosia, amnesia, paralysis, loss of coordination, or loss of consciousness  Medication Review  Biotin, Calcium Polycarbophil, Echinacea, HYDROcodone-acetaminophen, LORazepam, Loratadine, Multi-Vitamins, Vitamin C, benazepril-hydrochlorthiazide, cyclobenzaprine, diclofenac Sodium, and docusate sodium  History Review  Allergy: Perry Jones is allergic to gluten meal, pine tar, pistachio nut (diagnostic), pistachio nut extract, and prednisone. Drug: Perry Jones  reports no history of  drug use. Alcohol:  reports current alcohol use. Tobacco:  reports that he quit smoking about 38 years ago. His smoking use included cigarettes. He started smoking about 56 years ago. He has a 36 pack-year smoking history. He has never used smokeless tobacco. Social: Perry Jones  reports that he quit smoking about 38 years ago. His smoking use included cigarettes. He started smoking about 56 years ago. He has a 36 pack-year smoking history. He has never used smokeless tobacco. He reports current alcohol use. He reports that he does not use drugs. Medical:  has a past medical history of Actinic keratosis, Dysplastic nevus (03/11/2020), Dysplastic nevus (03/12/2019), High cholesterol, Hypertension, and Sinus trouble. Surgical: Perry Jones  has a past surgical history that includes Knee arthroscopy (Right); Colonoscopy with propofol (N/A, 11/22/2017); Cholecystectomy; Lumbar fusion (N/A, 2000); Skin cancer excision; Replacement total knee (Right, 2007); Spinal cord stimulator insertion (N/A, 02/21/2021); and Spinal cord stimulator implant (02/21/2021). Family: family history includes Dementia in his mother; Heart Problems in his mother; Lung cancer in his father.  Laboratory Chemistry Profile   Renal Lab Results  Component Value Date   BUN 12 02/10/2021   CREATININE 0.72 02/10/2021   GFRAA >60 07/14/2018   GFRNONAA >60 02/10/2021    Hepatic Lab Results  Component Value Date   AST 45 (H) 09/28/2020   ALT 37 09/28/2020   ALBUMIN 4.5 09/28/2020   ALKPHOS 44 09/28/2020   LIPASE 28 07/14/2018    Electrolytes Lab Results  Component Value Date   NA 139 02/10/2021   K 3.5 02/10/2021   CL 105 02/10/2021   CALCIUM 9.6 02/10/2021    Bone Lab Results  Component Value Date   TESTOSTERONE 45 (L) 09/28/2020    Inflammation (CRP: Acute Phase) (ESR: Chronic Phase) No results found for: "CRP", "ESRSEDRATE", "LATICACIDVEN"       Note: Above Lab results reviewed.  Recent Imaging Review  DG PAIN  CLINIC C-ARM 1-60 MIN NO REPORT Fluoro was used, but no Radiologist interpretation will be provided.  Please refer to "NOTES" tab for provider progress note.  Note: Reviewed        Physical Exam  General appearance: Well nourished, well developed, and well hydrated. In no apparent acute distress Mental status: Alert, oriented x 3 (person, place, & time)       Respiratory: No evidence of acute respiratory distress Eyes: PERLA Vitals: BP 122/79   Pulse 65   Temp 98.2 F (36.8 C)   Resp 17   Ht 6' (1.829 m)   Wt 233 lb (105.7 kg)   SpO2 98%   BMI 31.60 kg/m  BMI: Estimated body mass index is 31.6 kg/m as calculated from the following:   Height as of this encounter: 6' (1.829 m).   Weight as of this encounter: 233 lb (105.7 kg). Ideal: Ideal body weight: 77.6 kg (171 lb 1.2 oz) Adjusted ideal body weight: 88.8 kg (195 lb 13.5 oz)   Lumbar Spine Area Exam  Skin & Axial Inspection: Well healed scar from previous spine surgery detected Alignment: Symmetrical Functional ROM: Pain restricted ROM      improved from before after optimization of spinal cord stimulator settings Stability: No instability detected Muscle Tone/Strength: Functionally intact. No obvious neuro-muscular anomalies detected. Sensory (Neurological): Dermatomal pain pattern   Right knee pain, worse with weight bearing   5 out of 5 strength bilateral lower extremity: Plantar flexion, dorsiflexion, knee flexion, knee extension.   Assessment   Status Diagnosis  Persistent Persistent Controlled 1. Chronic radicular lumbar pain   2. Chronic knee pain after total replacement of right knee joint   3. History of total right knee replacement   4. Failed back surgical syndrome   5. Post laminectomy syndrome   6. Chronic pain syndrome   7. Chronic use of opiate for therapeutic purpose   8. Pharmacologic therapy        Plan of Care     Perry Jones has a current medication list which includes  the following long-term medication(s): benazepril-hydrochlorthiazide, loratadine, [START ON 01/25/2023] hydrocodone-acetaminophen, [START ON 02/24/2023] hydrocodone-acetaminophen, and [START ON 03/26/2023] hydrocodone-acetaminophen.  Pharmacotherapy (Medications Ordered): Meds ordered this encounter  Medications   HYDROcodone-acetaminophen (NORCO) 10-325 MG tablet    Sig: Take 1 tablet by mouth every 8 (eight) hours as needed for severe pain. Must last 30 days.    Dispense:  90 tablet    Refill:  0    Chronic Pain: STOP Act (Not applicable) Fill 1 day early if closed on refill date. Avoid benzodiazepines within 8 hours of opioids   HYDROcodone-acetaminophen (NORCO) 10-325 MG tablet    Sig: Take 1 tablet by mouth every 8 (eight) hours as needed for severe pain. Must last 30 days.    Dispense:  90 tablet    Refill:  0    Chronic Pain: STOP Act (Not applicable) Fill 1 day early if closed on refill date. Avoid benzodiazepines within 8 hours of opioids   HYDROcodone-acetaminophen (NORCO) 10-325 MG tablet    Sig: Take 1 tablet by mouth every 8 (eight) hours as needed for severe pain. Must last 30 days.    Dispense:  90 tablet    Refill:  0    Chronic Pain: STOP Act (Not applicable) Fill 1 day early if closed on refill date. Avoid benzodiazepines within 8 hours of opioids  Unfortunately not as much benefit with R GN RFA as we were hoping for, patient states that his right knee pain is worsening He is having to take 2-3 hydrocodone's a day to help Is on Lyrica 75 mg TID (managed neurology) Continue to utilize spinal cord stimulator   Follow-up plan:   Return in about 3 months (around 04/20/2023) for Medication Management, in person.        Recent Visits Date Type Provider Dept  12/20/22 Procedure visit Edward Jolly, MD [facility]  11/28/22 Office Visit Edward Jolly, MD [facility]  11/01/22 Procedure visit Edward Jolly, MD [facility]  Showing recent visits  within past 90 days and meeting all other requirements Today's Visits Date Type Provider Dept  01/18/23 Office  Visit Edward Jolly, MD [facility]  Showing today's visits and meeting all other requirements Future Appointments No visits were found meeting these conditions. Showing future appointments within next 90 days and meeting all other requirements  I discussed the assessment and treatment plan with the patient. The patient was provided an opportunity to ask questions and all were answered. The patient agreed with the plan and demonstrated an understanding of the instructions.  Patient advised to call back or seek an in-person evaluation if the symptoms or condition worsens.  Duration of encounter: .  Note by: Edward Jolly, MD Date: 01/18/2023; Time: 9:04 AM

## 2023-01-18 NOTE — Progress Notes (Signed)
Nursing Pain Medication Assessment:  Safety precautions to be maintained throughout the outpatient stay will include: orient to surroundings, keep bed in low position, maintain call bell within reach at all times, provide assistance with transfer out of bed and ambulation.  Medication Inspection Compliance: Pill count conducted under aseptic conditions, in front of the patient. Neither the pills nor the bottle was removed from the patient's sight at any time. Once count was completed pills were immediately returned to the patient in their original bottle.  Medication: Hydrocodone/APAP Pill/Patch Count:  25 of 75 pills remain Pill/Patch Appearance: Markings consistent with prescribed medication Bottle Appearance: Standard pharmacy container. Clearly labeled. Filled Date: 7 / 2 / 2024 Last Medication intake:  Today

## 2023-03-15 ENCOUNTER — Ambulatory Visit
Payer: Medicare HMO | Attending: Student in an Organized Health Care Education/Training Program | Admitting: Student in an Organized Health Care Education/Training Program

## 2023-03-15 ENCOUNTER — Encounter: Payer: Self-pay | Admitting: Student in an Organized Health Care Education/Training Program

## 2023-03-15 VITALS — BP 169/69 | HR 64 | Temp 97.5°F | Ht 72.0 in | Wt 232.0 lb

## 2023-03-15 DIAGNOSIS — Z79899 Other long term (current) drug therapy: Secondary | ICD-10-CM | POA: Insufficient documentation

## 2023-03-15 DIAGNOSIS — G8929 Other chronic pain: Secondary | ICD-10-CM | POA: Insufficient documentation

## 2023-03-15 DIAGNOSIS — M5416 Radiculopathy, lumbar region: Secondary | ICD-10-CM | POA: Insufficient documentation

## 2023-03-15 DIAGNOSIS — Z79891 Long term (current) use of opiate analgesic: Secondary | ICD-10-CM | POA: Insufficient documentation

## 2023-03-15 DIAGNOSIS — G894 Chronic pain syndrome: Secondary | ICD-10-CM | POA: Insufficient documentation

## 2023-03-15 MED ORDER — HYDROCODONE-ACETAMINOPHEN 10-325 MG PO TABS: 1.0000 | ORAL_TABLET | Freq: Four times a day (QID) | ORAL | 0 refills | Status: DC | PRN

## 2023-03-15 MED ORDER — HYDROCODONE-ACETAMINOPHEN 10-325 MG PO TABS: 1.0000 | ORAL_TABLET | Freq: Three times a day (TID) | ORAL | 0 refills | Status: DC | PRN

## 2023-03-15 NOTE — Progress Notes (Signed)
PROVIDER NOTE: Information contained herein reflects review and annotations entered in association with encounter. Interpretation of such information and data should be left to medically-trained personnel. Information provided to patient can be located elsewhere in the medical record under "Patient Instructions". Document created using STT-dictation technology, any transcriptional errors that may result from process are unintentional.    Patient: Perry Jones  Service Category: E/M  Provider: Edward Jolly, MD  DOB: [birth year]  DOS: 03/15/2023  Specialty: Interventional Pain Management  MRN: [id number]  Setting: Ambulatory outpatient  PCP: Perry Baseman, MD  Type: Established Patient    Referring Provider: Dorothey Baseman, MD  Location: Office  Delivery: Face-to-face     HPI  [name], a 72 y.o. year old male, is here today because of his Chronic radicular lumbar pain [M54.16, G89.29]. [name] primary complain today is Back Pain (left)  Last encounter: My last encounter with him was on 12/20/22  Pertinent problems: [name] has Back muscle spasm; Chronic knee pain after total replacement of right knee joint; DDD (degenerative disc disease), lumbar; Essential hypertension; Chronic radicular lumbar pain; History of total right knee replacement; History of lumbar fusion (L1-L3); Chronic bilateral low back pain with bilateral sciatica; Chronic pain syndrome; Lumbar facet arthropathy; and Piriformis syndrome, right on their pertinent problem list. Pain Assessment: Severity of Chronic pain is reported as a 9 /10. Location: Back Left/Denies. Onset: More than a month ago. Quality: Stabbing. Timing: Intermittent. Modifying factor(s): Meds. Vitals:  height is 6' (1.829 m) and weight is 232 lb (105.2 kg). His temperature is 97.5 F (36.4 C) (abnormal). His blood pressure is 169/69 (abnormal) and his pulse is 64. His oxygen saturation is 98%.   Reason for encounter: both, medication  management and post-procedure assessment.  Patient is experiencing acute on chronic low back pain.  He states that he attempted to hang blinds at the beginning of this month and that really aggravated his right shoulder and his low back.  He is having some radiation into his left hip at times.  He did receive a trigger point injection for his right shoulder pain.  He states that his back is still persistent.  He has been taking hydrocodone 10 mg every 6 hours.  He states that he will need an earlier refill given the acute on chronic pain that he is experiencing.  Pharmacotherapy Assessment  Analgesic: hydrocodone 10 mg every 8 hours as needed prn    Monitoring: [place] PMP: PDMP reviewed during this encounter.       Pharmacotherapy: No side-effects or adverse reactions reported. Compliance: No problems identified. Effectiveness: Clinically acceptable.  Brigitte Pulse, RN  03/15/2023  9:38 AM  Sign when Signing Visit Nursing Pain Medication Assessment:  Safety precautions to be maintained throughout the outpatient stay will include: orient to surroundings, keep bed in low position, maintain call bell within reach at all times, provide assistance with transfer out of bed and ambulation.  Medication Inspection Compliance: Pill count conducted under aseptic conditions, in front of the patient. Neither the pills nor the bottle was removed from the patient's sight at any time. Once count was completed pills were immediately returned to the patient in their original bottle.  Medication: Hydrocodone/APAP Pill/Patch Count:  31 1/2 of 90 pills remain Pill/Patch Appearance: Markings consistent with prescribed medication Bottle Appearance: Standard pharmacy container. Clearly labeled. Filled Date: 8 / 35 / 2024 Last Medication intake:  TodaySafety precautions to be maintained throughout the outpatient stay will include: orient to  surroundings, keep bed in low position, maintain call bell within reach at all  times, provide assistance with transfer out of bed and ambulation.      UDS:  Summary  Date Value Ref Range Status  10/10/2022 Note  Final    Comment:    ==================================================================== [employer] (MW) ==================================================================== Test                             Result       Flag       Units  Drug Present and Declared for Prescription Verification   Hydrocodone                    1673         EXPECTED   ng/mg creat   Hydromorphone                  663          EXPECTED   ng/mg creat   Dihydrocodeine                 137          EXPECTED   ng/mg creat   Norhydrocodone                 2245         EXPECTED   ng/mg creat    Sources of hydrocodone include scheduled prescription medications.    Hydromorphone, dihydrocodeine and norhydrocodone are expected    metabolites of hydrocodone. Hydromorphone and dihydrocodeine are    also available as scheduled prescription medications.  Drug Absent but Declared for Prescription Verification   Lorazepam                      Not Detected UNEXPECTED ng/mg creat ==================================================================== Test                      Result    Flag   Units      Ref Range   Creatinine              101              mg/dL      >=16 ==================================================================== Declared Medications:  The flagging and interpretation on this report are based on the  following declared medications.  Unexpected results may arise from  inaccuracies in the declared medications.   **Note: The testing scope of this panel includes these medications:   Hydrocodone (Norco)  Lorazepam (Ativan)   **Note: The testing scope of this panel does not include the  following reported medications:   Acetaminophen (Norco)  Benazepril (Lotensin)  Biotin  Calcium  Cyclobenzaprine (Flexeril)  Diclofenac (Voltaren)  Docusate (Colace)   Ezetimibe (Zetia)  Loratadine  Multivitamin  Naproxen (Naprosyn)  Pregabalin (Lyrica)  Supplement  Vitamin C  Zolpidem (Ambien) ==================================================================== For clinical consultation, please call [phone]. ====================================================================      ROS  Constitutional: wnl Gastrointestinal: No reported hemesis, hematochezia, vomiting, or acute GI distress Musculoskeletal: Low back and right shoulder pain Neurological: No reported episodes of acute onset apraxia, aphasia, dysarthria, agnosia, amnesia, paralysis, loss of coordination, or loss of consciousness  Medication Review  Biotin, Calcium Polycarbophil, Echinacea, HYDROcodone-acetaminophen, LORazepam, Loratadine, Multi-Vitamins, Vitamin C, benazepril-hydrochlorthiazide, cyclobenzaprine, diclofenac Sodium, docusate sodium, and naproxen  History Review  Allergy: [name] is allergic to gluten meal, pine tar, pistachio nut (diagnostic), pistachio  nut extract, and prednisone. Drug: [name]  reports no history of drug use. Alcohol:  reports current alcohol use. Tobacco:  reports that he quit smoking about 38 years ago. His smoking use included cigarettes. He started smoking about 56 years ago. He has a 36 pack-year smoking history. He has never used smokeless tobacco. Social: [name]  reports that he quit smoking about 38 years ago. His smoking use included cigarettes. He started smoking about 56 years ago. He has a 36 pack-year smoking history. He has never used smokeless tobacco. He reports current alcohol use. He reports that he does not use drugs. Medical:  has a past medical history of Actinic keratosis, Dysplastic nevus (03/11/2020), Dysplastic nevus (03/12/2019), High cholesterol, Hypertension, and Sinus trouble. Surgical: [name]  has a past surgical history that includes Knee arthroscopy (Right); Colonoscopy with propofol (N/A, 11/22/2017);  Cholecystectomy; Lumbar fusion (N/A, 2000); Skin cancer excision; Replacement total knee (Right, 2007); Spinal cord stimulator insertion (N/A, 02/21/2021); and Spinal cord stimulator implant (02/21/2021). Family: family history includes Dementia in his mother; Heart Problems in his mother; Lung cancer in his father.  Laboratory Chemistry Profile   Renal Lab Results  Component Value Date   BUN 12 02/10/2021   CREATININE 0.72 02/10/2021   GFRAA >60 07/14/2018   GFRNONAA >60 02/10/2021    Hepatic Lab Results  Component Value Date   AST 45 (H) 09/28/2020   ALT 37 09/28/2020   ALBUMIN 4.5 09/28/2020   ALKPHOS 44 09/28/2020   LIPASE 28 07/14/2018    Electrolytes Lab Results  Component Value Date   NA 139 02/10/2021   K 3.5 02/10/2021   CL 105 02/10/2021   CALCIUM 9.6 02/10/2021    Bone Lab Results  Component Value Date   TESTOSTERONE 45 (L) 09/28/2020    Inflammation (CRP: Acute Phase) (ESR: Chronic Phase) No results found for: "CRP", "ESRSEDRATE", "LATICACIDVEN"       Note: Above Lab results reviewed.  Recent Imaging Review  DG PAIN CLINIC C-ARM 1-60 MIN NO REPORT Fluoro was used, but no Radiologist interpretation will be provided.  Please refer to "NOTES" tab for provider progress note.  Note: Reviewed        Physical Exam  General appearance: Well nourished, well developed, and well hydrated. In no apparent acute distress Mental status: Alert, oriented x 3 (person, place, & time)       Respiratory: No evidence of acute respiratory distress Eyes: PERLA Vitals: BP (!) 169/69   Pulse 64   Temp (!) 97.5 F (36.4 C)   Ht 6' (1.829 m)   Wt 232 lb (105.2 kg)   SpO2 98%   BMI 31.46 kg/m  BMI: Estimated body mass index is 31.46 kg/m as calculated from the following:   Height as of this encounter: 6' (1.829 m).   Weight as of this encounter: 232 lb (105.2 kg). Ideal: Ideal body weight: 77.6 kg (171 lb 1.2 oz) Adjusted ideal body weight: 88.7 kg (195 lb 7.1  oz)    Assessment   Diagnosis  1. Chronic radicular lumbar pain   2. Chronic pain syndrome   3. Chronic use of opiate for therapeutic purpose   4. Pharmacologic therapy        Plan of Care     [name] has a current medication list which includes the following long-term medication(s): benazepril-hydrochlorthiazide, loratadine, [START ON 03/22/2023] hydrocodone-acetaminophen, [START ON 04/21/2023] hydrocodone-acetaminophen, and [START ON 05/21/2023] hydrocodone-acetaminophen.  Pharmacotherapy (Medications Ordered): Meds ordered this encounter  Medications   HYDROcodone-acetaminophen (NORCO) 10-325 MG tablet    Sig: Take 1 tablet by mouth every 6 (six) hours as needed for severe pain. Must last 30 days.    Dispense:  120 tablet    Refill:  0    Chronic Pain: STOP Act (Not applicable) Fill 1 day early if closed on refill date. Avoid benzodiazepines within 8 hours of opioids   HYDROcodone-acetaminophen (NORCO) 10-325 MG tablet    Sig: Take 1 tablet by mouth every 8 (eight) hours as needed for severe pain. Must last 30 days.    Dispense:  90 tablet    Refill:  0    Chronic Pain: STOP Act (Not applicable) Fill 1 day early if closed on refill date. Avoid benzodiazepines within 8 hours of opioids   HYDROcodone-acetaminophen (NORCO) 10-325 MG tablet    Sig: Take 1 tablet by mouth every 8 (eight) hours as needed for severe pain. Must last 30 days.    Dispense:  90 tablet    Refill:  0    Chronic Pain: STOP Act (Not applicable) Fill 1 day early if closed on refill date. Avoid benzodiazepines within 8 hours of opioids     Follow-up plan:   Return in about 3 months (around 06/14/2023) for MM, F2F.        Recent Visits Date Type Provider Dept  01/18/23 Office Visit Edward Jolly, MD [facility]  12/20/22 Procedure visit Edward Jolly, MD [facility]  Showing recent visits within past 90 days and meeting all other requirements Today's Visits Date  Type Provider Dept  03/15/23 Office Visit Edward Jolly, MD [facility]  Showing today's visits and meeting all other requirements Future Appointments Date Type Provider Dept  06/12/23 Appointment Edward Jolly, MD [facility]  Showing future appointments within next 90 days and meeting all other requirements  I discussed the assessment and treatment plan with the patient. The patient was provided an opportunity to ask questions and all were answered. The patient agreed with the plan and demonstrated an understanding of the instructions.  Patient advised to call back or seek an in-person evaluation if the symptoms or condition worsens.  Duration of encounter: .  Note by: Edward Jolly, MD Date: 03/15/2023; Time: 10:45 AM

## 2023-03-15 NOTE — Progress Notes (Signed)
Nursing Pain Medication Assessment:  Safety precautions to be maintained throughout the outpatient stay will include: orient to surroundings, keep bed in low position, maintain call bell within reach at all times, provide assistance with transfer out of bed and ambulation.  Medication Inspection Compliance: Pill count conducted under aseptic conditions, in front of the patient. Neither the pills nor the bottle was removed from the patient's sight at any time. Once count was completed pills were immediately returned to the patient in their original bottle.  Medication: Hydrocodone/APAP Pill/Patch Count:  31 1/2 of 90 pills remain Pill/Patch Appearance: Markings consistent with prescribed medication Bottle Appearance: Standard pharmacy container. Clearly labeled. Filled Date: 8 / 37 / 2024 Last Medication intake:  TodaySafety precautions to be maintained throughout the outpatient stay will include: orient to surroundings, keep bed in low position, maintain call bell within reach at all times, provide assistance with transfer out of bed and ambulation.

## 2023-03-15 NOTE — Patient Instructions (Signed)
Call Pharmacy and cancel 9/30 Rx

## 2023-04-10 ENCOUNTER — Encounter: Payer: Medicare HMO | Admitting: Student in an Organized Health Care Education/Training Program

## 2023-04-12 ENCOUNTER — Ambulatory Visit: Payer: Medicare HMO | Admitting: Dermatology

## 2023-04-12 VITALS — BP 139/81 | HR 68

## 2023-04-12 DIAGNOSIS — Z1283 Encounter for screening for malignant neoplasm of skin: Secondary | ICD-10-CM | POA: Diagnosis not present

## 2023-04-12 DIAGNOSIS — D1801 Hemangioma of skin and subcutaneous tissue: Secondary | ICD-10-CM

## 2023-04-12 DIAGNOSIS — L814 Other melanin hyperpigmentation: Secondary | ICD-10-CM | POA: Diagnosis not present

## 2023-04-12 DIAGNOSIS — W908XXA Exposure to other nonionizing radiation, initial encounter: Secondary | ICD-10-CM | POA: Diagnosis not present

## 2023-04-12 DIAGNOSIS — D692 Other nonthrombocytopenic purpura: Secondary | ICD-10-CM

## 2023-04-12 DIAGNOSIS — L578 Other skin changes due to chronic exposure to nonionizing radiation: Secondary | ICD-10-CM

## 2023-04-12 DIAGNOSIS — Z86018 Personal history of other benign neoplasm: Secondary | ICD-10-CM

## 2023-04-12 DIAGNOSIS — L821 Other seborrheic keratosis: Secondary | ICD-10-CM

## 2023-04-12 DIAGNOSIS — Z79899 Other long term (current) drug therapy: Secondary | ICD-10-CM

## 2023-04-12 DIAGNOSIS — D229 Melanocytic nevi, unspecified: Secondary | ICD-10-CM

## 2023-04-12 DIAGNOSIS — L219 Seborrheic dermatitis, unspecified: Secondary | ICD-10-CM

## 2023-04-12 MED ORDER — KETOCONAZOLE 2 % EX SHAM: MEDICATED_SHAMPOO | CUTANEOUS | 6 refills | Status: DC

## 2023-04-12 NOTE — Progress Notes (Signed)
Follow-Up Visit   Subjective  Perry Jones is a 72 y.o. male who presents for the following: Skin Cancer Screening and Full Body Skin Exam  The patient presents for Total-Body Skin Exam (TBSE) for skin cancer screening and mole check. The patient has spots, moles and lesions to be evaluated, some may be new or changing and the patient may have concern these could be cancer.   The following portions of the chart were reviewed this encounter and updated as appropriate: medications, allergies, medical history  Review of Systems:  No other skin or systemic complaints except as noted in HPI or Assessment and Plan.  Objective  Well appearing patient in no apparent distress; mood and affect are within normal limits.  A full examination was performed including scalp, head, eyes, ears, nose, lips, neck, chest, axillae, abdomen, back, buttocks, bilateral upper extremities, bilateral lower extremities, hands, feet, fingers, toes, fingernails, and toenails. All findings within normal limits unless otherwise noted below.   Relevant physical exam findings are noted in the Assessment and Plan.   Assessment & Plan   SKIN CANCER SCREENING PERFORMED TODAY.  ACTINIC DAMAGE - Chronic condition, secondary to cumulative UV/sun exposure - diffuse scaly erythematous macules with underlying dyspigmentation - Recommend daily broad spectrum sunscreen SPF 30+ to sun-exposed areas, reapply every 2 hours as needed.  - Staying in the shade or wearing long sleeves, sun glasses (UVA+UVB protection) and wide brim hats (4-inch brim around the entire circumference of the hat) are also recommended for sun protection.  - Call for new or changing lesions.  LENTIGINES, SEBORRHEIC KERATOSES, HEMANGIOMAS - Benign normal skin lesions - Benign-appearing - Call for any changes  MELANOCYTIC NEVI - Tan-brown and/or pink-flesh-colored symmetric macules and papules - Benign appearing on exam today - Observation - Call  clinic for new or changing moles - Recommend daily use of broad spectrum spf 30+ sunscreen to sun-exposed areas.   HISTORY OF DYSPLASTIC NEVUS No evidence of recurrence today Recommend regular full body skin exams Recommend daily broad spectrum sunscreen SPF 30+ to sun-exposed areas, reapply every 2 hours as needed.  Call if any new or changing lesions are noted between office visits  SEBORRHEIC DERMATITIS Exam: Pink patches with greasy scale at the scalp  Chronic condition with duration or expected duration over one year. Currently well-controlled.   Seborrheic Dermatitis is a chronic persistent rash characterized by pinkness and scaling most commonly of the mid face but also can occur on the scalp (dandruff), ears; mid chest, mid back and groin.  It tends to be exacerbated by stress and cooler weather.  People who have neurologic disease may experience new onset or exacerbation of existing seborrheic dermatitis.  The condition is not curable but treatable and can be controlled.  Treatment Plan: Continue Ketoconazole 2% shampoo 2-3d/wk.   HISTORY OF DYSPLASTIC NEVUS No evidence of recurrence today Recommend regular full body skin exams Recommend daily broad spectrum sunscreen SPF 30+ to sun-exposed areas, reapply every 2 hours as needed.  Call if any new or changing lesions are noted between office visits  Purpura - Chronic; persistent and recurrent.  Treatable, but not curable. - Violaceous macules and patches - Benign - Related to trauma, age, sun damage and/or use of blood thinners, chronic use of topical and/or oral steroids - Observe - Can use OTC arnica containing moisturizer such as Dermend Bruise Formula if desired - Call for worsening or other concerns  Return for TBSE in 1-2 years.  Maylene Roes, CMA,  am acting as scribe for Armida Sans, MD .   Documentation: I have reviewed the above documentation for accuracy and completeness, and I agree with the  above.  Armida Sans, MD

## 2023-04-12 NOTE — Patient Instructions (Signed)

## 2023-04-16 ENCOUNTER — Other Ambulatory Visit: Payer: Self-pay

## 2023-04-23 ENCOUNTER — Encounter
Admission: RE | Disposition: A | Discharge status: Home / Self Care | Payer: Self-pay | Source: Home / Self Care | Attending: Gastroenterology

## 2023-04-23 ENCOUNTER — Ambulatory Visit
Admission: RE | Admit: 2023-04-23 | Discharge: 2023-04-23 | Disposition: A | Discharge status: Home / Self Care | Payer: Medicare HMO | Attending: Gastroenterology | Admitting: Gastroenterology

## 2023-04-23 ENCOUNTER — Ambulatory Visit: Payer: Medicare HMO | Admitting: Certified Registered"

## 2023-04-23 ENCOUNTER — Encounter: Payer: Self-pay | Admitting: *Deleted

## 2023-04-23 DIAGNOSIS — D127 Benign neoplasm of rectosigmoid junction: Secondary | ICD-10-CM | POA: Diagnosis not present

## 2023-04-23 DIAGNOSIS — Z1211 Encounter for screening for malignant neoplasm of colon: Secondary | ICD-10-CM | POA: Diagnosis present

## 2023-04-23 DIAGNOSIS — I10 Essential (primary) hypertension: Secondary | ICD-10-CM | POA: Diagnosis not present

## 2023-04-23 DIAGNOSIS — M1990 Unspecified osteoarthritis, unspecified site: Secondary | ICD-10-CM | POA: Diagnosis not present

## 2023-04-23 DIAGNOSIS — G8929 Other chronic pain: Secondary | ICD-10-CM | POA: Diagnosis not present

## 2023-04-23 DIAGNOSIS — K641 Second degree hemorrhoids: Secondary | ICD-10-CM | POA: Diagnosis not present

## 2023-04-23 DIAGNOSIS — E7800 Pure hypercholesterolemia, unspecified: Secondary | ICD-10-CM | POA: Insufficient documentation

## 2023-04-23 DIAGNOSIS — Z79899 Other long term (current) drug therapy: Secondary | ICD-10-CM | POA: Diagnosis not present

## 2023-04-23 DIAGNOSIS — Z87891 Personal history of nicotine dependence: Secondary | ICD-10-CM | POA: Diagnosis not present

## 2023-04-23 DIAGNOSIS — Z860101 Personal history of adenomatous and serrated colon polyps: Secondary | ICD-10-CM | POA: Insufficient documentation

## 2023-04-23 DIAGNOSIS — K5730 Diverticulosis of large intestine without perforation or abscess without bleeding: Secondary | ICD-10-CM | POA: Insufficient documentation

## 2023-04-23 DIAGNOSIS — Z791 Long term (current) use of non-steroidal anti-inflammatories (NSAID): Secondary | ICD-10-CM | POA: Diagnosis not present

## 2023-04-23 DIAGNOSIS — E785 Hyperlipidemia, unspecified: Secondary | ICD-10-CM | POA: Diagnosis not present

## 2023-04-23 HISTORY — PX: POLYPECTOMY: SHX5525

## 2023-04-23 HISTORY — PX: COLONOSCOPY WITH PROPOFOL: SHX5780

## 2023-04-23 SURGERY — COLONOSCOPY WITH PROPOFOL
Anesthesia: General

## 2023-04-23 MED ORDER — SODIUM CHLORIDE 0.9 % IV SOLN
INTRAVENOUS | Status: DC
  Administered 2023-04-23: 20 mL/h via INTRAVENOUS

## 2023-04-23 MED ORDER — PROPOFOL 500 MG/50ML IV EMUL
INTRAVENOUS | Status: DC | PRN
  Administered 2023-04-23: 165 ug/kg/min via INTRAVENOUS

## 2023-04-23 MED ORDER — LIDOCAINE HCL (CARDIAC) PF 100 MG/5ML IV SOSY
PREFILLED_SYRINGE | INTRAVENOUS | Status: DC | PRN
  Administered 2023-04-23: 100 mg via INTRAVENOUS

## 2023-04-23 MED ORDER — PROPOFOL 10 MG/ML IV BOLUS
INTRAVENOUS | Status: DC | PRN
  Administered 2023-04-23: 40 mg via INTRAVENOUS
  Administered 2023-04-23: 70 mg via INTRAVENOUS
  Administered 2023-04-23: 30 mg via INTRAVENOUS

## 2023-04-23 NOTE — Interval H&P Note (Signed)
History and Physical Interval Note:  04/23/2023 8:50 AM  Perry Jones  has presented today for surgery, with the diagnosis of h/o colon polyps.  The various methods of treatment have been discussed with the patient and family. After consideration of risks, benefits and other options for treatment, the patient has consented to  Procedure(s): COLONOSCOPY WITH PROPOFOL (N/A) as a surgical intervention.  The patient's history has been reviewed, patient examined, no change in status, stable for surgery.  I have reviewed the patient's chart and labs.  Questions were answered to the patient's satisfaction.     Regis Bill  Ok to proceed with colonoscopy

## 2023-04-23 NOTE — Op Note (Signed)
[facility] Patient Name: Perry Jones Procedure Date: 04/23/2023 8:52 AM MRN: [id number] Account #: [id number] Date of Birth: Dec 08, 1950 Admit Type: Outpatient Age: 72 Room: [facility] ENDO ROOM 3 Gender: Male Note Status: Finalized Instrument Name: Prentice Docker [id number] Procedure:             Colonoscopy Indications:           Surveillance: Personal history of adenomatous polyps                         on last colonoscopy 5 years ago Providers:             Eather Colas MD, MD Medicines:             Monitored Anesthesia Care Complications:         No immediate complications. Estimated blood loss:                         Minimal. Procedure:             Pre-Anesthesia Assessment:                        - Prior to the procedure, a History and Physical was                         performed, and patient medications and allergies were                         reviewed. The patient is competent. The risks and                         benefits of the procedure and the sedation options and                         risks were discussed with the patient. All questions                         were answered and informed consent was obtained.                         Patient identification and proposed procedure were                         verified by the physician, the nurse, the                         anesthesiologist, the anesthetist and the technician                         in the endoscopy suite. Mental Status Examination:                         alert and oriented. Airway Examination: normal                         oropharyngeal airway and neck mobility. Respiratory                         Examination: clear to auscultation. CV Examination:  normal. Prophylactic Antibiotics: The patient does not                         require prophylactic antibiotics. Prior                         Anticoagulants: The patient has taken no anticoagulant                          or antiplatelet agents. ASA Grade Assessment: II - A                         patient with mild systemic disease. After reviewing                         the risks and benefits, the patient was deemed in                         satisfactory condition to undergo the procedure. The                         anesthesia plan was to use monitored anesthesia care                         (MAC). Immediately prior to administration of                         medications, the patient was re-assessed for adequacy                         to receive sedatives. The heart rate, respiratory                         rate, oxygen saturations, blood pressure, adequacy of                         pulmonary ventilation, and response to care were                         monitored throughout the procedure. The physical                         status of the patient was re-assessed after the                         procedure.                        After obtaining informed consent, the colonoscope was                         passed under direct vision. Throughout the procedure,                         the patient's blood pressure, pulse, and oxygen                         saturations were monitored continuously. The  Colonoscope was introduced through the anus and                         advanced to the the cecum, identified by appendiceal                         orifice and ileocecal valve. The colonoscopy was                         performed without difficulty. The patient tolerated                         the procedure well. The quality of the bowel                         preparation was good. The ileocecal valve, appendiceal                         orifice, and rectum were photographed. Findings:      The perianal and digital rectal examinations were normal.      Multiple small-mouthed diverticula were found in the sigmoid colon.      Two hyperplastic polyps were  found in the recto-sigmoid colon. The       polyps were 1 mm in size. These polyps were removed with a jumbo cold       forceps. Resection and retrieval were complete. Estimated blood loss was       minimal.      Internal hemorrhoids were found during retroflexion. The hemorrhoids       were Grade II (internal hemorrhoids that prolapse but reduce       spontaneously).      The exam was otherwise without abnormality on direct and retroflexion       views. Impression:            - Diverticulosis in the sigmoid colon.                        - Two 1 mm polyps at the recto-sigmoid colon, removed                         with a jumbo cold forceps. Resected and retrieved.                        - Internal hemorrhoids.                        - The examination was otherwise normal on direct and                         retroflexion views. Recommendation:        - Discharge patient to home.                        - Resume previous diet.                        - Continue present medications.                        - Await pathology results.                        -  Repeat colonoscopy in 10 years for surveillance.                        - Return to referring physician as previously                         scheduled. Procedure Code(s):     --- Professional ---                        [phone], Colonoscopy, flexible; with biopsy, single or                         multiple Diagnosis Code(s):     --- Professional ---                        Z86.010, Personal history of colonic polyps                        K64.1, Second degree hemorrhoids                        D12.7, Benign neoplasm of rectosigmoid junction                        K57.30, Diverticulosis of large intestine without                         perforation or abscess without bleeding CPT copyright 2022 [employer]. All rights reserved. The codes documented in this report are preliminary and upon coder review may  be revised to  meet current compliance requirements. Eather Colas MD, MD 04/23/2023 9:25:27 AM Number of Addenda: 0 Note Initiated On: 04/23/2023 8:52 AM Scope Withdrawal Time: 0 hours 9 minutes 39 seconds  Total Procedure Duration: 0 hours 20 minutes 47 seconds  Estimated Blood Loss:  Estimated blood loss was minimal.      [facility]

## 2023-04-23 NOTE — Anesthesia Procedure Notes (Signed)
Procedure Name: General with mask airway Date/Time: 04/23/2023 8:54 AM  Performed by: Mohammed Kindle, CRNAPre-anesthesia Checklist: Patient identified, Emergency Drugs available, Suction available and Patient being monitored Patient Re-evaluated:Patient Re-evaluated prior to induction Oxygen Delivery Method: Simple face mask Induction Type: IV induction Placement Confirmation: positive ETCO2, CO2 detector and breath sounds checked- equal and bilateral Dental Injury: Teeth and Oropharynx as per pre-operative assessment

## 2023-04-23 NOTE — Transfer of Care (Signed)
Immediate Anesthesia Transfer of Care Note  Patient: Perry Jones  Procedure(s) Performed: COLONOSCOPY WITH PROPOFOL  Patient Location: Endoscopy Unit  Anesthesia Type:General  Level of Consciousness: drowsy and patient cooperative  Airway & Oxygen Therapy: Patient Spontanous Breathing and Patient connected to face mask oxygen  Post-op Assessment: Report given to RN and Post -op Vital signs reviewed and stable  Post vital signs: Reviewed and stable  Last Vitals:  Vitals Value Taken Time  BP 104/67 04/23/23 0921  Temp 36 C 04/23/23 0920  Pulse 68 04/23/23 0923  Resp 11 04/23/23 0923  SpO2 99 % 04/23/23 0923  Vitals shown include unfiled device data.  Last Pain:  Vitals:   04/23/23 0920  TempSrc: Temporal  PainSc: Asleep         Complications: No notable events documented.

## 2023-04-23 NOTE — H&P (Signed)
Outpatient short stay form Pre-procedure 04/23/2023  Regis Bill, MD  Primary Physician: Dorothey Baseman, MD  Reason for visit:  Surveillance colonoscopy  History of present illness:    72 y/o gentleman with chronic pain on chronic opioids, hypertension, and HLD here for surveillance colonoscopy. Last colonoscopy in 2019 with small Ta's and SSA's. No blood thinners. No family history of GI malignancies. History of cholecystectomy.    Current Facility-Administered Medications:    0.9 %  sodium chloride infusion, , Intravenous, Continuous, Keyondre Hepburn, Rossie Muskrat, MD, Last Rate: 20 mL/hr at 04/23/23 0812, 20 mL/hr at 04/23/23 4098  Medications Prior to Admission  Medication Sig Dispense Refill Last Dose   Ascorbic Acid (VITAMIN C) 500 MG CHEW Chew 500 mg by mouth daily.   Past Week   benazepril-hydrochlorthiazide (LOTENSIN HCT) 20-12.5 MG tablet Take 1 tablet by mouth in the morning and at bedtime.   04/22/2023   Calcium Polycarbophil (FIBER-CAPS PO) Take 1 capsule by mouth in the morning, at noon, and at bedtime.   Past Week   cyclobenzaprine (FLEXERIL) 10 MG tablet Take 10 mg by mouth 3 (three) times daily as needed for muscle spasms.   04/22/2023   diclofenac Sodium (VOLTAREN) 1 % GEL Apply 2 g topically daily as needed (pain).   04/22/2023   docusate sodium (COLACE) 100 MG capsule Take 100 mg by mouth daily.   04/22/2023   HYDROcodone-acetaminophen (NORCO) 10-325 MG tablet Take 1 tablet by mouth every 8 (eight) hours as needed for severe pain. Must last 30 days. 90 tablet 0 04/23/2023 at 0545   ketoconazole (NIZORAL) 2 % shampoo Shampoo into the scalp, let sit 5-10 minutes then wash out. Use 3 days a week. 120 mL 6 04/22/2023   Loratadine 10 MG CAPS Take 10 mg by mouth daily.   04/22/2023   LORazepam (ATIVAN) 0.5 MG tablet Take by mouth.   Past Week   Multiple Vitamin (MULTI-VITAMINS) TABS Take 1 tablet by mouth daily.   Past Week   naproxen (NAPROSYN) 500 MG tablet Take 500 mg by  mouth 2 (two) times daily with a meal.   Past Week   pregabalin (LYRICA) 100 MG capsule Take 1 capsule by mouth 3 (three) times daily.   04/23/2023 at 0545   Biotin 11914 MCG TABS Take 10,000 mcg by mouth daily.      Echinacea 350 MG CAPS Take 1 capsule by mouth daily.      HYDROcodone-acetaminophen (NORCO) 10-325 MG tablet Take 1 tablet by mouth every 6 (six) hours as needed for severe pain. Must last 30 days. 120 tablet 0    [START ON 05/21/2023] HYDROcodone-acetaminophen (NORCO) 10-325 MG tablet Take 1 tablet by mouth every 8 (eight) hours as needed for severe pain. Must last 30 days. 90 tablet 0      Allergies  Allergen Reactions   Gluten Meal     Other reaction(s): Other (See Comments) Joint pain   Pine Tar     Other reaction(s): Other allergies allergies    Pistachio Nut (Diagnostic) Other (See Comments)    Pistachio, almonds, peanuts  Mouth sores    Pistachio Nut Extract Other (See Comments)    Pistachio, almonds, peanuts  Mouth sores   Prednisone Other (See Comments)    BLURRED VISION and HA with ALL steroids      Past Medical History:  Diagnosis Date   Actinic keratosis    Dysplastic nevus 03/11/2020   upper back spine - DYSPLASTIC COMPOUND NEVUS WITH MODERATE ATYPIA, CLOSE  TO MARGIN   Dysplastic nevus 03/12/2019   left mid back 4.0cm lat to spine - DYSPLASTIC COMPOUND NEVUS WITH MILD ATYPIA, DEEP MARGIN INVOLVED   High cholesterol    Hypertension    Sinus trouble     Review of systems:  Otherwise negative.    Physical Exam  Gen: Alert, oriented. Appears stated age.  HEENT: PERRLA. Lungs: No respiratory distress CV: RRR Abd: soft, benign, no masses Ext: No edema    Planned procedures: Proceed with colonoscopy. The patient understands the nature of the planned procedure, indications, risks, alternatives and potential complications including but not limited to bleeding, infection, perforation, damage to internal organs and possible oversedation/side  effects from anesthesia. The patient agrees and gives consent to proceed.  Please refer to procedure notes for findings, recommendations and patient disposition/instructions.     Regis Bill, MD [facility]

## 2023-04-23 NOTE — Anesthesia Preprocedure Evaluation (Signed)
Anesthesia Evaluation  Patient identified by MRN, date of birth, ID band Patient awake    Reviewed: Allergy & Precautions, NPO status , Patient's Chart, lab work & pertinent test results  History of Anesthesia Complications (+) history of anesthetic complications  Airway Mallampati: III  TM Distance: <3 FB Neck ROM: full    Dental  (+) Chipped, Dental Advidsory Given   Pulmonary neg shortness of breath, neg sleep apnea, neg COPD, neg recent URI, former smoker   Pulmonary exam normal        Cardiovascular hypertension, (-) angina (-) Past MI and (-) Cardiac Stents Normal cardiovascular exam(-) dysrhythmias (-) Valvular Problems/Murmurs     Neuro/Psych  Neuromuscular disease  negative psych ROS   GI/Hepatic negative GI ROS, Neg liver ROS,,,  Endo/Other  negative endocrine ROS    Renal/GU      Musculoskeletal  (+) Arthritis ,    Abdominal   Peds  Hematology negative hematology ROS (+)   Anesthesia Other Findings Past Medical History: 03/11/2020: Dysplastic nevus     Comment:  upper back spine - DYSPLASTIC COMPOUND NEVUS WITH               MODERATE ATYPIA, CLOSE TO MARGIN 03/12/2019: Dysplastic nevus     Comment:  left mid back 4.0cm lat to spine - DYSPLASTIC COMPOUND               NEVUS WITH MILD ATYPIA, DEEP MARGIN INVOLVED No date: High cholesterol No date: Hypertension No date: Sinus trouble  Past Surgical History: No date: CHOLECYSTECTOMY 11/22/2017: COLONOSCOPY WITH PROPOFOL; N/A     Comment:  Procedure: COLONOSCOPY WITH PROPOFOL;  Surgeon:               Christena Deem, MD;  Location: [facility] ENDOSCOPY;                Service: Endoscopy;  Laterality: N/A; No date: KNEE ARTHROSCOPY; Right 2000: LUMBAR FUSION; N/A     Comment:  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; Right No date: SKIN CANCER EXCISION     Comment:  Bx (+) BSC  BMI    Body Mass Index: 31.19 kg/m      Reproductive/Obstetrics negative  OB ROS                             Anesthesia Physical Anesthesia Plan  ASA: 2  Anesthesia Plan: General   Post-op Pain Management:    Induction: Intravenous  PONV Risk Score and Plan: 2 and Propofol infusion and TIVA  Airway Management Planned: Natural Airway and Nasal Cannula  Additional Equipment:   Intra-op Plan:   Post-operative Plan:   Informed Consent: I have reviewed the patients History and Physical, chart, labs and discussed the procedure including the risks, benefits and alternatives for the proposed anesthesia with the patient or authorized representative who has indicated his/her understanding and acceptance.     Dental Advisory Given  Plan Discussed with: Anesthesiologist, CRNA and Surgeon  Anesthesia Plan Comments:         Anesthesia Quick Evaluation

## 2023-04-24 ENCOUNTER — Encounter: Payer: Self-pay | Admitting: Gastroenterology

## 2023-04-24 LAB — SURGICAL PATHOLOGY

## 2023-04-30 NOTE — Anesthesia Postprocedure Evaluation (Signed)
Anesthesia Post Note  Patient: Perry Jones  Procedure(s) Performed: COLONOSCOPY WITH PROPOFOL POLYPECTOMY  Patient location during evaluation: Endoscopy Anesthesia Type: General Level of consciousness: awake and alert Pain management: pain level controlled Vital Signs Assessment: post-procedure vital signs reviewed and stable Respiratory status: spontaneous breathing, nonlabored ventilation, respiratory function stable and patient connected to nasal cannula oxygen Cardiovascular status: blood pressure returned to baseline and stable Postop Assessment: no apparent nausea or vomiting Anesthetic complications: no   No notable events documented.   Last Vitals:  Vitals:   04/23/23 0940 04/23/23 0950  BP:    Pulse:    Resp:    Temp:    SpO2: 96% 98%    Last Pain:  Vitals:   04/23/23 0950  TempSrc:   PainSc: 0-No pain                 Lenard Simmer

## 2023-06-12 ENCOUNTER — Encounter: Payer: Self-pay | Admitting: Student in an Organized Health Care Education/Training Program

## 2023-06-12 ENCOUNTER — Ambulatory Visit
Payer: Medicare HMO | Attending: Student in an Organized Health Care Education/Training Program | Admitting: Student in an Organized Health Care Education/Training Program

## 2023-06-12 VITALS — BP 119/79 | HR 77 | Temp 97.5°F | Ht 72.0 in | Wt 230.0 lb

## 2023-06-12 DIAGNOSIS — Z96651 Presence of right artificial knee joint: Secondary | ICD-10-CM

## 2023-06-12 DIAGNOSIS — M5416 Radiculopathy, lumbar region: Secondary | ICD-10-CM | POA: Insufficient documentation

## 2023-06-12 DIAGNOSIS — M47816 Spondylosis without myelopathy or radiculopathy, lumbar region: Secondary | ICD-10-CM | POA: Insufficient documentation

## 2023-06-12 DIAGNOSIS — G894 Chronic pain syndrome: Secondary | ICD-10-CM

## 2023-06-12 DIAGNOSIS — Z79891 Long term (current) use of opiate analgesic: Secondary | ICD-10-CM

## 2023-06-12 DIAGNOSIS — G8929 Other chronic pain: Secondary | ICD-10-CM | POA: Diagnosis present

## 2023-06-12 DIAGNOSIS — M961 Postlaminectomy syndrome, not elsewhere classified: Secondary | ICD-10-CM | POA: Diagnosis present

## 2023-06-12 DIAGNOSIS — M4726 Other spondylosis with radiculopathy, lumbar region: Secondary | ICD-10-CM | POA: Diagnosis not present

## 2023-06-12 DIAGNOSIS — M25561 Pain in right knee: Secondary | ICD-10-CM

## 2023-06-12 DIAGNOSIS — Z79899 Other long term (current) drug therapy: Secondary | ICD-10-CM | POA: Diagnosis present

## 2023-06-12 MED ORDER — HYDROCODONE-ACETAMINOPHEN 10-325 MG PO TABS: 1.0000 | ORAL_TABLET | Freq: Four times a day (QID) | ORAL | 0 refills | Status: DC | PRN

## 2023-06-12 NOTE — Patient Instructions (Signed)
  ______________________________________________________________________    Procedure instructions  Stop blood-thinners  Do not eat or drink fluids (other than water) for 6 hours before your procedure  No water for 2 hours before your procedure  Take your blood pressure medicine with a sip of water  Arrive 30 minutes before your appointment  If sedation is planned, bring suitable driver. Pennie Banter, Benedetto Goad, & public transportation are NOT APPROVED)  Carefully read the "Preparing for your procedure" detailed instructions  If you have questions call us at [phone]  Procedure appointments are for procedures only. NO medication refills or new problem evaluations.   ______________________________________________________________________

## 2023-06-12 NOTE — Progress Notes (Signed)
Safety precautions to be maintained throughout the outpatient stay will include: orient to surroundings, keep bed in low position, maintain call bell within reach at all times, provide assistance with transfer out of bed and ambulation.    Nursing Pain Medication Assessment:  Safety precautions to be maintained throughout the outpatient stay will include: orient to surroundings, keep bed in low position, maintain call bell within reach at all times, provide assistance with transfer out of bed and ambulation.  Medication Inspection Compliance: Pill count conducted under aseptic conditions, in front of the patient. Neither the pills nor the bottle was removed from the patient's sight at any time. Once count was completed pills were immediately returned to the patient in their original bottle.  Medication: Hydrocodone/APAP Pill/Patch Count:  42 of 90 pills remain Pill/Patch Appearance: Markings consistent with prescribed medication Bottle Appearance: Standard pharmacy container. Clearly labeled. Filled Date: 32 / 25 / 2024 Last Medication intake:  Today

## 2023-06-12 NOTE — Progress Notes (Signed)
PROVIDER NOTE: Information contained herein reflects review and annotations entered in association with encounter. Interpretation of such information and data should be left to medically-trained personnel. Information provided to patient can be located elsewhere in the medical record under "Patient Instructions". Document created using STT-dictation technology, any transcriptional errors that may result from process are unintentional.    Patient: Perry Jones  Service Category: E/M  Provider: Edward Jolly, MD  DOB: 1950/12/26  DOS: 06/12/2023  Referring Provider: Dorothey Baseman, MD  MRN: [id number]  Specialty: Interventional Pain Management  PCP: Dorothey Baseman, MD  Type: Established Patient  Setting: Ambulatory outpatient    Location: Office  Delivery: Face-to-face     HPI  Perry Jones, a 72 y.o. year old male, is here today because of his Lumbar facet arthropathy [M47.816]. Perry Jones primary complain today is Knee Pain (right)  Pertinent problems: Perry Jones has Back muscle spasm; Chronic knee pain after total replacement of right knee joint; DDD (degenerative disc disease), lumbar; Essential hypertension; Chronic radicular lumbar pain; History of total right knee replacement; History of lumbar fusion (L1-L3); Chronic bilateral low back pain with bilateral sciatica; Chronic pain syndrome; Lumbar facet arthropathy; and Piriformis syndrome, right on their pertinent problem list. Pain Assessment: Severity of Chronic pain is reported as a 8 /10. Location: Knee Right/denies. Onset: More than a month ago. Quality:  (feels like its about to explode). Timing: Constant. Modifying factor(s): meds. Vitals:  height is 6' (1.829 m) and weight is 230 lb (104.3 kg). His temporal temperature is 97.5 F (36.4 C) (abnormal). His blood pressure is 119/79 and his pulse is 77. His oxygen saturation is 99%.  BMI: Estimated body mass index is 31.19 kg/m as calculated from the following:   Height as of  this encounter: 6' (1.829 m).   Weight as of this encounter: 230 lb (104.3 kg). Last encounter: 03/15/2023. Last procedure: 12/20/2022.  Reason for encounter: medication management and request to repeat right genicular RFA  Discussed the use of AI scribe software for clinical note transcription with the patient, who gave verbal consent to proceed.  History of Present Illness   The patient, with a history of chronic bronchitis and sinusitis, recently completed a one-week course of antibiotics for a severe sinusitis episode. He reports no associated shortness of breath. However, he did not tolerate the prescribed nasal spray. Currently, he is experiencing bronchitis symptoms, including phlegm accumulation when lying flat, which he largely ignores due to significant leg discomfort. This leg discomfort is particularly bothersome when lying flat for the first one to two hours, causing sleep disturbances.  The patient also has a history of knee issues, managed with a stimulator and genicular nerve ablation therapy, previously done December 20 2022 that provided 55% pain relief for approx 6 months . He expresses interest in repeating the ablation, with the stimulator turned off, and increasing the intensity of the procedure. He has the ability to turn off the stimulator remotely.  The patient is also on Voltaren gel and Lyrica for pain management. He reports no recent falls.       Pharmacotherapy Assessment  Analgesic: hydrocodone 10 mg every 8 hours as needed prn    Monitoring: [place] PMP: PDMP reviewed during this encounter.       Pharmacotherapy: No side-effects or adverse reactions reported. Compliance: No problems identified. Effectiveness: Clinically acceptable.  Florina Ou, RN  06/12/2023  9:07 AM  Sign when Signing Visit Safety precautions to be maintained throughout the outpatient stay  will include: orient to surroundings, keep bed in low position, maintain call bell within reach at all  times, provide assistance with transfer out of bed and ambulation.    Nursing Pain Medication Assessment:  Safety precautions to be maintained throughout the outpatient stay will include: orient to surroundings, keep bed in low position, maintain call bell within reach at all times, provide assistance with transfer out of bed and ambulation.  Medication Inspection Compliance: Pill count conducted under aseptic conditions, in front of the patient. Neither the pills nor the bottle was removed from the patient's sight at any time. Once count was completed pills were immediately returned to the patient in their original bottle.  Medication: Hydrocodone/APAP Pill/Patch Count:  42 of 90 pills remain Pill/Patch Appearance: Markings consistent with prescribed medication Bottle Appearance: Standard pharmacy container. Clearly labeled. Filled Date: 95 / 25 / 2024 Last Medication intake:  Today  No results found for: "CBDTHCR" No results found for: "D8THCCBX" No results found for: "D9THCCBX"  UDS:  Summary  Date Value Ref Range Status  10/10/2022 Note  Final    Comment:    ==================================================================== [employer] [facility] 13 (MW) ==================================================================== Test                             Result       Flag       Units  Drug Present and Declared for Prescription Verification   Hydrocodone                    1673         EXPECTED   ng/mg creat   Hydromorphone                  663          EXPECTED   ng/mg creat   Dihydrocodeine                 137          EXPECTED   ng/mg creat   Norhydrocodone                 2245         EXPECTED   ng/mg creat    Sources of hydrocodone include scheduled prescription medications.    Hydromorphone, dihydrocodeine and norhydrocodone are expected    metabolites of hydrocodone. Hydromorphone and dihydrocodeine are    also available as scheduled prescription medications.  Drug Absent  but Declared for Prescription Verification   Lorazepam                      Not Detected UNEXPECTED ng/mg creat ==================================================================== Test                      Result    Flag   Units      Ref Range   Creatinine              101              mg/dL      >=69 ==================================================================== Declared Medications:  The flagging and interpretation on this report are based on the  following declared medications.  Unexpected results may arise from  inaccuracies in the declared medications.   **Note: The testing scope of this panel includes these medications:   Hydrocodone (Norco)  Lorazepam (Ativan)   **Note: The testing scope of this panel does not  include the  following reported medications:   Acetaminophen (Norco)  Benazepril (Lotensin)  Biotin  Calcium  Cyclobenzaprine (Flexeril)  Diclofenac (Voltaren)  Docusate (Colace)  Ezetimibe (Zetia)  Loratadine  Multivitamin  Naproxen (Naprosyn)  Pregabalin (Lyrica)  Supplement  Vitamin C  Zolpidem (Ambien) ==================================================================== For clinical consultation, please call [phone]. ====================================================================       ROS  Constitutional: Denies any fever or chills Gastrointestinal: No reported hemesis, hematochezia, vomiting, or acute GI distress Musculoskeletal:  right knee pain Neurological: No reported episodes of acute onset apraxia, aphasia, dysarthria, agnosia, amnesia, paralysis, loss of coordination, or loss of consciousness  Medication Review  Biotin, Calcium Polycarbophil, Echinacea, HYDROcodone-acetaminophen, LORazepam, Loratadine, Multi-Vitamins, Vitamin C, benazepril-hydrochlorthiazide, cyclobenzaprine, diclofenac Sodium, docusate sodium, ezetimibe, ketoconazole, naproxen, pregabalin, tamsulosin, and zolpidem  History Review  Allergy: Perry Jones is  allergic to gluten meal, pine tar, pistachio nut (diagnostic), pistachio nut extract, and prednisone. Drug: Perry Jones  reports no history of drug use. Alcohol:  reports current alcohol use. Tobacco:  reports that he quit smoking about 38 years ago. His smoking use included cigarettes. He started smoking about 56 years ago. He has a 36 pack-year smoking history. He has never used smokeless tobacco. Social: Perry Jones  reports that he quit smoking about 38 years ago. His smoking use included cigarettes. He started smoking about 56 years ago. He has a 36 pack-year smoking history. He has never used smokeless tobacco. He reports current alcohol use. He reports that he does not use drugs. Medical:  has a past medical history of Actinic keratosis, Dysplastic nevus (03/11/2020), Dysplastic nevus (03/12/2019), High cholesterol, Hypertension, and Sinus trouble. Surgical: Perry Jones  has a past surgical history that includes Knee arthroscopy (Right); Colonoscopy with propofol (N/A, 11/22/2017); Cholecystectomy; Lumbar fusion (N/A, 2000); Skin cancer excision; Replacement total knee (Right, 2007); Spinal cord stimulator insertion (N/A, 02/21/2021); Spinal cord stimulator implant (02/21/2021); Colonoscopy with propofol (N/A, 04/23/2023); and polypectomy (04/23/2023). Family: family history includes Dementia in his mother; Heart Problems in his mother; Lung cancer in his father.  Laboratory Chemistry Profile   Renal Lab Results  Component Value Date   BUN 12 02/10/2021   CREATININE 0.72 02/10/2021   GFRAA >60 07/14/2018   GFRNONAA >60 02/10/2021    Hepatic Lab Results  Component Value Date   AST 45 (H) 09/28/2020   ALT 37 09/28/2020   ALBUMIN 4.5 09/28/2020   ALKPHOS 44 09/28/2020   LIPASE 28 07/14/2018    Electrolytes Lab Results  Component Value Date   NA 139 02/10/2021   K 3.5 02/10/2021   CL 105 02/10/2021   CALCIUM 9.6 02/10/2021    Bone Lab Results  Component Value Date   TESTOSTERONE 45  (L) 09/28/2020    Inflammation (CRP: Acute Phase) (ESR: Chronic Phase) No results found for: "CRP", "ESRSEDRATE", "LATICACIDVEN"       Note: Above Lab results reviewed.  Recent Imaging Review  DG PAIN [facility] C-ARM 1-60 MIN NO REPORT Fluoro was used, but no Radiologist interpretation will be provided.  Please refer to "NOTES" tab for provider progress note. Note: Reviewed        Physical Exam  General appearance: Well nourished, well developed, and well hydrated. In no apparent acute distress Mental status: Alert, oriented x 3 (person, place, & time)       Respiratory: No evidence of acute respiratory distress Eyes: PERLA Vitals: BP 119/79   Pulse 77   Temp (!) 97.5 F (36.4 C) (Temporal)   Ht 6' (1.829 m)  Wt 230 lb (104.3 kg)   SpO2 99%   BMI 31.19 kg/m  BMI: Estimated body mass index is 31.19 kg/m as calculated from the following:   Height as of this encounter: 6' (1.829 m).   Weight as of this encounter: 230 lb (104.3 kg). Ideal: Ideal body weight: 77.6 kg (171 lb 1.2 oz) Adjusted ideal body weight: 88.3 kg (194 lb 10.3 oz)  Right knee pain, worse with weight bearing  Assessment   Diagnosis Status  1. Lumbar facet arthropathy   2. Failed back surgical syndrome   3. Pharmacologic therapy   4. Chronic radicular lumbar pain   5. Chronic pain syndrome   6. Chronic use of opiate for therapeutic purpose   7. Chronic knee pain after total replacement of right knee joint   8. History of total right knee replacement    Controlled Controlled Controlled   Updated Problems: No problems updated.  Plan of Care  Problem-specific:  Assessment and Plan    Chronic Knee Pain   Previously treated with ablation on June 24th, his chronic right knee pain persists due to the stimulator being on during the last procedure, potentially affecting pain relief. We will perform another ablation with the stimulator off and increase the burn duration to two minutes for longer relief.  We discussed the temperature threshold for ablation and the risks of exceeding 80C, which could irritate surrounding tissues. Increasing burn duration rather than temperature may provide better outcomes without additional risks. A knee-specific stimulator was considered but not pursued.  Bronchitis   He is experiencing a current episode of bronchitis with phlegm production, especially when lying flat, but no shortness of breath. He recently completed a seven-day course of antibiotics for sinusitis. We will monitor symptoms and consider further treatment if symptoms persist.   MM as below, no change in dose  Follow-up   The next refill is scheduled for June 19, 2023. We will schedule a follow-up visit after the knee ablation.       Perry Jones has a current medication list which includes the following long-term medication(s): benazepril-hydrochlorthiazide, loratadine, [START ON 06/19/2023] hydrocodone-acetaminophen, [START ON 07/19/2023] hydrocodone-acetaminophen, and [START ON 08/18/2023] hydrocodone-acetaminophen.  Pharmacotherapy (Medications Ordered): Meds ordered this encounter  Medications   HYDROcodone-acetaminophen (NORCO) 10-325 MG tablet    Sig: Take 1 tablet by mouth every 6 (six) hours as needed for severe pain (pain score 7-10). Must last 30 days.    Dispense:  120 tablet    Refill:  0    Chronic Pain: STOP Act (Not applicable) Fill 1 day early if closed on refill date. Avoid benzodiazepines within 8 hours of opioids   HYDROcodone-acetaminophen (NORCO) 10-325 MG tablet    Sig: Take 1 tablet by mouth every 6 (six) hours as needed for severe pain (pain score 7-10). Must last 30 days.    Dispense:  120 tablet    Refill:  0    Chronic Pain: STOP Act (Not applicable) Fill 1 day early if closed on refill date. Avoid benzodiazepines within 8 hours of opioids   HYDROcodone-acetaminophen (NORCO) 10-325 MG tablet    Sig: Take 1 tablet by mouth every 6 (six) hours as needed  for severe pain (pain score 7-10). Must last 30 days.    Dispense:  120 tablet    Refill:  0    Chronic Pain: STOP Act (Not applicable) Fill 1 day early if closed on refill date. Avoid benzodiazepines within 8 hours of opioids   Orders:  Orders Placed This Encounter  Procedures   Radiofrequency,Genicular    Standing Status:   Future    Expiration Date:   09/10/2023    Scheduling Instructions:     Side(s): RIGHT     Level(s): Superior-Lateral, Superior-Medial, and Inferior-Medial Genicular Nerve(s)     Sedation: without     Scheduling Timeframe: As soon as pre-approved    Where will this procedure be performed?:   [facility]   Follow-up plan:   No follow-ups on file.      Recent Visits Date Type Provider Dept  03/15/23 Office Visit Edward Jolly, MD [facility]  Showing recent visits within past 90 days and meeting all other requirements Today's Visits Date Type Provider Dept  06/12/23 Office Visit Edward Jolly, MD [facility]  Showing today's visits and meeting all other requirements Future Appointments No visits were found meeting these conditions. Showing future appointments within next 90 days and meeting all other requirements  I discussed the assessment and treatment plan with the patient. The patient was provided an opportunity to ask questions and all were answered. The patient agreed with the plan and demonstrated an understanding of the instructions.  Patient advised to call back or seek an in-person evaluation if the symptoms or condition worsens.  Duration of encounter: .  Total time on encounter, as per AMA guidelines included both the face-to-face and non-face-to-face time personally spent by the physician and/or other qualified health care professional(s) on the day of the encounter (includes time in activities that require the physician or other qualified health care professional and does not include time in activities  normally performed by clinical staff). Physician's time may include the following activities when performed: Preparing to see the patient (e.g., pre-charting review of records, searching for previously ordered imaging, lab work, and nerve conduction tests) Review of prior analgesic pharmacotherapies. Reviewing PMP Interpreting ordered tests (e.g., lab work, imaging, nerve conduction tests) Performing post-procedure evaluations, including interpretation of diagnostic procedures Obtaining and/or reviewing separately obtained history Performing a medically appropriate examination and/or evaluation Counseling and educating the patient/family/caregiver Ordering medications, tests, or procedures Referring and communicating with other health care professionals (when not separately reported) Documenting clinical information in the electronic or other health record Independently interpreting results (not separately reported) and communicating results to the patient/ family/caregiver Care coordination (not separately reported)  Note by: Edward Jolly, MD Date: 06/12/2023; Time: 9:38 AM

## 2023-07-16 ENCOUNTER — Encounter: Payer: Self-pay | Admitting: Student in an Organized Health Care Education/Training Program

## 2023-07-16 ENCOUNTER — Ambulatory Visit
Admission: RE | Admit: 2023-07-16 | Discharge: 2023-07-16 | Disposition: A | Discharge status: Home / Self Care | Payer: Medicare HMO | Source: Ambulatory Visit | Attending: Student in an Organized Health Care Education/Training Program | Admitting: Student in an Organized Health Care Education/Training Program

## 2023-07-16 ENCOUNTER — Ambulatory Visit
Payer: Medicare HMO | Attending: Student in an Organized Health Care Education/Training Program | Admitting: Student in an Organized Health Care Education/Training Program

## 2023-07-16 DIAGNOSIS — G8929 Other chronic pain: Secondary | ICD-10-CM | POA: Insufficient documentation

## 2023-07-16 DIAGNOSIS — M25561 Pain in right knee: Secondary | ICD-10-CM | POA: Insufficient documentation

## 2023-07-16 DIAGNOSIS — Z96651 Presence of right artificial knee joint: Secondary | ICD-10-CM | POA: Diagnosis present

## 2023-07-16 MED ORDER — LIDOCAINE HCL 2 % IJ SOLN
INTRAMUSCULAR | Status: AC
  Filled 2023-07-16: qty 20

## 2023-07-16 MED ORDER — DEXAMETHASONE SODIUM PHOSPHATE 10 MG/ML IJ SOLN
INTRAMUSCULAR | Status: AC
  Filled 2023-07-16: qty 1

## 2023-07-16 MED ORDER — ROPIVACAINE HCL 2 MG/ML IJ SOLN
INTRAMUSCULAR | Status: AC
  Filled 2023-07-16: qty 20

## 2023-07-16 MED ORDER — LIDOCAINE HCL 2 % IJ SOLN
20.0000 mL | Freq: Once | INTRAMUSCULAR | Status: AC
  Administered 2023-07-16: 400 mg

## 2023-07-16 MED ORDER — DEXAMETHASONE SODIUM PHOSPHATE 10 MG/ML IJ SOLN
10.0000 mg | Freq: Once | INTRAMUSCULAR | Status: AC
  Administered 2023-07-16: 10 mg

## 2023-07-16 MED ORDER — ROPIVACAINE HCL 2 MG/ML IJ SOLN
9.0000 mL | Freq: Once | INTRAMUSCULAR | Status: AC
  Administered 2023-07-16: 9 mL via PERINEURAL

## 2023-07-16 NOTE — Progress Notes (Signed)
PROVIDER NOTE: Interpretation of information contained herein should be left to medically-trained personnel. Specific patient instructions are provided elsewhere under "Patient Instructions" section of medical record. This document was created in part using STT-dictation technology, any transcriptional errors that may result from this process are unintentional.  Patient: Perry Jones Type: Established DOB: [birth year] MRN: [id number] PCP: Dorothey Baseman, MD  Service: Procedure DOS: 07/16/2023 Setting: Ambulatory Location: Ambulatory outpatient facility Delivery: Face-to-face Provider: Edward Jolly, MD Specialty: Interventional Pain Management Specialty designation: 09 Location: Outpatient facility Ref. Prov.: Edward Jolly, MD       Interventional Therapy   Primary Reason for Visit: Interventional Pain Management Treatment. CC: Knee Pain (Right )    Procedure:          Anesthesia, Analgesia, Anxiolysis:  Type: Therapeutic Superolateral, Superomedial, and Inferomedial, Genicular Nerve Radiofrequency Ablation (destruction).   #2  Region: Lateral, Anterior, and Medial aspects of the knee joint, above and below the knee joint proper. Level: Superior and inferior to the knee joint. Laterality: Right  Anesthesia: Local (1-2% Lidocaine)  Anxiolysis: None  Sedation: None  Guidance: Fluoroscopy           Position: Supine   Indications: 1. 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 History of total right knee replacement    Perry Jones has been dealing with the above chronic pain for longer than three months and has either failed to respond, was unable to tolerate, or simply did not get enough benefit from other more conservative therapies including, but not limited to: 1. Over-the-counter medications 2. Anti-inflammatory medications 3. Muscle relaxants 4. Membrane stabilizers 5. Opioids 6. Physical therapy and/or chiropractic manipulation 7. Modalities  (Heat, ice, etc.) 8. Invasive techniques such as nerve blocks. [name] has attained more than 50% relief of the pain from a series of diagnostic injections conducted in separate occasions.  Pain Score: Pre-procedure: 5 /10 Post-procedure: 2 /10    Pre-op H&P Assessment:  Perry Jones is a 73 y.o. (year old), male patient, seen today for interventional treatment. He  has a past surgical history that includes Knee arthroscopy (Right); Colonoscopy with propofol (N/A, 11/22/2017); Cholecystectomy; Lumbar fusion (N/A, 2000); Skin cancer excision; Replacement total knee (Right, 2007); Spinal cord stimulator insertion (N/A, 02/21/2021); Spinal cord stimulator implant (02/21/2021); Colonoscopy with propofol (N/A, 04/23/2023); and polypectomy (04/23/2023). Perry Jones has a current medication list which includes the following prescription(s): vitamin c, benazepril-hydrochlorthiazide, biotin, calcium polycarbophil, cyclobenzaprine, diclofenac sodium, docusate sodium, echinacea, ezetimibe, hydrocodone-acetaminophen, [START ON 07/19/2023] hydrocodone-acetaminophen, [START ON 08/18/2023] hydrocodone-acetaminophen, ketoconazole, loratadine, lorazepam, multi-vitamins, naproxen, omeprazole, pregabalin, tamsulosin, and zolpidem. His primarily concern today is the Knee Pain (Right )  Initial Vital Signs:  Pulse/HCG Rate: 84  Temp: 99.3 F (37.4 C) Resp: 16 BP: (!) 146/79 SpO2: 98 %  BMI: Estimated body mass index is 30.65 kg/m as calculated from the following:   Height as of this encounter: 6' (1.829 m).   Weight as of this encounter: 226 lb (102.5 kg).  Risk Assessment: Allergies: Reviewed. He is allergic to gluten meal, pine tar, pistachio nut (diagnostic), pistachio nut extract, and prednisone.  Allergy Precautions: None required Coagulopathies: Reviewed. None identified.  Blood-thinner therapy: None at this time Active Infection(s): Reviewed. None identified. Perry Jones is afebrile  Site Confirmation:  Perry Jones was asked to confirm the procedure and laterality before marking the site Procedure checklist: Completed Consent: Before the procedure and under the influence of no sedative(s), amnesic(s), or anxiolytics, the patient was informed of  the treatment options, risks and possible complications. To fulfill our ethical and legal obligations, as recommended by the American Medical Association's Code of Ethics, I have informed the patient of my clinical impression; the nature and purpose of the treatment or procedure; the risks, benefits, and possible complications of the intervention; the alternatives, including doing nothing; the risk(s) and benefit(s) of the alternative treatment(s) or procedure(s); and the risk(s) and benefit(s) of doing nothing. The patient was provided information about the general risks and possible complications associated with the procedure. These may include, but are not limited to: failure to achieve desired goals, infection, bleeding, organ or nerve damage, allergic reactions, paralysis, and death. In addition, the patient was informed of those risks and complications associated to the procedure, such as failure to decrease pain; infection; bleeding; organ or nerve damage with subsequent damage to sensory, motor, and/or autonomic systems, resulting in permanent pain, numbness, and/or weakness of one or several areas of the body; allergic reactions; (i.e.: anaphylactic reaction); and/or death. Furthermore, the patient was informed of those risks and complications associated with the medications. These include, but are not limited to: allergic reactions (i.e.: anaphylactic or anaphylactoid reaction(s)); adrenal axis suppression; blood sugar elevation that in diabetics may result in ketoacidosis or comma; water retention that in patients with history of congestive heart failure may result in shortness of breath, pulmonary edema, and decompensation with resultant heart failure; weight  gain; swelling or edema; medication-induced neural toxicity; particulate matter embolism and blood vessel occlusion with resultant organ, and/or nervous system infarction; and/or aseptic necrosis of one or more joints. Finally, the patient was informed that Medicine is not an exact science; therefore, there is also the possibility of unforeseen or unpredictable risks and/or possible complications that may result in a catastrophic outcome. The patient indicated having understood very clearly. We have given the patient no guarantees and we have made no promises. Enough time was given to the patient to ask questions, all of which were answered to the patient's satisfaction. Mr. Galin has indicated that he wanted to continue with the procedure. Attestation: I, the ordering provider, attest that I have discussed with the patient the benefits, risks, side-effects, alternatives, likelihood of achieving goals, and potential problems during recovery for the procedure that I have provided informed consent. Date  Time: 07/16/2023  9:53 AM  Pre-Procedure Preparation:  Monitoring: As per clinic protocol. Respiration, ETCO2, SpO2, BP, heart rate and rhythm monitor placed and checked for adequate function Safety Precautions: Patient was assessed for positional comfort and pressure points before starting the procedure. Time-out: I initiated and conducted the "Time-out" before starting the procedure, as per protocol. The patient was asked to participate by confirming the accuracy of the "Time Out" information. Verification of the correct person, site, and procedure were performed and confirmed by me, the nursing staff, and the patient. "Time-out" conducted as per Joint Commission's Universal Protocol (UP.01.01.01). Time: 1051 Start Time: 1051 hrs.  Description of Procedure:          Target Area: For Genicular Nerve radiofrequency ablation (destruction), the targets are: the superolateral genicular nerve, located in the  lateral distal portion of the femoral shaft as it curves to form the lateral epicondyle, in the region of the distal femoral metaphysis; the superomedial genicular nerve, located in the medial distal portion of the femoral shaft as it curves to form the medial epicondyle; and the inferomedial genicular nerve, located in the medial, proximal portion of the tibial shaft, as it curves to form the medial  epicondyle, in the region of the proximal tibial metaphysis. Approach: Anterior, ipsilateral approach. Area Prepped: Entire knee area, from mid-thigh to mid-shin, lateral, anterior, and medial aspects. DuraPrep (Iodine Povacrylex [0.7% available iodine] and Isopropyl Alcohol, 74% w/w) Safety Precautions: Aspiration looking for blood return was conducted prior to all injections. At no point did we inject any substances, as a needle was being advanced. No attempts were made at seeking any paresthesias. Safe injection practices and needle disposal techniques used. Medications properly checked for expiration dates. SDV (single dose vial) medications used. Description of the Procedure: Protocol guidelines were followed. The patient was placed in position over the procedure table. The target area was identified and the area prepped in the usual manner. The skin and muscle were infiltrated with local anesthetic. Appropriate amount of time allowed to pass for local anesthetics to take effect. Radiofrequency needles were introduced to the target area using fluoroscopic guidance. Using the NeuroTherm NT1100 Radiofrequency Generator, sensory stimulation using 50 Hz was used to locate & identify the nerve, making sure that the needle was positioned such that there was no sensory stimulation below 0.3 V or above 0.7 V. Stimulation using 2 Hz was used to evaluate the motor component. Care was taken not to lesion any nerves that demonstrated motor stimulation of the lower extremities at an output of less than 2.5 times that of  the sensory threshold, or a maximum of 2.0 V. Once satisfactory placement of the needles was achieved, the numbing solution was slowly injected after negative aspiration. After waiting for at least 2 minutes, the ablation was performed at 80 degrees C for 60 seconds, using regular Radiofrequency settings. Once the procedure was completed, the needles were then removed and the area cleansed, making sure to leave some of the prepping solution back to take advantage of its long term bactericidal properties. Intra-operative Compliance: Compliant      Vitals:   07/16/23 1055 07/16/23 1100 07/16/23 1105 07/16/23 1110  BP: (!) 143/83 (!) 126/104 128/77 (!) 146/81  Pulse: 78 77 76 72  Resp: 13 15 16    Temp:      TempSrc:      SpO2: 97% 99% 98% 99%  Weight:      Height:        Start Time: 1051 hrs. End Time: 1108 hrs. Materials & Medications:  Needle(s) Type: Teflon-coated, curved tip, Radiofrequency needle(s) Gauge: 22G Length: 10cm Medication(s): Please see orders for medications and dosing details. 6 cc solution made of 5 cc of 0.2% ropivacaine, 1 cc of Decadron 10 mg/cc.  2 cc injected at each level above for the right genicular nerves after sensorimotor testing, prior to lesioning. Imaging Guidance (Non-Spinal):          Type of Imaging Technique: Fluoroscopy Guidance (Non-Spinal) Indication(s): Assistance in needle guidance and placement for procedures requiring needle placement in or near specific anatomical locations not easily accessible without such assistance. Exposure Time: Please see nurses notes. Contrast: None used. Fluoroscopic Guidance: I was personally present during the use of fluoroscopy. "Tunnel Vision Technique" used to obtain the best possible view of the target area. Parallax error corrected before commencing the procedure. "Direction-depth-direction" technique used to introduce the needle under continuous pulsed fluoroscopy. Once target was reached, antero-posterior,  oblique, and lateral fluoroscopic projection used confirm needle placement in all planes. Images permanently stored in EMR. Interpretation: No contrast injected.  Antibiotic Prophylaxis:   Anti-infectives (From admission, onward)    None      Indication(s): None identified  Post-operative Assessment:  Post-procedure Vital Signs:  Pulse/HCG Rate: 72  Temp: 99.3 F (37.4 C) Resp: 16 BP: (!) 146/81 SpO2: 99 %  EBL: None  Complications: No immediate post-treatment complications observed by team, or reported by patient.  Note: The patient tolerated the entire procedure well. A repeat set of vitals were taken after the procedure and the patient was kept under observation following institutional policy, for this type of procedure. Post-procedural neurological assessment was performed, showing return to baseline, prior to discharge. The patient was provided with post-procedure discharge instructions, including a section on how to identify potential problems. Should any problems arise concerning this procedure, the patient was given instructions to immediately contact us, at any time, without hesitation. In any case, we plan to contact the patient by telephone for a follow-up status report regarding this interventional procedure.  Comments:  No additional relevant information.  Plan of Care (POC)  Orders:  Orders Placed This Encounter  Procedures   DG PAIN CLINIC C-ARM 1-60 MIN NO REPORT    Intraoperative interpretation by procedural physician at [facility].    Standing Status:   Standing    Number of Occurrences:   1    Reason for exam::   Assistance in needle guidance and placement for procedures requiring needle placement in or near specific anatomical locations not easily accessible without such assistance.   Chronic Opioid Analgesic:  hydrocodone 10 mg, 2-3  times daily as needed    Medications ordered for procedure: Meds ordered this encounter  Medications    lidocaine (XYLOCAINE) 2 % (with pres) injection 400 mg   dexamethasone (DECADRON) injection 10 mg   ropivacaine (PF) 2 mg/mL (0.2%) (NAROPIN) injection 9 mL   Medications administered: We administered lidocaine, dexamethasone, and ropivacaine (PF) 2 mg/mL (0.2%).  See the medical record for exact dosing, route, and time of administration.  Follow-up plan:   No follow-ups on file.      Recent Visits Date Type Provider Dept  06/12/23 Office Visit Edward Jolly, MD [facility]  Showing recent visits within past 90 days and meeting all other requirements Today's Visits Date Type Provider Dept  07/16/23 Procedure visit Edward Jolly, MD [facility]  Showing today's visits and meeting all other requirements Future Appointments Date Type Provider Dept  09/06/23 Appointment Edward Jolly, MD [facility]  Showing future appointments within next 90 days and meeting all other requirements  Disposition: Discharge home  Discharge (Date  Time): 07/16/2023; 1116 hrs.   Primary Care Physician: Dorothey Baseman, MD Location: [facility] Note by: Edward Jolly, MD (TTS technology used. I apologize for any typographical errors that were not detected and corrected.) Date: 07/16/2023; Time: 12:37 PM  Disclaimer:  Medicine is not an Visual merchandiser. The only guarantee in medicine is that nothing is guaranteed. It is important to note that the decision to proceed with this intervention was based on the information collected from the patient. The Data and conclusions were drawn from the patient's questionnaire, the interview, and the physical examination. Because the information was provided in large part by the patient, it cannot be guaranteed that it has not been purposely or unconsciously manipulated. Every effort has been made to obtain as much relevant data as possible for this evaluation. It is important to note that the conclusions that lead to  this procedure are derived in large part from the available data. Always take into account that the treatment will also be dependent on availability  of resources and existing treatment guidelines, considered by other Pain Management Practitioners as being common knowledge and practice, at the time of the intervention. For Medico-Legal purposes, it is also important to point out that variation in procedural techniques and pharmacological choices are the acceptable norm. The indications, contraindications, technique, and results of the above procedure should only be interpreted and judged by a Board-Certified Interventional Pain Specialist with extensive familiarity and expertise in the same exact procedure and technique.

## 2023-07-16 NOTE — Patient Instructions (Signed)

## 2023-07-16 NOTE — Progress Notes (Signed)
Safety precautions to be maintained throughout the outpatient stay will include: orient to surroundings, keep bed in low position, maintain call bell within reach at all times, provide assistance with transfer out of bed and ambulation.  

## 2023-07-17 ENCOUNTER — Telehealth: Payer: Self-pay | Admitting: *Deleted

## 2023-07-17 NOTE — Telephone Encounter (Signed)
No problems post procedure. 

## 2023-08-16 ENCOUNTER — Ambulatory Visit
Payer: Medicare HMO | Attending: Student in an Organized Health Care Education/Training Program | Admitting: Student in an Organized Health Care Education/Training Program

## 2023-08-16 ENCOUNTER — Encounter: Payer: Self-pay | Admitting: Student in an Organized Health Care Education/Training Program

## 2023-08-16 VITALS — BP 153/68 | HR 62 | Temp 97.2°F | Resp 16 | Ht 72.0 in | Wt 230.0 lb

## 2023-08-16 DIAGNOSIS — M25561 Pain in right knee: Secondary | ICD-10-CM | POA: Insufficient documentation

## 2023-08-16 DIAGNOSIS — Z96651 Presence of right artificial knee joint: Secondary | ICD-10-CM | POA: Diagnosis present

## 2023-08-16 DIAGNOSIS — M792 Neuralgia and neuritis, unspecified: Secondary | ICD-10-CM | POA: Insufficient documentation

## 2023-08-16 DIAGNOSIS — G8929 Other chronic pain: Secondary | ICD-10-CM | POA: Insufficient documentation

## 2023-08-16 MED ORDER — NONFORMULARY OR COMPOUNDED ITEM: 1.0000 mL | Freq: Four times a day (QID) | 2 refills | Status: AC | PRN

## 2023-08-16 NOTE — Progress Notes (Signed)
PROVIDER NOTE: Information contained herein reflects review and annotations entered in association with encounter. Interpretation of such information and data should be left to medically-trained personnel. Information provided to patient can be located elsewhere in the medical record under "Patient Instructions". Document created using STT-dictation technology, any transcriptional errors that may result from process are unintentional.    Patient: Perry Jones  Service Category: E/M  Provider: Edward Jolly, MD  DOB: 24-Jul-1950  DOS: 08/16/2023  Referring Provider: Dorothey Baseman, MD  MRN: [id number]  Specialty: Interventional Pain Management  PCP: Dorothey Baseman, MD  Type: Established Patient  Setting: Ambulatory outpatient    Location: Office  Delivery: Face-to-face     HPI  [name], a 73 y.o. year old male, is here today because of his History of total right knee replacement [Z96.651]. Mr. Mullett primary complain today is Foot Pain (right) and Back Pain (lower)  Pertinent problems: Mr. Sleight has Back muscle spasm; Chronic knee pain after total replacement of right knee joint; DDD (degenerative disc disease), lumbar; Essential hypertension; Chronic radicular lumbar pain; History of total right knee replacement; History of lumbar fusion (L1-L3); Chronic bilateral low back pain with bilateral sciatica; Chronic pain syndrome; Lumbar facet arthropathy; and Piriformis syndrome, right on their pertinent problem list. Pain Assessment: Severity of Chronic pain is reported as a 4 /10. Location: Foot Right/denies, worse at toes and ball. Onset: More than a month ago. Quality: Pressure, Burning. Timing: Constant. Modifying factor(s): scs, meds. Vitals:  height is 6' (1.829 m) and weight is 230 lb (104.3 kg). His temperature is 97.2 F (36.2 C) (abnormal). His blood pressure is 153/68 (abnormal) and his pulse is 62. His respiration is 16 and oxygen saturation is 100%.  BMI: Estimated body mass  index is 31.19 kg/m as calculated from the following:   Height as of this encounter: 6' (1.829 m).   Weight as of this encounter: 230 lb (104.3 kg). Last encounter: 06/12/2023. Last procedure: 07/16/2023.  Reason for encounter: patient-requested evaluation.   Discussed the use of AI scribe software for clinical note transcription with the patient, who gave verbal consent to proceed.  History of Present Illness   [name] Render "Perry Jones" is a 73 year old male with neuropathy who presents with right foot pain for potential ablation.  He experiences significant pain in his entire right foot, with the worst pain localized to the toes and the ball of the foot. The pain is described as swelling and burning, feeling like 'a balloon in a burst.' It is most severe at night, impacting his sleep, and extends from the ankle down.  He has a history of neuropathy and has been on gabapentin previously, which he discontinued due to cognitive side effects. He is currently taking Lyrica, which provides some relief but does not last through the night. He also uses Voltaren gel topically, which helps when applied liberally.  Pain has been present for years on the right side and is now beginning to develop on the left side, though not as severely. He has not found significant relief from his current spinal cord stimulator settings, which were initially adjusted for knee pain.  He has tried various treatments, including nerve stimulation adjustments, but has not achieved satisfactory relief for his foot pain. He has not used compounded creams but is open to exploring this option.       Pharmacotherapy Assessment  Analgesic: hydrocodone 10 mg, 2-3  times daily as needed    Monitoring: [place] PMP: PDMP  reviewed during this encounter.       Pharmacotherapy: No side-effects or adverse reactions reported. Compliance: No problems identified. Effectiveness: Clinically acceptable.  Nonah Mattes, RN  08/16/2023 10:28  AM  Sign when Signing Visit Nursing Pain Medication Assessment:  Safety precautions to be maintained throughout the outpatient stay will include: orient to surroundings, keep bed in low position, maintain call bell within reach at all times, provide assistance with transfer out of bed and ambulation.  Medication Inspection Compliance: Pill count conducted under aseptic conditions, in front of the patient. Neither the pills nor the bottle was removed from the patient's sight at any time. Once count was completed pills were immediately returned to the patient in their original bottle.  Medication: Hydrocodone/APAP Pill/Patch Count:  16 of 120 pills remain Pill/Patch Appearance: Markings consistent with prescribed medication Bottle Appearance: Standard pharmacy container. Clearly labeled. Filled Date: 1 / 40 / 2025 Last Medication intake:  Today  No results found for: "CBDTHCR" No results found for: "D8THCCBX" No results found for: "D9THCCBX"  UDS:  Summary  Date Value Ref Range Status  10/10/2022 Note  Final    Comment:    ==================================================================== [employer] [facility] 13 (MW) ==================================================================== Test                             Result       Flag       Units  Drug Present and Declared for Prescription Verification   Hydrocodone                    1673         EXPECTED   ng/mg creat   Hydromorphone                  663          EXPECTED   ng/mg creat   Dihydrocodeine                 137          EXPECTED   ng/mg creat   Norhydrocodone                 2245         EXPECTED   ng/mg creat    Sources of hydrocodone include scheduled prescription medications.    Hydromorphone, dihydrocodeine and norhydrocodone are expected    metabolites of hydrocodone. Hydromorphone and dihydrocodeine are    also available as scheduled prescription medications.  Drug Absent but Declared for Prescription Verification    Lorazepam                      Not Detected UNEXPECTED ng/mg creat ==================================================================== Test                      Result    Flag   Units      Ref Range   Creatinine              101              mg/dL      >=25 ==================================================================== Declared Medications:  The flagging and interpretation on this report are based on the  following declared medications.  Unexpected results may arise from  inaccuracies in the declared medications.   **Note: The testing scope of this panel includes these medications:   Hydrocodone (Norco)  Lorazepam (Ativan)   **Note: The  testing scope of this panel does not include the  following reported medications:   Acetaminophen (Norco)  Benazepril (Lotensin)  Biotin  Calcium  Cyclobenzaprine (Flexeril)  Diclofenac (Voltaren)  Docusate (Colace)  Ezetimibe (Zetia)  Loratadine  Multivitamin  Naproxen (Naprosyn)  Pregabalin (Lyrica)  Supplement  Vitamin C  Zolpidem (Ambien) ==================================================================== For clinical consultation, please call [phone]. ====================================================================       ROS  Constitutional: Denies any fever or chills Gastrointestinal: No reported hemesis, hematochezia, vomiting, or acute GI distress Musculoskeletal:  right>left ankle pain Neurological: No reported episodes of acute onset apraxia, aphasia, dysarthria, agnosia, amnesia, paralysis, loss of coordination, or loss of consciousness  Medication Review  Biotin, Calcium Polycarbophil, Echinacea, HYDROcodone-acetaminophen, LORazepam, Loratadine, Multi-Vitamins, NONFORMULARY OR COMPOUNDED ITEM, Vitamin C, benazepril-hydrochlorthiazide, cyclobenzaprine, diclofenac Sodium, docusate sodium, ezetimibe, ketoconazole, naproxen, omeprazole, pregabalin, tamsulosin, and zolpidem  History Review  Allergy: Mr. Adorno  is allergic to gluten meal, pine tar, pistachio nut (diagnostic), pistachio nut extract, and prednisone. Drug: Mr. Kucinski  reports no history of drug use. Alcohol:  reports current alcohol use. Tobacco:  reports that he quit smoking about 38 years ago. His smoking use included cigarettes. He started smoking about 56 years ago. He has a 36 pack-year smoking history. He has never used smokeless tobacco. Social: Mr. Gaster  reports that he quit smoking about 38 years ago. His smoking use included cigarettes. He started smoking about 56 years ago. He has a 36 pack-year smoking history. He has never used smokeless tobacco. He reports current alcohol use. He reports that he does not use drugs. Medical:  has a past medical history of Actinic keratosis, Dysplastic nevus (03/11/2020), Dysplastic nevus (03/12/2019), High cholesterol, Hypertension, and Sinus trouble. Surgical: Mr. Crickenberger  has a past surgical history that includes Knee arthroscopy (Right); Colonoscopy with propofol (N/A, 11/22/2017); Cholecystectomy; Lumbar fusion (N/A, 2000); Skin cancer excision; Replacement total knee (Right, 2007); Spinal cord stimulator insertion (N/A, 02/21/2021); Spinal cord stimulator implant (02/21/2021); Colonoscopy with propofol (N/A, 04/23/2023); and polypectomy (04/23/2023). Family: family history includes Dementia in his mother; Heart Problems in his mother; Lung cancer in his father.  Laboratory Chemistry Profile   Renal Lab Results  Component Value Date   BUN 12 02/10/2021   CREATININE 0.72 02/10/2021   GFRAA >60 07/14/2018   GFRNONAA >60 02/10/2021    Hepatic Lab Results  Component Value Date   AST 45 (H) 09/28/2020   ALT 37 09/28/2020   ALBUMIN 4.5 09/28/2020   ALKPHOS 44 09/28/2020   LIPASE 28 07/14/2018    Electrolytes Lab Results  Component Value Date   NA 139 02/10/2021   K 3.5 02/10/2021   CL 105 02/10/2021   CALCIUM 9.6 02/10/2021    Bone Lab Results  Component Value Date   TESTOSTERONE  45 (L) 09/28/2020    Inflammation (CRP: Acute Phase) (ESR: Chronic Phase) No results found for: "CRP", "ESRSEDRATE", "LATICACIDVEN"       Note: Above Lab results reviewed.  Recent Imaging Review  DG PAIN CLINIC C-ARM 1-60 MIN NO REPORT Fluoro was used, but no Radiologist interpretation will be provided.  Please refer to "NOTES" tab for provider progress note. Note: Reviewed        Physical Exam  General appearance: Well nourished, well developed, and well hydrated. In no apparent acute distress Mental status: Alert, oriented x 3 (person, place, & time)       Respiratory: No evidence of acute respiratory distress Eyes: PERLA Vitals: BP (!) 153/68   Pulse 62   Temp (!)  97.2 F (36.2 C)   Resp 16   Ht 6' (1.829 m)   Wt 230 lb (104.3 kg)   SpO2 100%   BMI 31.19 kg/m  BMI: Estimated body mass index is 31.19 kg/m as calculated from the following:   Height as of this encounter: 6' (1.829 m).   Weight as of this encounter: 230 lb (104.3 kg). Ideal: Ideal body weight: 77.6 kg (171 lb 1.2 oz) Adjusted ideal body weight: 88.3 kg (194 lb 10.3 oz)  Right ankle pain  Assessment   Diagnosis Status  1. History of total right knee replacement   2. Chronic knee pain after total replacement of right knee joint   3. Neuropathic pain of right ankle   4. Neuropathic pain of left ankle    Controlled Controlled Having a Flare-up   Updated Problems: Problem  Neuropathic Pain of Left Ankle    Plan of Care  Problem-specific:  Assessment and Plan    Right Foot Pain   Chronic right foot pain affects the toes and ball of the foot, with swelling and burning sensations worsening at night. The pain is likely neuropathic, given the history and response to gabapentin and Lyrica. The differential includes neuropathy and musculoskeletal issues. Ablating superficial nerves is challenging due to his superficial course. An ankle block is proposed to assess response, with the possibility of a  chemical block or nerve ablation if positive. Discuss with [employer] [facility] representatives to adjust stimulation parameters to target foot pain. Consider a compounded topical cream with ketamine, gabapentin, and Flexeril. Evaluate the response to topical treatment before considering an ankle block. A follow-up appointment is scheduled for September 06, 2023.  Neuropathy   Neuropathy is managed with Lyrica, providing partial relief. Symptoms are more severe on the right side and are developing on the left. Compounded topical creams may benefit neuropathic pain management, considering cost and better absorption due to thin skin around the ankle. Continue Lyrica and consider a compounded topical cream for additional relief. Monitor for progression of symptoms on the left side.  Follow-up   A follow-up appointment is scheduled for September 06, 2023. Coordinate with Baxter Hire and Madelaine Bhat for potential adjustments to stimulation parameters on Tuesday.       Mr. Denton Derks has a current medication list which includes the following long-term medication(s): benazepril-hydrochlorthiazide, hydrocodone-acetaminophen, [START ON 08/18/2023] hydrocodone-acetaminophen, loratadine, omeprazole, and hydrocodone-acetaminophen.  Pharmacotherapy (Medications Ordered): Meds ordered this encounter  Medications   NONFORMULARY OR COMPOUNDED ITEM    Sig: Apply 1-2 mLs topically 4 (four) times daily as needed (for pain).    Dispense:  1 each    Refill:  2    Please compound: Ketamine (10%) + Cyclobenzaprine (2%) + Gabapentin (6%) Cream. Dispense: 240 GM bottle   Orders:  No orders of the defined types were placed in this encounter.  Follow-up plan:   Return for Keep sch. appt.     Recent Visits Date Type Provider Dept  07/16/23 Procedure visit Edward Jolly, MD [facility]  06/12/23 Office Visit Edward Jolly, MD [facility]  Showing recent visits within past 90 days and meeting all other  requirements Today's Visits Date Type Provider Dept  08/16/23 Office Visit Edward Jolly, MD [facility]  Showing today's visits and meeting all other requirements Future Appointments Date Type Provider Dept  09/06/23 Appointment Edward Jolly, MD [facility]  Showing future appointments within next 90 days and meeting all other requirements  I discussed the assessment and  treatment plan with the patient. The patient was provided an opportunity to ask questions and all were answered. The patient agreed with the plan and demonstrated an understanding of the instructions.  Patient advised to call back or seek an in-person evaluation if the symptoms or condition worsens.  Duration of encounter: .  Total time on encounter, as per AMA guidelines included both the face-to-face and non-face-to-face time personally spent by the physician and/or other qualified health care professional(s) on the day of the encounter (includes time in activities that require the physician or other qualified health care professional and does not include time in activities normally performed by clinical staff). Physician's time may include the following activities when performed: Preparing to see the patient (e.g., pre-charting review of records, searching for previously ordered imaging, lab work, and nerve conduction tests) Review of prior analgesic pharmacotherapies. Reviewing PMP Interpreting ordered tests (e.g., lab work, imaging, nerve conduction tests) Performing post-procedure evaluations, including interpretation of diagnostic procedures Obtaining and/or reviewing separately obtained history Performing a medically appropriate examination and/or evaluation Counseling and educating the patient/family/caregiver Ordering medications, tests, or procedures Referring and communicating with other health care professionals (when not separately reported) Documenting clinical information in  the electronic or other health record Independently interpreting results (not separately reported) and communicating results to the patient/ family/caregiver Care coordination (not separately reported)  Note by: Edward Jolly, MD Date: 08/16/2023; Time: 11:43 AM

## 2023-08-16 NOTE — Progress Notes (Signed)
Nursing Pain Medication Assessment:  Safety precautions to be maintained throughout the outpatient stay will include: orient to surroundings, keep bed in low position, maintain call bell within reach at all times, provide assistance with transfer out of bed and ambulation.  Medication Inspection Compliance: Pill count conducted under aseptic conditions, in front of the patient. Neither the pills nor the bottle was removed from the patient's sight at any time. Once count was completed pills were immediately returned to the patient in their original bottle.  Medication: Hydrocodone/APAP Pill/Patch Count:  16 of 120 pills remain Pill/Patch Appearance: Markings consistent with prescribed medication Bottle Appearance: Standard pharmacy container. Clearly labeled. Filled Date: 1 / 1 / 2025 Last Medication intake:  Today

## 2023-08-23 ENCOUNTER — Other Ambulatory Visit: Payer: Self-pay | Admitting: Student

## 2023-08-23 DIAGNOSIS — S46011D Strain of muscle(s) and tendon(s) of the rotator cuff of right shoulder, subsequent encounter: Secondary | ICD-10-CM

## 2023-08-23 DIAGNOSIS — M7521 Bicipital tendinitis, right shoulder: Secondary | ICD-10-CM

## 2023-08-23 DIAGNOSIS — G8929 Other chronic pain: Secondary | ICD-10-CM

## 2023-08-23 DIAGNOSIS — M7581 Other shoulder lesions, right shoulder: Secondary | ICD-10-CM

## 2023-08-29 ENCOUNTER — Ambulatory Visit
Admission: RE | Admit: 2023-08-29 | Discharge: 2023-08-29 | Disposition: A | Discharge status: Home / Self Care | Payer: Medicare HMO | Source: Ambulatory Visit | Attending: Student | Admitting: Student

## 2023-08-29 DIAGNOSIS — M25511 Pain in right shoulder: Secondary | ICD-10-CM | POA: Insufficient documentation

## 2023-08-29 DIAGNOSIS — G8929 Other chronic pain: Secondary | ICD-10-CM | POA: Insufficient documentation

## 2023-08-29 DIAGNOSIS — S46011D Strain of muscle(s) and tendon(s) of the rotator cuff of right shoulder, subsequent encounter: Secondary | ICD-10-CM | POA: Diagnosis present

## 2023-08-29 DIAGNOSIS — M7521 Bicipital tendinitis, right shoulder: Secondary | ICD-10-CM | POA: Insufficient documentation

## 2023-08-29 DIAGNOSIS — M7581 Other shoulder lesions, right shoulder: Secondary | ICD-10-CM | POA: Diagnosis present

## 2023-09-06 ENCOUNTER — Ambulatory Visit
Payer: Medicare HMO | Attending: Student in an Organized Health Care Education/Training Program | Admitting: Student in an Organized Health Care Education/Training Program

## 2023-09-06 ENCOUNTER — Encounter: Payer: Self-pay | Admitting: Student in an Organized Health Care Education/Training Program

## 2023-09-06 VITALS — BP 156/85 | HR 70 | Temp 97.2°F | Ht 72.0 in | Wt 224.0 lb

## 2023-09-06 DIAGNOSIS — M792 Neuralgia and neuritis, unspecified: Secondary | ICD-10-CM

## 2023-09-06 DIAGNOSIS — M961 Postlaminectomy syndrome, not elsewhere classified: Secondary | ICD-10-CM

## 2023-09-06 DIAGNOSIS — G8929 Other chronic pain: Secondary | ICD-10-CM | POA: Diagnosis present

## 2023-09-06 DIAGNOSIS — M25571 Pain in right ankle and joints of right foot: Secondary | ICD-10-CM

## 2023-09-06 DIAGNOSIS — M25572 Pain in left ankle and joints of left foot: Secondary | ICD-10-CM | POA: Diagnosis not present

## 2023-09-06 DIAGNOSIS — Z96651 Presence of right artificial knee joint: Secondary | ICD-10-CM | POA: Diagnosis not present

## 2023-09-06 DIAGNOSIS — M47816 Spondylosis without myelopathy or radiculopathy, lumbar region: Secondary | ICD-10-CM | POA: Diagnosis present

## 2023-09-06 DIAGNOSIS — M25561 Pain in right knee: Secondary | ICD-10-CM | POA: Diagnosis not present

## 2023-09-06 DIAGNOSIS — Z79899 Other long term (current) drug therapy: Secondary | ICD-10-CM | POA: Diagnosis present

## 2023-09-06 DIAGNOSIS — M5416 Radiculopathy, lumbar region: Secondary | ICD-10-CM | POA: Diagnosis present

## 2023-09-06 DIAGNOSIS — Z79891 Long term (current) use of opiate analgesic: Secondary | ICD-10-CM | POA: Diagnosis present

## 2023-09-06 DIAGNOSIS — G894 Chronic pain syndrome: Secondary | ICD-10-CM | POA: Diagnosis present

## 2023-09-06 MED ORDER — HYDROCODONE-ACETAMINOPHEN 10-325 MG PO TABS: 1.0000 | ORAL_TABLET | Freq: Four times a day (QID) | ORAL | 0 refills | Status: DC | PRN

## 2023-09-06 NOTE — Progress Notes (Signed)
 Safety precautions to be maintained throughout the outpatient stay will include: orient to surroundings, keep bed in low position, maintain call bell within reach at all times, provide assistance with transfer out of bed and ambulation.   Nursing Pain Medication Assessment:  Safety precautions to be maintained throughout the outpatient stay will include: orient to surroundings, keep bed in low position, maintain call bell within reach at all times, provide assistance with transfer out of bed and ambulation.  Medication Inspection Compliance: Pill count conducted under aseptic conditions, in front of the patient. Neither the pills nor the bottle was removed from the patient's sight at any time. Once count was completed pills were immediately returned to the patient in their original bottle.  Medication: Hydrocodone/APAP Pill/Patch Count:  45 of 120 pills remain Pill/Patch Appearance: Markings consistent with prescribed medication Bottle Appearance: Standard pharmacy container. Clearly labeled. Filled Date: 2 / 23 / 2025 Last Medication intake:  Today

## 2023-09-06 NOTE — Progress Notes (Signed)
 PROVIDER NOTE: Information contained herein reflects review and annotations entered in association with encounter. Interpretation of such information and data should be left to medically-trained personnel. Information provided to patient can be located elsewhere in the medical record under "Patient Instructions". Document created using STT-dictation technology, any transcriptional errors that may result from process are unintentional.    Patient: Perry Jones  Service Category: E/M  Provider: Edward Jolly, MD  DOB: 1951-02-09  DOS: 09/06/2023  Referring Provider: Dorothey Baseman, MD  MRN: [id number]  Specialty: Interventional Pain Management  PCP: Dorothey Baseman, MD  Type: Established Patient  Setting: Ambulatory outpatient    Location: Office  Delivery: Face-to-face     HPI  Perry Jones, a 73 y.o. year old male, is here today because of his History of total right knee replacement [Z96.651]. Mr. Mccrone primary complain today is Knee Pain (Right and lower back)  Pertinent problems: Mr. Yost has Back muscle spasm; Chronic knee pain after total replacement of right knee joint; DDD (degenerative disc disease), lumbar; Essential hypertension; Chronic radicular lumbar pain; History of total right knee replacement; History of lumbar fusion (L1-L3); Chronic bilateral low back pain with bilateral sciatica; Chronic pain syndrome; Lumbar facet arthropathy; and Piriformis syndrome, right on their pertinent problem list. Pain Assessment: Severity of Chronic pain is reported as a 5 /10. Location: Knee Right (and lower back)/radiates to right foot. Onset: More than a month ago. Quality: Constant, Pressure. Timing:  . Modifying factor(s): meds. Vitals:  height is 6' (1.829 m) and weight is 224 lb (101.6 kg). His temperature is 97.2 F (36.2 C) (abnormal). His blood pressure is 156/85 (abnormal) and his pulse is 70. His oxygen saturation is 100%.  BMI: Estimated body mass index is 30.38 kg/m as  calculated from the following:   Height as of this encounter: 6' (1.829 m).   Weight as of this encounter: 224 lb (101.6 kg). Last encounter: 03/15/2023. Last procedure: 12/20/2022.  Reason for encounter: medication management   History of Present Illness   The patient presents with foot pain for follow-up management.  He is following up on a previously discussed treatment plan for his foot pain. He has not yet received an appointment for the procedure: Ankle block  He has been using a compounded medication for his foot, obtained from a [facility] pharmacy in [place]. This compound is less effective than Voltaren, which provides more significant relief. The compound reduces pain slightly, indicating inflammation might be a significant component of his pain.  He is currently using hydrocodone, which is being refilled today.       Pharmacotherapy Assessment  Analgesic: hydrocodone 10 mg every 6 hours as needed prn    Monitoring: [place] PMP: PDMP reviewed during this encounter.       Pharmacotherapy: No side-effects or adverse reactions reported. Compliance: No problems identified. Effectiveness: Clinically acceptable.  Florina Ou, RN  09/06/2023  8:58 AM  Sign when Signing Visit Safety precautions to be maintained throughout the outpatient stay will include: orient to surroundings, keep bed in low position, maintain call bell within reach at all times, provide assistance with transfer out of bed and ambulation.   Nursing Pain Medication Assessment:  Safety precautions to be maintained throughout the outpatient stay will include: orient to surroundings, keep bed in low position, maintain call bell within reach at all times, provide assistance with transfer out of bed and ambulation.  Medication Inspection Compliance: Pill count conducted under aseptic conditions, in front of the patient. Neither  the pills nor the bottle was removed from the patient's sight at any time. Once count was  completed pills were immediately returned to the patient in their original bottle.  Medication: Hydrocodone/APAP Pill/Patch Count:  45 of 120 pills remain Pill/Patch Appearance: Markings consistent with prescribed medication Bottle Appearance: Standard pharmacy container. Clearly labeled. Filled Date: 2 / 23 / 2025 Last Medication intake:  Today    No results found for: "CBDTHCR" No results found for: "D8THCCBX" No results found for: "D9THCCBX"  UDS:  Summary  Date Value Ref Range Status  10/10/2022 Note  Final    Comment:    ==================================================================== [employer]ox[employer] Select 13 (MW) ==================================================================== Test                             Result       Flag       Units  Drug Present and Declared for Prescription Verification   Hydrocodone                    1673         EXPECTED   ng/mg creat   Hydromorphone                  663          EXPECTED   ng/mg creat   Dihydrocodeine                 137          EXPECTED   ng/mg creat   Norhydrocodone                 2245         EXPECTED   ng/mg creat    Sources of hydrocodone include scheduled prescription medications.    Hydromorphone, dihydrocodeine and norhydrocodone are expected    metabolites of hydrocodone. Hydromorphone and dihydrocodeine are    also available as scheduled prescription medications.  Drug Absent but Declared for Prescription Verification   Lorazepam                      Not Detected UNEXPECTED ng/mg creat ==================================================================== Test                      Result    Flag   Units      Ref Range   Creatinine              101              mg/dL      >=29 ==================================================================== Declared Medications:  The flagging and interpretation on this report are based on the  following declared medications.  Unexpected results may arise from  inaccuracies in  the declared medications.   **Note: The testing scope of this panel includes these medications:   Hydrocodone (Norco)  Lorazepam (Ativan)   **Note: The testing scope of this panel does not include the  following reported medications:   Acetaminophen (Norco)  Benazepril (Lotensin)  Biotin  Calcium  Cyclobenzaprine (Flexeril)  Diclofenac (Voltaren)  Docusate (Colace)  Ezetimibe (Zetia)  Loratadine  Multivitamin  Naproxen (Naprosyn)  Pregabalin (Lyrica)  Supplement  Vitamin C  Zolpidem (Ambien) ==================================================================== For clinical consultation, please call [phone]. ====================================================================       ROS  Constitutional: Denies any fever or chills Gastrointestinal: No reported hemesis, hematochezia, vomiting, or acute GI distress Musculoskeletal:  right knee pain  right ankle pain Neurological: No reported episodes of acute onset apraxia, aphasia, dysarthria, agnosia, amnesia, paralysis, loss of coordination, or loss of consciousness  Medication Review  Biotin, Calcium Polycarbophil, Echinacea, HYDROcodone-acetaminophen, LORazepam, Loratadine, Multi-Vitamins, NONFORMULARY OR COMPOUNDED ITEM, Vitamin C, benazepril-hydrochlorthiazide, cyclobenzaprine, diclofenac Sodium, docusate sodium, ezetimibe, ketoconazole, naproxen, omeprazole, pregabalin, tamsulosin, and zolpidem  History Review  Allergy: Mr. Hudler is allergic to gluten meal, pine tar, pistachio nut (diagnostic), pistachio nut extract, and prednisone. Drug: Mr. Lust  reports no history of drug use. Alcohol:  reports current alcohol use. Tobacco:  reports that he quit smoking about 38 years ago. His smoking use included cigarettes. He started smoking about 56 years ago. He has a 36 pack-year smoking history. He has never used smokeless tobacco. Social: Mr. Rideaux  reports that he quit smoking about 38 years ago. His smoking use  included cigarettes. He started smoking about 56 years ago. He has a 36 pack-year smoking history. He has never used smokeless tobacco. He reports current alcohol use. He reports that he does not use drugs. Medical:  has a past medical history of Actinic keratosis, Dysplastic nevus (03/11/2020), Dysplastic nevus (03/12/2019), High cholesterol, Hypertension, and Sinus trouble. Surgical: Mr. Meinders  has a past surgical history that includes Knee arthroscopy (Right); Colonoscopy with propofol (N/A, 11/22/2017); Cholecystectomy; Lumbar fusion (N/A, 2000); Skin cancer excision; Replacement total knee (Right, 2007); Spinal cord stimulator insertion (N/A, 02/21/2021); Spinal cord stimulator implant (02/21/2021); Colonoscopy with propofol (N/A, 04/23/2023); and polypectomy (04/23/2023). Family: family history includes Dementia in his mother; Heart Problems in his mother; Lung cancer in his father.  Laboratory Chemistry Profile   Renal Lab Results  Component Value Date   BUN 12 02/10/2021   CREATININE 0.72 02/10/2021   GFRAA >60 07/14/2018   GFRNONAA >60 02/10/2021    Hepatic Lab Results  Component Value Date   AST 45 (H) 09/28/2020   ALT 37 09/28/2020   ALBUMIN 4.5 09/28/2020   ALKPHOS 44 09/28/2020   LIPASE 28 07/14/2018    Electrolytes Lab Results  Component Value Date   NA 139 02/10/2021   K 3.5 02/10/2021   CL 105 02/10/2021   CALCIUM 9.6 02/10/2021    Bone Lab Results  Component Value Date   TESTOSTERONE 45 (L) 09/28/2020    Inflammation (CRP: Acute Phase) (ESR: Chronic Phase) No results found for: "CRP", "ESRSEDRATE", "LATICACIDVEN"       Note: Above Lab results reviewed.  Recent Imaging Review  DG PAIN CLINIC C-ARM 1-60 MIN NO REPORT Fluoro was used, but no Radiologist interpretation will be provided.  Please refer to "NOTES" tab for provider progress note. Note: Reviewed        Physical Exam  General appearance: Well nourished, well developed, and well hydrated. In no  apparent acute distress Mental status: Alert, oriented x 3 (person, place, & time)       Respiratory: No evidence of acute respiratory distress Eyes: PERLA Vitals: BP (!) 156/85   Pulse 70   Temp (!) 97.2 F (36.2 C)   Ht 6' (1.829 m)   Wt 224 lb (101.6 kg)   SpO2 100%   BMI 30.38 kg/m  BMI: Estimated body mass index is 30.38 kg/m as calculated from the following:   Height as of this encounter: 6' (1.829 m).   Weight as of this encounter: 224 lb (101.6 kg). Ideal: Ideal body weight: 77.6 kg (171 lb 1.2 oz) Adjusted ideal body weight: 87.2 kg (192 lb 3.9 oz)  Right knee pain, worse with weight  bearing Right ankle pain  Assessment   Diagnosis Status  1. History of total right knee replacement   2. Chronic knee pain after total replacement of right knee joint   3. Neuropathic pain of right ankle   4. Neuropathic pain of left ankle   5. Failed back surgical syndrome   6. Lumbar facet arthropathy   7. Pharmacologic therapy   8. Chronic radicular lumbar pain   9. Chronic pain syndrome   10. Chronic use of opiate for therapeutic purpose    Controlled Controlled Controlled   Updated Problems: No problems updated.  Plan of Care  Problem-specific:  Assessment and Plan    Foot pain   He experiences foot pain and has a treatment plan in place but has not yet received an appointment for his right ankle block he uses a compounded cream, which is less effective than Voltaren, indicating the pain may be more related to inflammation. Ketamine in the cream was expected to address neuropathic pain, but Voltaren reduces pain by one to two points. Confirm the appointment status for foot evaluation with the front desk and continue using Voltaren for pain management.  Chronic pain management   He is on hydrocodone and Lyrica for chronic pain. Hydrocodone provides some relief, with a refill due on March 25th. Lyrica is managed by his primary care provider. Refill the hydrocodone  prescription on March 25th and confirm Lyrica management with the primary care provider.  Medication management   He is on multiple medications for pain, including hydrocodone and Lyrica. The compounded cream with ketamine was less effective than expected, suggesting inflammation is a significant component of his pain. Ketamine was anticipated to help with neuropathic pain, but Voltaren proved more effective. Update the urine screen today and review the medication regimen in three months.       Mr. London Tarnowski has a current medication list which includes the following long-term medication(s): benazepril-hydrochlorthiazide, loratadine, omeprazole, [START ON 09/18/2023] hydrocodone-acetaminophen, [START ON 10/18/2023] hydrocodone-acetaminophen, and [START ON 11/17/2023] hydrocodone-acetaminophen.  Pharmacotherapy (Medications Ordered): Meds ordered this encounter  Medications   HYDROcodone-acetaminophen (NORCO) 10-325 MG tablet    Sig: Take 1 tablet by mouth every 6 (six) hours as needed for severe pain (pain score 7-10). Must last 30 days.    Dispense:  120 tablet    Refill:  0    Chronic Pain: STOP Act (Not applicable) Fill 1 day early if closed on refill date. Avoid benzodiazepines within 8 hours of opioids   HYDROcodone-acetaminophen (NORCO) 10-325 MG tablet    Sig: Take 1 tablet by mouth every 6 (six) hours as needed for severe pain (pain score 7-10). Must last 30 days.    Dispense:  120 tablet    Refill:  0    Chronic Pain: STOP Act (Not applicable) Fill 1 day early if closed on refill date. Avoid benzodiazepines within 8 hours of opioids   HYDROcodone-acetaminophen (NORCO) 10-325 MG tablet    Sig: Take 1 tablet by mouth every 6 (six) hours as needed for severe pain (pain score 7-10). Must last 30 days.    Dispense:  120 tablet    Refill:  0    Chronic Pain: STOP Act (Not applicable) Fill 1 day early if closed on refill date. Avoid benzodiazepines within 8 hours of opioids    Orders:  Orders Placed This Encounter  Procedures   ToxASSURE Select 13 (MW), Urine    Volume: 30 ml(s). Minimum 3 ml of urine is needed. Document temperature  of fresh sample. Indications: Long term (current) use of opiate analgesic (W09.811)    Release to patient:   Immediate   Follow-up plan:   Return in about 3 months (around 12/07/2023) for MM, F2F.      Recent Visits Date Type Provider Dept  08/16/23 Office Visit Edward Jolly, MD [facility]  07/16/23 Procedure visit Edward Jolly, MD [facility]  06/12/23 Office Visit Edward Jolly, MD [facility]  Showing recent visits within past 90 days and meeting all other requirements Today's Visits Date Type Provider Dept  09/06/23 Office Visit Edward Jolly, MD [facility]  Showing today's visits and meeting all other requirements Future Appointments Date Type Provider Dept  12/04/23 Appointment Edward Jolly, MD [facility]  Showing future appointments within next 90 days and meeting all other requirements  I discussed the assessment and treatment plan with the patient. The patient was provided an opportunity to ask questions and all were answered. The patient agreed with the plan and demonstrated an understanding of the instructions.  Patient advised to call back or seek an in-person evaluation if the symptoms or condition worsens.  Duration of encounter: .  Total time on encounter, as per AMA guidelines included both the face-to-face and non-face-to-face time personally spent by the physician and/or other qualified health care professional(s) on the day of the encounter (includes time in activities that require the physician or other qualified health care professional and does not include time in activities normally performed by clinical staff). Physician's time may include the following activities when performed: Preparing to see the patient (e.g., pre-charting review  of records, searching for previously ordered imaging, lab work, and nerve conduction tests) Review of prior analgesic pharmacotherapies. Reviewing PMP Interpreting ordered tests (e.g., lab work, imaging, nerve conduction tests) Performing post-procedure evaluations, including interpretation of diagnostic procedures Obtaining and/or reviewing separately obtained history Performing a medically appropriate examination and/or evaluation Counseling and educating the patient/family/caregiver Ordering medications, tests, or procedures Referring and communicating with other health care professionals (when not separately reported) Documenting clinical information in the electronic or other health record Independently interpreting results (not separately reported) and communicating results to the patient/ family/caregiver Care coordination (not separately reported)  Note by: Edward Jolly, MD Date: 09/06/2023; Time: 10:03 AM

## 2023-09-12 LAB — TOXASSURE SELECT 13 (MW), URINE

## 2023-09-13 ENCOUNTER — Encounter: Payer: Medicare HMO | Admitting: Student in an Organized Health Care Education/Training Program

## 2023-09-28 DIAGNOSIS — M7581 Other shoulder lesions, right shoulder: Secondary | ICD-10-CM | POA: Insufficient documentation

## 2023-09-28 DIAGNOSIS — M12811 Other specific arthropathies, not elsewhere classified, right shoulder: Secondary | ICD-10-CM | POA: Insufficient documentation

## 2023-09-28 DIAGNOSIS — S46011A Strain of muscle(s) and tendon(s) of the rotator cuff of right shoulder, initial encounter: Secondary | ICD-10-CM | POA: Insufficient documentation

## 2023-11-15 ENCOUNTER — Encounter: Payer: Self-pay | Admitting: Dermatology

## 2023-11-15 ENCOUNTER — Ambulatory Visit: Admitting: Dermatology

## 2023-11-15 DIAGNOSIS — L300 Nummular dermatitis: Secondary | ICD-10-CM | POA: Diagnosis not present

## 2023-11-15 MED ORDER — TACROLIMUS 0.1 % EX OINT: TOPICAL_OINTMENT | CUTANEOUS | 2 refills | Status: DC

## 2023-11-15 NOTE — Progress Notes (Unsigned)
   Follow Up Visit   Subjective  Perry Jones is a 73 y.o. male who presents for the following: Rash. C/O poison ivy since April 1st. Started on left ankle has spread on both legs, torso, groin. Was seen at [facility] was prescribed Triamcinolone 0.1% cream. States it made him sick. He is allergic to steroids. After using the [facility] the rash changed appearance and got larger. Itched in the beginning, not as itchy now.   No personal or family history of psoriasis.   The following portions of the chart were reviewed this encounter and updated as appropriate: medications, allergies, medical history  Review of Systems:  No other skin or systemic complaints except as noted in HPI or Assessment and Plan.  Objective  Well appearing patient in no apparent distress; mood and affect are within normal limits.  A focused examination was performed of the following areas: Arms, legs, torso  Relevant exam findings are noted in the Assessment and Plan.                     Assessment & Plan   NUMMULAR DERMATITIS    Nummular Dermatitis Exam: Pink scaly oval to annular plaques scattered at R > L lower legs, R knee, R arm. 5% BSA  Chronic and persistent condition with duration or expected duration over one year. Condition is bothersome/symptomatic for patient. Currently flared.   Nummular dermatitis (eczema) is a chronic, relapsing, itchy rash that can significantly affect quality of life. It is often associated with dry skin and flares in the wintertime, and may require treatment with prescription topical anti-inflammatory medications, in addition to gentle skin care.  If there is associated atopic dermatitis and topicals are not working, then biologic injections may be necessary to clear rash and control symptoms.  Treatment Plan:  Discussed Dupixent injections or topical treatments. Will send Tacrolimus in for patient to try. If not improving with Tacrolimus ointment will start  Dupixent at next visit.   Start Tacrolimus ointment twice a day to affected areas on body.   Recommend mild soap and moisturizing cream 1-2 times daily.  Gentle skin care handout provided.      Return in about 1 month (around 12/16/2023).  I, Jill Parcell, CMA, am acting as scribe for Harris Liming, MD.   Documentation: I have reviewed the above documentation for accuracy and completeness, and I agree with the above.  Harris Liming, MD

## 2023-11-15 NOTE — Patient Instructions (Addendum)
 Start Tacrolimus ointment twice a day to affected areas on body until clear.     Dupilumab (Dupixent) is a treatment given by injection for adults and children with moderate-to-severe atopic dermatitis. Goal is control of skin condition, not cure. It is given as 2 injections at the first dose followed by 1 injection ever 2 weeks thereafter.  Young children are dosed monthly.  Potential side effects include allergic reaction, herpes infections, injection site reactions and conjunctivitis (inflammation of the eyes).  The use of Dupixent requires long term medication management, including periodic office visits.    Gentle Skin Care Guide  1. Bathe no more than once a day.  2. Avoid bathing in hot water  3. Use a mild soap like Dove, Vanicream, Cetaphil, CeraVe. Can use Lever 2000 or Cetaphil antibacterial soap  4. Use soap only where you need it. On most days, use it under your arms, between your legs, and on your feet. Let the water rinse other areas unless visibly dirty.  5. When you get out of the bath/shower, use a towel to gently blot your skin dry, don't rub it.  6. While your skin is still a little damp, apply a moisturizing cream such as Vanicream, CeraVe, Cetaphil, Eucerin, Sarna lotion or plain Vaseline Jelly. For hands apply Neutrogena [place] Hand Cream or Excipial Hand Cream.  7. Reapply moisturizer any time you start to itch or feel dry.  8. Sometimes using free and clear laundry detergents can be helpful. Fabric softener sheets should be avoided. Downy Free & Gentle liquid, or any liquid fabric softener that is free of dyes and perfumes, it acceptable to use  9. If your doctor has given you prescription creams you may apply moisturizers over them        Due to recent changes in healthcare laws, you may see results of your pathology and/or laboratory studies on MyChart before the doctors have had a chance to review them. We understand that in some cases there may be  results that are confusing or concerning to you. Please understand that not all results are received at the same time and often the doctors may need to interpret multiple results in order to provide you with the best plan of care or course of treatment. Therefore, we ask that you please give us  2 business days to thoroughly review all your results before contacting the office for clarification. Should we see a critical lab result, you will be contacted sooner.   If You Need Anything After Your Visit  If you have any questions or concerns for your doctor, please call our main line at [phone] and press option 4 to reach your doctor's medical assistant. If no one answers, please leave a voicemail as directed and we will return your call as soon as possible. Messages left after 4 pm will be answered the following business day.   You may also send us  a message via MyChart. We typically respond to MyChart messages within 1-2 business days.  For prescription refills, please ask your pharmacy to contact our office. Our fax number is [phone].  If you have an urgent issue when the clinic is closed that cannot wait until the next business day, you can page your doctor at the number below.    Please note that while we do our best to be available for urgent issues outside of office hours, we are not available 24/7.   If you have an urgent issue and are unable to reach us ,  you may choose to seek medical care at your doctor's office, retail clinic, urgent care center, or emergency room.  If you have a medical emergency, please immediately call 911 or go to the emergency department.  Pager Numbers  - Dr. Bary Likes: [phone]  - Dr. Annette Barters: [phone]  - Dr. Felipe Horton: [phone]   In the event of inclement weather, please call our main line at [phone] for an update on the status of any delays or closures.  Dermatology Medication Tips: Please keep the boxes that topical medications  come in in order to help keep track of the instructions about where and how to use these. Pharmacies typically print the medication instructions only on the boxes and not directly on the medication tubes.   If your medication is too expensive, please contact our office at [phone] option 4 or send us  a message through MyChart.   We are unable to tell what your co-pay for medications will be in advance as this is different depending on your insurance coverage. However, we may be able to find a substitute medication at lower cost or fill out paperwork to get insurance to cover a needed medication.   If a prior authorization is required to get your medication covered by your insurance company, please allow us  1-2 business days to complete this process.  Drug prices often vary depending on where the prescription is filled and some pharmacies may offer cheaper prices.  The website www.goodrx.com contains coupons for medications through different pharmacies. The prices here do not account for what the cost may be with help from insurance (it may be cheaper with your insurance), but the website can give you the price if you did not use any insurance.  - You can print the associated coupon and take it with your prescription to the pharmacy.  - You may also stop by our office during regular business hours and pick up a GoodRx coupon card.  - If you need your prescription sent electronically to a different pharmacy, notify our office through [facility] or by phone at [phone] option 4.     Si Usted Necesita Algo Despus de Su Visita  Tambin puede enviarnos un mensaje a travs de Clinical cytogeneticist. Por lo general respondemos a los mensajes de MyChart en el transcurso de 1 a 2 das hbiles.  Para renovar recetas, por favor pida a su farmacia que se ponga en contacto con nuestra oficina. Franz Jacks de fax es [place] [phone].  Si tiene un asunto urgente cuando la clnica est cerrada y que no  puede esperar hasta el siguiente da hbil, puede llamar/localizar a su doctor(a) al nmero que aparece a continuacin.   Por favor, tenga en cuenta que aunque hacemos todo lo posible para estar disponibles para asuntos urgentes fuera del horario de [place], no estamos disponibles las 24 horas del da, los 7 [address]  [address] de la [place].   Si tiene un problema urgente y no puede comunicarse con nosotros, puede optar por buscar atencin mdica  en el consultorio de su doctor(a), en una clnica privada, en un centro de atencin urgente o en una sala de emergencias.  Si tiene Engineer, drilling, por favor llame inmediatamente al 911 o vaya a la sala de emergencias.  Nmeros de bper  - Dr. Bary Likes: [phone]  - Dra. Annette Barters: [phone]  - Dr. Felipe Horton: [phone]   En caso de inclemencias del tiempo, por favor llame a Lajuan Pila principal al [phone] para una actualizacin sobre el estado de cualquier  retraso o cierre.  Consejos para la medicacin en dermatologa: Por favor, guarde las cajas en las que vienen los medicamentos de uso tpico para ayudarle a seguir las instrucciones sobre dnde y cmo usarlos. Las farmacias generalmente imprimen las instrucciones del medicamento slo en las cajas y no directamente en los tubos del [place].   Si su medicamento es muy caro, por favor, pngase en contacto con Bettyjane Brunet llamando al [phone] y presione la opcin 4 o envenos un mensaje a travs de Clinical cytogeneticist.   No podemos decirle cul ser su copago por los medicamentos por adelantado ya que esto es diferente dependiendo de la cobertura de su seguro. Sin embargo, es posible que podamos encontrar un medicamento sustituto a Audiological scientist un formulario para que el seguro cubra el medicamento que se considera necesario.   Si se requiere una autorizacin previa para que su compaa de seguros [place] su medicamento, por favor permtanos de 1 a 2 das hbiles para completar este  proceso.  Los precios de los medicamentos varan con frecuencia dependiendo del Environmental consultant de dnde se surte la receta y alguna farmacias pueden ofrecer precios ms baratos.  El sitio web www.goodrx.com tiene cupones para medicamentos de Health and safety inspector. Los precios aqu no tienen en cuenta lo que podra costar con la ayuda del seguro (puede ser ms barato con su seguro), pero el sitio web puede darle el precio si no utiliz Tourist information centre manager.  - Puede imprimir el cupn correspondiente y llevarlo con su receta a la farmacia.  - Tambin puede pasar por nuestra oficina durante el horario de atencin regular y Education officer, museum una tarjeta de cupones de GoodRx.  - Si necesita que su receta se enve electrnicamente a una farmacia diferente, informe a nuestra oficina a travs de MyChart de [place] o por telfono llamando al [phone] y presione la opcin 4.

## 2023-12-04 ENCOUNTER — Encounter: Payer: Self-pay | Admitting: Nurse Practitioner

## 2023-12-04 ENCOUNTER — Ambulatory Visit: Attending: Student in an Organized Health Care Education/Training Program | Admitting: Nurse Practitioner

## 2023-12-04 VITALS — BP 131/85 | HR 72 | Temp 97.7°F | Ht 72.0 in | Wt 235.0 lb

## 2023-12-04 DIAGNOSIS — Z79899 Other long term (current) drug therapy: Secondary | ICD-10-CM | POA: Insufficient documentation

## 2023-12-04 DIAGNOSIS — M5416 Radiculopathy, lumbar region: Secondary | ICD-10-CM | POA: Insufficient documentation

## 2023-12-04 DIAGNOSIS — M25561 Pain in right knee: Secondary | ICD-10-CM | POA: Insufficient documentation

## 2023-12-04 DIAGNOSIS — Z96651 Presence of right artificial knee joint: Secondary | ICD-10-CM | POA: Insufficient documentation

## 2023-12-04 DIAGNOSIS — Z79891 Long term (current) use of opiate analgesic: Secondary | ICD-10-CM | POA: Diagnosis not present

## 2023-12-04 DIAGNOSIS — G894 Chronic pain syndrome: Secondary | ICD-10-CM | POA: Diagnosis not present

## 2023-12-04 DIAGNOSIS — M961 Postlaminectomy syndrome, not elsewhere classified: Secondary | ICD-10-CM | POA: Insufficient documentation

## 2023-12-04 DIAGNOSIS — G8929 Other chronic pain: Secondary | ICD-10-CM | POA: Insufficient documentation

## 2023-12-04 MED ORDER — HYDROCODONE-ACETAMINOPHEN 10-325 MG PO TABS: 1.0000 | ORAL_TABLET | Freq: Four times a day (QID) | ORAL | 0 refills | Status: DC | PRN

## 2023-12-04 NOTE — Progress Notes (Signed)
 Safety precautions to be maintained throughout the outpatient stay will include: orient to surroundings, keep bed in low position, maintain call bell within reach at all times, provide assistance with transfer out of bed and ambulation.   Nursing Pain Medication Assessment:  Safety precautions to be maintained throughout the outpatient stay will include: orient to surroundings, keep bed in low position, maintain call bell within reach at all times, provide assistance with transfer out of bed and ambulation.  Medication Inspection Compliance: Pill count conducted under aseptic conditions, in front of the patient. Neither the pills nor the bottle was removed from the patient's sight at any time. Once count was completed pills were immediately returned to the patient in their original bottle.  Medication: Hydrocodone /APAP Pill/Patch Count: 89 of 120 pills/patches remain Pill/Patch Appearance: Markings consistent with prescribed medication Bottle Appearance: Standard pharmacy container. Clearly labeled. Filled Date: 5 / 30 / 2025 Last Medication intake:  Today

## 2023-12-04 NOTE — Progress Notes (Addendum)
 PROVIDER NOTE: Interpretation of information contained herein should be left to medically-trained personnel. Specific patient instructions are provided elsewhere under "Patient Instructions" section of medical record. This document was created in part using AI and STT-dictation technology, any transcriptional errors that may result from this process are unintentional.  Patient: Perry Jones  Service: E/M   PCP: Rory Collard, MD  DOB: [birth year]  DOS: 12/04/2023  Provider: Cherylin Corrigan, NP  MRN: [id number]  Delivery: Face-to-face  Specialty: Interventional Pain Management  Type: Established Patient  Setting: Ambulatory outpatient facility  Specialty designation: 09  Referring Prov.: Rory Collard, MD  Location: Outpatient office facility       History of present illness (HPI) [name], a 73 y.o. 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 [name] primary complain today is Back Pain (Lower back and right knee)  Pertinent problems: [name] has Chronic knee pain after total replacement of right knee joint; DDD (degenerative disc disease), lumbar; Essential hypertension; Chronic radicular lumbar pain; Back muscle spasm; History of total right knee replacement; History of lumbar fusion (L1-L3); Chronic bilateral low back pain with bilateral sciatica; Chronic pain syndrome; Lumbar facet arthropathy and Piriformis syndrome (Right) on their pertinent problem list.  Pain Assessment: Severity of Chronic pain is reported as a 7 /10. Location: Back Lower/radiates down right leg to tops of toes. Onset: More than a month ago. Quality: Burning (pinching). Timing: Constant. Modifying factor(s): meds, ice. Vitals:  height is 6' (1.829 m) and weight is 235 lb (106.6 kg). His temperature is 97.7 F (36.5 C). His blood pressure is 131/85 and his pulse is 72. His oxygen saturation is 99%.  BMI: Estimated  body mass index is 31.87 kg/m as calculated from the following:   Height as of this encounter: 6' (1.829 m).   Weight as of this encounter: 235 lb (106.6 kg).  Last encounter: 09/06/2023 Last procedure: 07/16/2023  Reason for encounter: medication management and request to repeat right RFA. No change in medical history since last visit.  Patient's pain is at baseline.  Patient continues multimodal pain regimen as prescribed.  States that it provides pain relief and improvement in functional status.  The patient has a history of right knee issues, which have been managed with spinal cord stimulator and genicular nerve ablation therapy.  The genicular nerve ablation were performed on December 20, 2022 in July 16, 2023, each providing approximately 55% pain relief lasting around 64-months.  He expresses interest in repeating the ablation, with the stimulator turned off.   Pharmacotherapy Assessment   Analgesic: Hydrocodone -acetaminophen  (Norco) 10-325 mg tablet every 6 hours as needed for severe pain.Aaron Aas MME=40 Monitoring: [place] PMP: PDMP reviewed during this encounter.       Pharmacotherapy: No side-effects or adverse reactions reported. Compliance: No problems identified. Effectiveness: Clinically acceptable.  Merilyn Staple, RN  12/04/2023  8:34 AM  Sign when Signing Visit Safety precautions to be maintained throughout the outpatient stay will include: orient to surroundings, keep bed in low position, maintain call bell within reach at all times, provide assistance with transfer out of bed and ambulation.   Nursing Pain Medication Assessment:  Safety precautions to be maintained throughout the outpatient stay will include: orient to surroundings, keep bed in low position, maintain call bell within reach at all times, provide assistance with transfer out of bed and ambulation.  Medication Inspection Compliance: Pill count conducted under  aseptic conditions, in front of the patient. Neither the  pills nor the bottle was removed from the patient's sight at any time. Once count was completed pills were immediately returned to the patient in their original bottle.  Medication: Hydrocodone /APAP Pill/Patch Count: 89 of 120 pills/patches remain Pill/Patch Appearance: Markings consistent with prescribed medication Bottle Appearance: Standard pharmacy container. Clearly labeled. Filled Date: 5 / 30 / 2025 Last Medication intake:  Today       UDS:  Summary  Date Value Ref Range Status  09/06/2023 FINAL  Final    Comment:    ==================================================================== [employer] [facility] (MW) ==================================================================== Test                             Result       Flag       Units  Drug Present and Declared for Prescription Verification   Lorazepam                      361          EXPECTED   ng/mg creat    Source of lorazepam is a scheduled prescription medication.    Hydrocodone                     5352         EXPECTED   ng/mg creat   Hydromorphone                  759          EXPECTED   ng/mg creat   Dihydrocodeine                 292          EXPECTED   ng/mg creat   Norhydrocodone                 3597         EXPECTED   ng/mg creat    Sources of hydrocodone  include scheduled prescription medications.    Hydromorphone, dihydrocodeine and norhydrocodone are expected    metabolites of hydrocodone . Hydromorphone and dihydrocodeine are    also available as scheduled prescription medications.  ==================================================================== Test                      Result    Flag   Units      Ref Range   Creatinine              106              mg/dL      >=95 ==================================================================== Declared Medications:  The flagging and interpretation on this report are based on the  following declared medications.  Unexpected results may arise from   inaccuracies in the declared medications.   **Note: The testing scope of this panel includes these medications:   Hydrocodone  (Norco)  Lorazepam (Ativan)   **Note: The testing scope of this panel does not include the  following reported medications:   Acetaminophen  (Norco)  Benazepril (Lotensin)  Biotin  Calcium  Cyclobenzaprine (Flexeril)  Docusate (Colace)  Ezetimibe (Zetia)  Hydrochlorothiazide  Ketoconazole  (Nizoral )  Loratadine  Multivitamin  Naproxen (Naprosyn)  Omeprazole (Prilosec)  Pregabalin (Lyrica)  Supplement  Tamsulosin (Flomax)  Topical  Topical Diclofenac (Voltaren)  Vitamin C  Zolpidem  (Ambien ) ==================================================================== For clinical consultation, please call [phone]. ====================================================================     No results  found for: "CBDTHCR" No results found for: "D8THCCBX" No results found for: "D9THCCBX"  ROS  Constitutional: Denies any fever or chills Gastrointestinal: No reported hemesis, hematochezia, vomiting, or acute GI distress Musculoskeletal: Bilateral lower back pain, right knee pain Neurological: No reported episodes of acute onset apraxia, aphasia, dysarthria, agnosia, amnesia, paralysis, loss of coordination, or loss of consciousness  Medication Review  Biotin, Calcium Polycarbophil, Echinacea, HYDROcodone -acetaminophen , LORazepam, Loratadine, Multi-Vitamins, NONFORMULARY OR COMPOUNDED ITEM, Vitamin C, benazepril-hydrochlorthiazide, cyclobenzaprine, diclofenac Sodium, docusate sodium, ezetimibe, ketoconazole , naproxen, omeprazole, pregabalin, tacrolimus , tamsulosin, triamcinolone cream, and zolpidem   History Review  Allergy: Perry Jones is allergic to other, gluten meal, pine tar, pistachio nut (diagnostic), pistachio nut extract, prednisone, and walnut. Drug: Perry Jones  reports no history of drug use. Alcohol:  reports current alcohol use. Tobacco:   reports that he quit smoking about 38 years ago. His smoking use included cigarettes. He started smoking about 56 years ago. He has a 36 pack-year smoking history. He has never used smokeless tobacco. Social: Perry Jones  reports that he quit smoking about 38 years ago. His smoking use included cigarettes. He started smoking about 56 years ago. He has a 36 pack-year smoking history. He has never used smokeless tobacco. He reports current alcohol use. He reports that he does not use drugs. Medical:  has a past medical history of Actinic keratosis, Dysplastic nevus (03/11/2020), Dysplastic nevus (03/12/2019), High cholesterol, Hypertension, and Sinus trouble. Surgical: Mr. Wojtaszek  has a past surgical history that includes Knee arthroscopy (Right); Colonoscopy with propofol  (N/A, 11/22/2017); Cholecystectomy; Lumbar fusion (N/A, 2000); Skin cancer excision; Replacement total knee (Right, 2007); Spinal cord stimulator insertion (N/A, 02/21/2021); Spinal cord stimulator implant (02/21/2021); Colonoscopy with propofol  (N/A, 04/23/2023); and polypectomy (04/23/2023). Family: family history includes Dementia in his mother; Heart Problems in his mother; Lung cancer in his father.  Laboratory Chemistry Profile   Renal Lab Results  Component Value Date   BUN 12 02/10/2021   CREATININE 0.72 02/10/2021   GFRAA >60 07/14/2018   GFRNONAA >60 02/10/2021    Hepatic Lab Results  Component Value Date   AST 45 (H) 09/28/2020   ALT 37 09/28/2020   ALBUMIN 4.5 09/28/2020   ALKPHOS 44 09/28/2020   LIPASE 28 07/14/2018    Electrolytes Lab Results  Component Value Date   NA 139 02/10/2021   K 3.5 02/10/2021   CL 105 02/10/2021   CALCIUM 9.6 02/10/2021    Bone Lab Results  Component Value Date   TESTOSTERONE  45 (L) 09/28/2020    Inflammation (CRP: Acute Phase) (ESR: Chronic Phase) No results found for: "CRP", "ESRSEDRATE", "LATICACIDVEN"       Note: Above Lab results reviewed.  Recent Imaging Review   MR SHOULDER RIGHT WO CONTRAST CLINICAL DATA:  Shoulder pain 5 years.  Decreased range of motion  EXAM: MRI OF THE RIGHT SHOULDER WITHOUT CONTRAST  TECHNIQUE: Multiplanar, multisequence MR imaging of the shoulder was performed. No intravenous contrast was administered.  COMPARISON:  None Available.  FINDINGS: Rotator cuff: Moderate supraspinatus tendinosis with a complete full-thickness, full width tear 2 cm from the peripheral insertion with 3.1 cm of retraction. Moderate infraspinatus tendinosis. Teres minor tendon is intact. Moderate subscapularis tendinosis.  Muscles: No muscle atrophy or edema. No intramuscular fluid collection or hematoma.  Biceps Long Head: Moderate tendinosis of the intra-articular portion of the long head of the biceps tendon.  Acromioclavicular Joint: Moderate arthropathy of the acromioclavicular joint. Trace subacromial/subdeltoid bursal fluid.  Glenohumeral Joint: Moderate partial-thickness cartilage loss of the glenohumeral  joint. Small joint effusion.  Labrum: Grossly intact, but evaluation is limited by lack of intraarticular fluid/contrast.  Bones: No fracture or dislocation. No aggressive osseous lesion.  Other: No fluid collection or hematoma.  IMPRESSION: 1. Moderate supraspinatus tendinosis with a complete full-thickness, full width tear 2 cm from the peripheral insertion with 3.1 cm of retraction. 2. Moderate infraspinatus and subscapularis tendinosis. 3. Moderate tendinosis of the intra-articular portion of the long head of the biceps tendon. 4. Moderate osteoarthritis of the glenohumeral joint.  Electronically Signed   By: Onnie Bilis M.D.   On: 09/07/2023 15:05 Note: Reviewed         Physical Exam  General appearance: Well nourished, well developed, and well hydrated. In no apparent acute distress Mental status: Alert, oriented x 3 (person, place, & time)       Respiratory: No evidence of acute respiratory  distress Eyes: PERLA Vitals: BP 131/85   Pulse 72   Temp 97.7 F (36.5 C)   Ht 6' (1.829 m)   Wt 235 lb (106.6 kg)   SpO2 99%   BMI 31.87 kg/m  BMI: Estimated body mass index is 31.87 kg/m as calculated from the following:   Height as of this encounter: 6' (1.829 m).   Weight as of this encounter: 235 lb (106.6 kg). Ideal: Ideal body weight: 77.6 kg (171 lb 1.2 oz) Adjusted ideal body weight: 89.2 kg (196 lb 10.3 oz)  Right knee pain, worse with weight bearing, walking, standing  Assessment   Diagnosis Status  1. 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 Chronic pain syndrome   3. History of total right knee replacement   4. Pharmacologic therapy   5. Chronic radicular lumbar pain   6. Chronic use of opiate for therapeutic purpose   7. Failed back surgical syndrome    Having a Flare-up Having a Flare-up Controlled   Updated Problems: No problems updated.  Plan of Care  Problem-specific:  Assessment and Plan We will continue on current medication regimen.  Prescribing drug monitoring (PDMP) reviewed; findings consistent with use of prescribed medication and no evidence of narcotic misuse or abuse.  Urine drug screening (UDS) up to date.  Schedule follow-up in 90 days for medication management.  Discussed repeating genicular RFA with Dr. Rhesa Celeste.    Perry Jones has a current medication list which includes the following long-term medication(s): benazepril-hydrochlorthiazide, loratadine, omeprazole, [START ON 12/23/2023] hydrocodone -acetaminophen , [START ON 01/22/2024] hydrocodone -acetaminophen , and [START ON 02/21/2024] hydrocodone -acetaminophen .  Pharmacotherapy (Medications Ordered): Meds ordered this encounter  Medications   HYDROcodone -acetaminophen  (NORCO) 10-325 MG tablet    Sig: Take 1 tablet by mouth every 6 (six) hours as needed for severe pain (pain score 7-10). Must last 30 days.    Dispense:  120 tablet    Refill:  0     Chronic Pain: STOP Act (Not applicable) Fill 1 day early if closed on refill date. Avoid benzodiazepines within 8 hours of opioids   HYDROcodone -acetaminophen  (NORCO) 10-325 MG tablet    Sig: Take 1 tablet by mouth every 6 (six) hours as needed for severe pain (pain score 7-10). Must last 30 days.    Dispense:  120 tablet    Refill:  0    Chronic Pain: STOP Act (Not applicable) Fill 1 day early if closed on refill date. Avoid benzodiazepines within 8 hours of opioids   HYDROcodone -acetaminophen  (NORCO) 10-325 MG tablet    Sig: Take 1 tablet by mouth every 6 (  six) hours as needed for severe pain (pain score 7-10). Must last 30 days.    Dispense:  120 tablet    Refill:  0    Chronic Pain: STOP Act (Not applicable) Fill 1 day early if closed on refill date. Avoid benzodiazepines within 8 hours of opioids   Orders:  Orders Placed This Encounter  Procedures   Radiofrequency,Genicular    Standing Status:   Future    Expiration Date:   03/05/2024    Scheduling Instructions:     Procedure: Genicular nerve(s) Radiofrequency Ablation (RFA)     Side(s): Right Knee     Level(s): Superior-Lateral, Superior-Medial, and Inferior-Medial Genicular Nerve(s)     Sedation: Patient's choice.     Scheduling Timeframe: As soon as pre-approved    Where will this procedure be performed?:   [facility]        Return in about 3 months (around 03/05/2024) for (F2F), (MM), Marthe Slain NP.    Recent Visits Date Type Provider Dept  09/06/23 Office Visit Cephus Collin, MD [facility]  Showing recent visits within past 90 days and meeting all other requirements Today's Visits Date Type Provider Dept  12/04/23 Office Visit Marra Fraga K, NP [facility]  Showing today's visits and meeting all other requirements Future Appointments Date Type Provider Dept  02/26/24 Appointment Mckenleigh Tarlton K, NP [facility]  Showing future appointments within next 90 days and meeting all  other requirements  I discussed the assessment and treatment plan with the patient. The patient was provided an opportunity to ask questions and all were answered. The patient agreed with the plan and demonstrated an understanding of the instructions.  Patient advised to call back or seek an in-person evaluation if the symptoms or condition worsens.  Duration of encounter: 30 minutes.  Total time on encounter, as per AMA guidelines included both the face-to-face and non-face-to-face time personally spent by the physician and/or other qualified health care professional(s) on the day of the encounter (includes time in activities that require the physician or other qualified health care professional and does not include time in activities normally performed by clinical staff). Physician's time may include the following activities when performed: Preparing to see the patient (e.g., pre-charting review of records, searching for previously ordered imaging, lab work, and nerve conduction tests) Review of prior analgesic pharmacotherapies. Reviewing PMP Interpreting ordered tests (e.g., lab work, imaging, nerve conduction tests) Performing post-procedure evaluations, including interpretation of diagnostic procedures Obtaining and/or reviewing separately obtained history Performing a medically appropriate examination and/or evaluation Counseling and educating the patient/family/caregiver Ordering medications, tests, or procedures Referring and communicating with other health care professionals (when not separately reported) Documenting clinical information in the electronic or other health record Independently interpreting results (not separately reported) and communicating results to the patient/ family/caregiver Care coordination (not separately reported)  Note by: Kaylene Dawn K Nehan Flaum, NP (TTS and AI technology used. I apologize for any typographical errors that were not detected and corrected.) Date:  12/04/2023; Time: 10:28 AM

## 2023-12-11 ENCOUNTER — Telehealth: Payer: Self-pay | Admitting: *Deleted

## 2023-12-11 ENCOUNTER — Telehealth: Payer: Self-pay | Admitting: Student in an Organized Health Care Education/Training Program

## 2023-12-11 NOTE — Telephone Encounter (Signed)
 PT called that he need to come in to see [employer] to get his pain adjusted. PT stated that he had spoke with [employer].

## 2023-12-11 NOTE — Telephone Encounter (Signed)
 Called patient and he has set up an appointment with [employer] rep to meet here on 01/02/2024 for reprogramming. Patient states he does not need to see Doctor at this visit. No appointment made.

## 2023-12-17 ENCOUNTER — Encounter: Payer: Self-pay | Admitting: Dermatology

## 2023-12-17 ENCOUNTER — Ambulatory Visit: Admitting: Dermatology

## 2023-12-17 DIAGNOSIS — Z79899 Other long term (current) drug therapy: Secondary | ICD-10-CM | POA: Diagnosis not present

## 2023-12-17 DIAGNOSIS — L209 Atopic dermatitis, unspecified: Secondary | ICD-10-CM | POA: Diagnosis not present

## 2023-12-17 DIAGNOSIS — D1801 Hemangioma of skin and subcutaneous tissue: Secondary | ICD-10-CM | POA: Diagnosis not present

## 2023-12-17 NOTE — Patient Instructions (Addendum)
 Continue Tacrolimus  ointment twice a day to active areas of eczema. If not continuing to be controlled by Tacrolimus  schedule appointment in our clinic to start Dupixent.     Gentle Skin Care Guide  1. Bathe no more than once a day.  2. Avoid bathing in hot water  3. Use a mild soap like Dove, Vanicream, Cetaphil, CeraVe. Can use Lever 2000 or Cetaphil antibacterial soap  4. Use soap only where you need it. On most days, use it under your arms, between your legs, and on your feet. Let the water rinse other areas unless visibly dirty.  5. When you get out of the bath/shower, use a towel to gently blot your skin dry, don't rub it.  6. While your skin is still a little damp, apply a moisturizing cream such as Vanicream, CeraVe, Cetaphil, Eucerin, Sarna lotion or plain Vaseline Jelly. For hands apply Neutrogena [place] Hand Cream or Excipial Hand Cream.  7. Reapply moisturizer any time you start to itch or feel dry.  8. Sometimes using free and clear laundry detergents can be helpful. Fabric softener sheets should be avoided. Downy Free & Gentle liquid, or any liquid fabric softener that is free of dyes and perfumes, it acceptable to use  9. If your doctor has given you prescription creams you may apply moisturizers over them        Due to recent changes in healthcare laws, you may see results of your pathology and/or laboratory studies on MyChart before the doctors have had a chance to review them. We understand that in some cases there may be results that are confusing or concerning to you. Please understand that not all results are received at the same time and often the doctors may need to interpret multiple results in order to provide you with the best plan of care or course of treatment. Therefore, we ask that you please give us  2 business days to thoroughly review all your results before contacting the office for clarification. Should we see a critical lab result, you will be  contacted sooner.   If You Need Anything After Your Visit  If you have any questions or concerns for your doctor, please call our main line at [phone] and press option 4 to reach your doctor's medical assistant. If no one answers, please leave a voicemail as directed and we will return your call as soon as possible. Messages left after 4 pm will be answered the following business day.   You may also send us  a message via MyChart. We typically respond to MyChart messages within 1-2 business days.  For prescription refills, please ask your pharmacy to contact our office. Our fax number is [phone].  If you have an urgent issue when the clinic is closed that cannot wait until the next business day, you can page your doctor at the number below.    Please note that while we do our best to be available for urgent issues outside of office hours, we are not available 24/7.   If you have an urgent issue and are unable to reach us , you may choose to seek medical care at your doctor's office, retail clinic, urgent care center, or emergency room.  If you have a medical emergency, please immediately call 911 or go to the emergency department.  Pager Numbers  - Dr. Hester: [phone]  - Dr. Jackquline: [phone]  - Dr. Claudene: [phone]   In the event of inclement weather, please call our main line at [phone]  for an update on the status of any delays or closures.  Dermatology Medication Tips: Please keep the boxes that topical medications come in in order to help keep track of the instructions about where and how to use these. Pharmacies typically print the medication instructions only on the boxes and not directly on the medication tubes.   If your medication is too expensive, please contact our office at [phone] option 4 or send us  a message through MyChart.   We are unable to tell what your co-pay for medications will be in advance as this is different depending on  your insurance coverage. However, we may be able to find a substitute medication at lower cost or fill out paperwork to get insurance to cover a needed medication.   If a prior authorization is required to get your medication covered by your insurance company, please allow us  1-2 business days to complete this process.  Drug prices often vary depending on where the prescription is filled and some pharmacies may offer cheaper prices.  The website www.goodrx.com contains coupons for medications through different pharmacies. The prices here do not account for what the cost may be with help from insurance (it may be cheaper with your insurance), but the website can give you the price if you did not use any insurance.  - You can print the associated coupon and take it with your prescription to the pharmacy.  - You may also stop by our office during regular business hours and pick up a GoodRx coupon card.  - If you need your prescription sent electronically to a different pharmacy, notify our office through [facility] or by phone at [phone] option 4.     Si Usted Necesita Algo Despus de Su Visita  Tambin puede enviarnos un mensaje a travs de Clinical cytogeneticist. Por lo general respondemos a los mensajes de MyChart en el transcurso de 1 a 2 das hbiles.  Para renovar recetas, por favor pida a su farmacia que se ponga en contacto con nuestra oficina. Randi lakes de fax es [place] [phone].  Si tiene un asunto urgente cuando la clnica est cerrada y que no puede esperar hasta el siguiente da hbil, puede llamar/localizar a su doctor(a) al nmero que aparece a continuacin.   Por favor, tenga en cuenta que aunque hacemos todo lo posible para estar disponibles para asuntos urgentes fuera del horario de [place], no estamos disponibles las 24 horas del da, los 7 [address]  [address] de la [place].   Si tiene un problema urgente y no puede comunicarse con nosotros, puede optar por buscar atencin mdica  en el  consultorio de su doctor(a), en una clnica privada, en un centro de atencin urgente o en una sala de emergencias.  Si tiene Engineer, drilling, por favor llame inmediatamente al 911 o vaya a la sala de emergencias.  Nmeros de bper  - Dr. Hester: [phone]  - Dra. Jackquline: [phone]  - Dr. Claudene: [phone]   En caso de inclemencias del tiempo, por favor llame a landry capes principal al [phone] para una actualizacin sobre el [place] de cualquier retraso o cierre.  Consejos para la medicacin en dermatologa: Por favor, guarde las cajas en las que vienen los medicamentos de uso tpico para ayudarle a seguir las instrucciones sobre dnde y cmo usarlos. Las farmacias generalmente imprimen las instrucciones del medicamento slo en las cajas y no directamente en los tubos del [place].   Si su medicamento es [employer], por favor, pngase en contacto con  nuestra oficina llamando al [phone] y presione la opcin 4 o envenos un mensaje a travs de Clinical cytogeneticist.   No podemos decirle cul ser su copago por los medicamentos por adelantado ya que esto es diferente dependiendo de la cobertura de su seguro. Sin embargo, es posible que podamos encontrar un medicamento sustituto a Audiological scientist un formulario para que el seguro cubra el medicamento que se considera necesario.   Si se requiere una autorizacin previa para que su compaa de seguros malta su medicamento, por favor permtanos de 1 a 2 das hbiles para completar este proceso.  Los precios de los medicamentos varan con frecuencia dependiendo del Environmental consultant de dnde se surte la receta y alguna farmacias pueden ofrecer precios ms baratos.  El sitio web www.goodrx.com tiene cupones para medicamentos de Health and safety inspector. Los precios aqu no tienen en cuenta lo que podra costar con la ayuda del seguro (puede ser ms barato con su seguro), pero el sitio web puede darle el precio si no utiliz Tourist information centre manager.  -  Puede imprimir el cupn correspondiente y llevarlo con su receta a la farmacia.  - Tambin puede pasar por nuestra oficina durante el horario de atencin regular y Education officer, museum una tarjeta de cupones de GoodRx.  - Si necesita que su receta se enve electrnicamente a una farmacia diferente, informe a nuestra oficina a travs de MyChart de [place] o por telfono llamando al [phone] y presione la opcin 4.

## 2023-12-17 NOTE — Progress Notes (Signed)
   Follow-Up Visit   Subjective  Perry Jones is a 73 y.o. male who presents for the following: Atopic Dermatitis. 1 month follow up. Hs been using Tacrolimus  ointment twice a day as directed. States skin is improving. Noticed a new area in scalp during hair cut. No longer itching.     The following portions of the chart were reviewed this encounter and updated as appropriate: medications, allergies, medical history  Review of Systems:  No other skin or systemic complaints except as noted in HPI or Assessment and Plan.  Objective  Well appearing patient in no apparent distress; mood and affect are within normal limits.  Areas Examined: Legs, arms, scalp  Relevant physical exam findings are noted in the Assessment and Plan.    Assessment & Plan   ATOPIC DERMATITIS, UNSPECIFIED TYPE   CHERRY ANGIOMA     ATOPIC DERMATITIS, improved with tacrolimus  ointment Exam: pink smooth patches at B/L lower legs. One scaly patch R lateral lower leg. <1% BSA improved from 5%  Chronic and persistent condition with duration or expected duration over one year. Condition is improving with treatment but not currently at goal.   Atopic dermatitis - Severe, on Dupixent (biologic medication).  Atopic dermatitis (eczema) is a chronic, relapsing, pruritic condition that can significantly affect quality of life. It is often associated with allergic rhinitis and/or asthma and can require treatment with topical medications, phototherapy, or in severe cases biologic medications, which require long term medication management.    Treatment Plan:  Continue Tacrolimus  ointment twice a day to active areas of eczema. If not continuing to be controlled by Tacrolimus  RTC to start Dupixent.   Long term medication management.  Patient is using long term (months to years) prescription medication  to control their dermatologic condition.  These medications require periodic monitoring to evaluate for efficacy  and side effects and may require periodic laboratory monitoring.   Recommend gentle skin care.  HEMANGIOMA Exam: red papule at left temporal scalp near hair line. Discussed benign nature. Recommend observation. Call for changes.    Return in about 1 year (around 12/16/2024) for Atopic Dermatitis Follow Up.  HARGIS Kate Fought, CMA, am acting as scribe for Boneta Sharps, MD.   Documentation: I have reviewed the above documentation for accuracy and completeness, and I agree with the above.  Boneta Sharps, MD

## 2023-12-21 ENCOUNTER — Encounter: Payer: Self-pay | Admitting: Student in an Organized Health Care Education/Training Program

## 2023-12-24 ENCOUNTER — Other Ambulatory Visit: Payer: Self-pay | Admitting: Nurse Practitioner

## 2023-12-31 ENCOUNTER — Encounter: Payer: Self-pay | Admitting: Student in an Organized Health Care Education/Training Program

## 2023-12-31 ENCOUNTER — Ambulatory Visit: Admitting: Student in an Organized Health Care Education/Training Program

## 2023-12-31 ENCOUNTER — Ambulatory Visit
Admission: RE | Admit: 2023-12-31 | Discharge: 2023-12-31 | Disposition: A | Discharge status: Home / Self Care | Source: Ambulatory Visit | Attending: Student in an Organized Health Care Education/Training Program | Admitting: Student in an Organized Health Care Education/Training Program

## 2023-12-31 VITALS — BP 172/100 | HR 62 | Temp 97.6°F | Resp 16 | Ht 72.0 in | Wt 235.0 lb

## 2023-12-31 DIAGNOSIS — G8929 Other chronic pain: Secondary | ICD-10-CM | POA: Insufficient documentation

## 2023-12-31 DIAGNOSIS — Z96651 Presence of right artificial knee joint: Secondary | ICD-10-CM

## 2023-12-31 DIAGNOSIS — M25561 Pain in right knee: Secondary | ICD-10-CM

## 2023-12-31 DIAGNOSIS — M25571 Pain in right ankle and joints of right foot: Secondary | ICD-10-CM | POA: Insufficient documentation

## 2023-12-31 DIAGNOSIS — M25511 Pain in right shoulder: Secondary | ICD-10-CM | POA: Insufficient documentation

## 2023-12-31 DIAGNOSIS — M792 Neuralgia and neuritis, unspecified: Secondary | ICD-10-CM | POA: Insufficient documentation

## 2023-12-31 MED ORDER — ROPIVACAINE HCL 2 MG/ML IJ SOLN
9.0000 mL | Freq: Once | INTRAMUSCULAR | Status: AC
  Administered 2023-12-31: 9 mL via PERINEURAL
  Filled 2023-12-31: qty 20

## 2023-12-31 MED ORDER — ROPIVACAINE HCL 2 MG/ML IJ SOLN
INTRAMUSCULAR | Status: AC
Start: 2023-12-31 — End: 2023-12-31
  Filled 2023-12-31: qty 20

## 2023-12-31 MED ORDER — LIDOCAINE HCL 2 % IJ SOLN
20.0000 mL | Freq: Once | INTRAMUSCULAR | Status: AC
  Administered 2023-12-31: 400 mg
  Filled 2023-12-31: qty 20

## 2023-12-31 MED ORDER — DEXAMETHASONE SODIUM PHOSPHATE 10 MG/ML IJ SOLN
10.0000 mg | Freq: Once | INTRAMUSCULAR | Status: AC
  Administered 2023-12-31: 10 mg
  Filled 2023-12-31: qty 1

## 2023-12-31 NOTE — Progress Notes (Signed)
 Safety precautions to be maintained throughout the outpatient stay will include: orient to surroundings, keep bed in low position, maintain call bell within reach at all times, provide assistance with transfer out of bed and ambulation.

## 2023-12-31 NOTE — Patient Instructions (Signed)

## 2023-12-31 NOTE — Progress Notes (Signed)
 PROVIDER NOTE: Interpretation of information contained herein should be left to medically-trained personnel. Specific patient instructions are provided elsewhere under Patient Instructions section of medical record. This document was created in part using STT-dictation technology, any transcriptional errors that may result from this process are unintentional.  Patient: Perry Jones Type: Established DOB: 1951/05/09 MRN: [id number] PCP: Glover Lenis, MD  Service: Procedure DOS: 12/31/2023 Setting: Ambulatory Location: Ambulatory outpatient facility Delivery: Face-to-face Provider: Wallie Sherry, MD Specialty: Interventional Pain Management Specialty designation: 09 Location: Outpatient facility Ref. Prov.: Patel, Seema K, NP       Interventional Therapy   Primary Reason for Visit: Interventional Pain Management Treatment. CC: Knee Pain (right), Ankle Pain (right), and Shoulder Pain (right)    Procedure:          Anesthesia, Analgesia, Anxiolysis:  Type: Therapeutic Superolateral, Superomedial, and Inferomedial, Genicular Nerve Radiofrequency Ablation (destruction).   Region: Lateral, Anterior, and Medial aspects of the knee joint, above and below the knee joint proper. Level: Superior and inferior to the knee joint. Laterality: Right  Anesthesia: Local (1-2% Lidocaine )  Anxiolysis: None  Sedation: None  Guidance: Fluoroscopy           Position: Supine   Indications: 1. 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 History of total right knee replacement   3. Neuropathic pain of right ankle    Perry Jones has been dealing with the above chronic pain for longer than three months and has either failed to respond, was unable to tolerate, or simply did not get enough benefit from other more conservative therapies including, but not limited to: 1. Over-the-counter medications 2. Anti-inflammatory medications 3. Muscle relaxants 4. Membrane stabilizers 5.  Opioids 6. Physical therapy and/or chiropractic manipulation 7. Modalities (Heat, ice, etc.) 8. Invasive techniques such as nerve blocks. Perry Jones has attained more than 50% relief of the pain from a series of diagnostic injections conducted in separate occasions.  Pain Score: Pre-procedure: 6 /10 Post-procedure: 6 /10    Pre-op H&P Assessment:  Perry Jones is a 73 y.o. (year old), male patient, seen today for interventional treatment. He  has a past surgical history that includes Knee arthroscopy (Right); Colonoscopy with propofol  (N/A, 11/22/2017); Cholecystectomy; Lumbar fusion (N/A, 2000); Skin cancer excision; Replacement total knee (Right, 2007); Spinal cord stimulator insertion (N/A, 02/21/2021); Spinal cord stimulator implant (02/21/2021); Colonoscopy with propofol  (N/A, 04/23/2023); and polypectomy (04/23/2023). Perry Jones has a current medication list which includes the following prescription(s): vitamin c, benazepril-hydrochlorthiazide, biotin, calcium polycarbophil, cyclobenzaprine, diclofenac sodium, docusate sodium, ezetimibe, hydrocodone -acetaminophen , [START ON 01/22/2024] hydrocodone -acetaminophen , [START ON 02/21/2024] hydrocodone -acetaminophen , loratadine, lorazepam, omeprazole, pregabalin, tacrolimus , tamsulosin, zolpidem , echinacea, ketoconazole , multi-vitamins, naproxen, NONFORMULARY OR COMPOUNDED ITEM, and triamcinolone cream. His primarily concern today is the Knee Pain (right), Ankle Pain (right), and Shoulder Pain (right)  Initial Vital Signs:  Pulse/HCG Rate: 62ECG Heart Rate: 61 Temp: 97.6 F (36.4 C) Resp: 16 BP: (!) 158/92 SpO2: 100 %  BMI: Estimated body mass index is 31.87 kg/m as calculated from the following:   Height as of this encounter: 6' (1.829 m).   Weight as of this encounter: 235 lb (106.6 kg).  Risk Assessment: Allergies: Reviewed. He is allergic to other, gluten meal, pine tar, pistachio nut (diagnostic), pistachio nut extract, prednisone, and  walnut.  Allergy Precautions: None required Coagulopathies: Reviewed. None identified.  Blood-thinner therapy: None at this time Active Infection(s): Reviewed. None identified. Perry Jones is afebrile  Site Confirmation: Perry Jones was asked to confirm  the procedure and laterality before marking the site Procedure checklist: Completed Consent: Before the procedure and under the influence of no sedative(s), amnesic(s), or anxiolytics, the patient was informed of the treatment options, risks and possible complications. To fulfill our ethical and legal obligations, as recommended by the American Medical Association's Code of Ethics, I have informed the patient of my clinical impression; the nature and purpose of the treatment or procedure; the risks, benefits, and possible complications of the intervention; the alternatives, including doing nothing; the risk(s) and benefit(s) of the alternative treatment(s) or procedure(s); and the risk(s) and benefit(s) of doing nothing. The patient was provided information about the general risks and possible complications associated with the procedure. These may include, but are not limited to: failure to achieve desired goals, infection, bleeding, organ or nerve damage, allergic reactions, paralysis, and death. In addition, the patient was informed of those risks and complications associated to the procedure, such as failure to decrease pain; infection; bleeding; organ or nerve damage with subsequent damage to sensory, motor, and/or autonomic systems, resulting in permanent pain, numbness, and/or weakness of one or several areas of the body; allergic reactions; (i.e.: anaphylactic reaction); and/or death. Furthermore, the patient was informed of those risks and complications associated with the medications. These include, but are not limited to: allergic reactions (i.e.: anaphylactic or anaphylactoid reaction(s)); adrenal axis suppression; blood sugar elevation that in  diabetics may result in ketoacidosis or comma; water retention that in patients with history of congestive heart failure may result in shortness of breath, pulmonary edema, and decompensation with resultant heart failure; weight gain; swelling or edema; medication-induced neural toxicity; particulate matter embolism and blood vessel occlusion with resultant organ, and/or nervous system infarction; and/or aseptic necrosis of one or more joints. Finally, the patient was informed that Medicine is not an exact science; therefore, there is also the possibility of unforeseen or unpredictable risks and/or possible complications that may result in a catastrophic outcome. The patient indicated having understood very clearly. We have given the patient no guarantees and we have made no promises. Enough time was given to the patient to ask questions, all of which were answered to the patient's satisfaction. Mr. Fick has indicated that he wanted to continue with the procedure. Attestation: I, the ordering provider, attest that I have discussed with the patient the benefits, risks, side-effects, alternatives, likelihood of achieving goals, and potential problems during recovery for the procedure that I have provided informed consent. Date  Time: 12/31/2023 10:16 AM  Pre-Procedure Preparation:  Monitoring: As per clinic protocol. Respiration, ETCO2, SpO2, BP, heart rate and rhythm monitor placed and checked for adequate function Safety Precautions: Patient was assessed for positional comfort and pressure points before starting the procedure. Time-out: I initiated and conducted the Time-out before starting the procedure, as per protocol. The patient was asked to participate by confirming the accuracy of the Time Out information. Verification of the correct person, site, and procedure were performed and confirmed by me, the nursing staff, and the patient. Time-out conducted as per Joint Commission's Universal Protocol  (UP.01.01.01). Time: 1135 Start Time: 1135 hrs.  Description of Procedure:          Target Area: For Genicular Nerve radiofrequency ablation (destruction), the targets are: the superolateral genicular nerve, located in the lateral distal portion of the femoral shaft as it curves to form the lateral epicondyle, in the region of the distal femoral metaphysis; the superomedial genicular nerve, located in the medial distal portion of the femoral shaft as  it curves to form the medial epicondyle; and the inferomedial genicular nerve, located in the medial, proximal portion of the tibial shaft, as it curves to form the medial epicondyle, in the region of the proximal tibial metaphysis. Approach: Anterior, ipsilateral approach. Area Prepped: Entire knee area, from mid-thigh to mid-shin, lateral, anterior, and medial aspects. DuraPrep (Iodine Povacrylex [0.7% available iodine] and Isopropyl Alcohol, 74% w/w) Safety Precautions: Aspiration looking for blood return was conducted prior to all injections. At no point did we inject any substances, as a needle was being advanced. No attempts were made at seeking any paresthesias. Safe injection practices and needle disposal techniques used. Medications properly checked for expiration dates. SDV (single dose vial) medications used. Description of the Procedure: Protocol guidelines were followed. The patient was placed in position over the procedure table. The target area was identified and the area prepped in the usual manner. The skin and muscle were infiltrated with local anesthetic. Appropriate amount of time allowed to pass for local anesthetics to take effect. Radiofrequency needles were introduced to the target area using fluoroscopic guidance. Using the NeuroTherm NT1100 Radiofrequency Generator, sensory stimulation using 50 Hz was used to locate & identify the nerve, making sure that the needle was positioned such that there was no sensory stimulation below 0.3 V  or above 0.7 V. Stimulation using 2 Hz was used to evaluate the motor component. Care was taken not to lesion any nerves that demonstrated motor stimulation of the lower extremities at an output of less than 2.5 times that of the sensory threshold, or a maximum of 2.0 V. Once satisfactory placement of the needles was achieved, the numbing solution was slowly injected after negative aspiration. After waiting for at least 2 minutes, the ablation was performed at 80 degrees C for 60 seconds, using regular Radiofrequency settings. Once the procedure was completed, the needles were then removed and the area cleansed, making sure to leave some of the prepping solution back to take advantage of its long term bactericidal properties. Intra-operative Compliance: Compliant      Vitals:   12/31/23 1031 12/31/23 1132 12/31/23 1142 12/31/23 1151  BP: (!) 158/92 (!) 141/96 (!) 172/100   Pulse: 62     Resp: 16 20 16 16   Temp: 97.6 F (36.4 C)     SpO2: 100% 97% 99% 100%  Weight: 235 lb (106.6 kg)     Height: 6' (1.829 m)       Start Time: 1135 hrs. End Time: 1149 hrs. Materials & Medications:  Needle(s) Type: Teflon-coated, curved tip, Radiofrequency needle(s) Gauge: 22G Length: 10cm Medication(s): Please see orders for medications and dosing details. 6 cc solution made of 5 cc of 0.2% ropivacaine , 1 cc of Decadron  10 mg/cc.  2 cc injected at each level above for the right genicular nerves after sensorimotor testing, prior to lesioning. Imaging Guidance (Non-Spinal):          Type of Imaging Technique: Fluoroscopy Guidance (Non-Spinal) Indication(s): Assistance in needle guidance and placement for procedures requiring needle placement in or near specific anatomical locations not easily accessible without such assistance. Exposure Time: Please see nurses notes. Contrast: None used. Fluoroscopic Guidance: I was personally present during the use of fluoroscopy. Tunnel Vision Technique used to obtain  the best possible view of the target area. Parallax error corrected before commencing the procedure. Direction-depth-direction technique used to introduce the needle under continuous pulsed fluoroscopy. Once target was reached, antero-posterior, oblique, and lateral fluoroscopic projection used confirm needle placement in all planes.  Images permanently stored in EMR. Interpretation: No contrast injected.  Antibiotic Prophylaxis:   Anti-infectives (From admission, onward)    None      Indication(s): None identified  Post-operative Assessment:  Post-procedure Vital Signs:  Pulse/HCG Rate: 6265 Temp: 97.6 F (36.4 C) Resp: 16 BP: (!) 172/100 SpO2: 100 %  EBL: None  Complications: No immediate post-treatment complications observed by team, or reported by patient.  Note: The patient tolerated the entire procedure well. A repeat set of vitals were taken after the procedure and the patient was kept under observation following institutional policy, for this type of procedure. Post-procedural neurological assessment was performed, showing return to baseline, prior to discharge. The patient was provided with post-procedure discharge instructions, including a section on how to identify potential problems. Should any problems arise concerning this procedure, the patient was given instructions to immediately contact us , at any time, without hesitation. In any case, we plan to contact the patient by telephone for a follow-up status report regarding this interventional procedure.  Comments:  No additional relevant information.  Plan of Care (POC)  Patient is endorsing increased neuropathic pain of his right ankle.  He states that his spinal cord stimulator does not provide relief in that region.  We discussed a right ankle block blocking the posterior tibial, deep peroneal, common peroneal, sural and saphenous nerves at the level of the ankle. Orders:  Orders Placed This Encounter  Procedures    DG PAIN [facility] C-ARM 1-60 MIN NO REPORT    Intraoperative interpretation by procedural physician at [facility].    Standing Status:   Standing    Number of Occurrences:   1    Reason for exam::   Assistance in needle guidance and placement for procedures requiring needle placement in or near specific anatomical locations not easily accessible without such assistance.   PR INJECTION AA&/STRD OTHER PERIPHERAL NERVE/BRANCH    Right ankle block 35349K4 Blockade of the common peroneal, deep peroneal, posterior tibial, sural, saphenous nerve block at the ankle with local anesthetic only     Medications ordered for procedure: Meds ordered this encounter  Medications   lidocaine  (XYLOCAINE ) 2 % (with pres) injection 400 mg   ropivacaine  (PF) 2 mg/mL (0.2%) (NAROPIN ) injection 9 mL   dexamethasone  (DECADRON ) injection 10 mg   Medications administered: We administered lidocaine , ropivacaine  (PF) 2 mg/mL (0.2%), and dexamethasone .  See the medical record for exact dosing, route, and time of administration.  Follow-up plan:   Return in about 4 weeks (around 01/28/2024) for RIGHT ankle block with LA only.      Recent Visits Date Type Provider Dept  12/04/23 Office Visit Patel, Seema K, NP [facility]  Showing recent visits within past 90 days and meeting all other requirements Today's Visits Date Type Provider Dept  12/31/23 Procedure visit Marcelino Nurse, MD [facility]  Showing today's visits and meeting all other requirements Future Appointments Date Type Provider Dept  01/28/24 Appointment Marcelino Nurse, MD [facility]  02/26/24 Appointment Patel, Seema K, NP [facility]  Showing future appointments within next 90 days and meeting all other requirements  Disposition: Discharge home  Discharge (Date  Time): 12/31/2023; 1152 hrs.   Primary Care Physician: Glover Lenis, MD Location: [facility] Note by:  Nurse Marcelino, MD (TTS technology used. I apologize for any typographical errors that were not detected and corrected.) Date: 12/31/2023; Time: 12:03 PM  Disclaimer:  Medicine is not an Visual merchandiser. The  only guarantee in medicine is that nothing is guaranteed. It is important to note that the decision to proceed with this intervention was based on the information collected from the patient. The Data and conclusions were drawn from the patient's questionnaire, the interview, and the physical examination. Because the information was provided in large part by the patient, it cannot be guaranteed that it has not been purposely or unconsciously manipulated. Every effort has been made to obtain as much relevant data as possible for this evaluation. It is important to note that the conclusions that lead to this procedure are derived in large part from the available data. Always take into account that the treatment will also be dependent on availability of resources and existing treatment guidelines, considered by other Pain Management Practitioners as being common knowledge and practice, at the time of the intervention. For Medico-Legal purposes, it is also important to point out that variation in procedural techniques and pharmacological choices are the acceptable norm. The indications, contraindications, technique, and results of the above procedure should only be interpreted and judged by a Board-Certified Interventional Pain Specialist with extensive familiarity and expertise in the same exact procedure and technique.

## 2024-01-15 NOTE — Telephone Encounter (Signed)
 [name]

## 2024-01-28 ENCOUNTER — Encounter: Payer: Self-pay | Admitting: Student in an Organized Health Care Education/Training Program

## 2024-01-28 ENCOUNTER — Ambulatory Visit
Attending: Student in an Organized Health Care Education/Training Program | Admitting: Student in an Organized Health Care Education/Training Program

## 2024-01-28 VITALS — BP 139/89 | HR 60 | Temp 97.5°F | Resp 16 | Ht 72.0 in | Wt 230.0 lb

## 2024-01-28 DIAGNOSIS — Z96651 Presence of right artificial knee joint: Secondary | ICD-10-CM | POA: Diagnosis not present

## 2024-01-28 DIAGNOSIS — G6289 Other specified polyneuropathies: Secondary | ICD-10-CM | POA: Insufficient documentation

## 2024-01-28 DIAGNOSIS — M792 Neuralgia and neuritis, unspecified: Secondary | ICD-10-CM

## 2024-01-28 DIAGNOSIS — G894 Chronic pain syndrome: Secondary | ICD-10-CM | POA: Insufficient documentation

## 2024-01-28 DIAGNOSIS — G629 Polyneuropathy, unspecified: Secondary | ICD-10-CM | POA: Insufficient documentation

## 2024-01-28 DIAGNOSIS — M25571 Pain in right ankle and joints of right foot: Secondary | ICD-10-CM | POA: Insufficient documentation

## 2024-01-28 DIAGNOSIS — Z85828 Personal history of other malignant neoplasm of skin: Secondary | ICD-10-CM | POA: Diagnosis not present

## 2024-01-28 DIAGNOSIS — G5701 Lesion of sciatic nerve, right lower limb: Secondary | ICD-10-CM | POA: Diagnosis not present

## 2024-01-28 MED ORDER — LIDOCAINE HCL 2 % IJ SOLN
INTRAMUSCULAR | Status: AC
  Filled 2024-01-28: qty 20

## 2024-01-28 MED ORDER — LIDOCAINE HCL 2 % IJ SOLN
20.0000 mL | Freq: Once | INTRAMUSCULAR | Status: AC
  Administered 2024-01-28: 400 mg

## 2024-01-28 MED ORDER — DEXAMETHASONE SODIUM PHOSPHATE 10 MG/ML IJ SOLN: 10.0000 mg | Freq: Once | INTRAMUSCULAR | Status: DC

## 2024-01-28 MED ORDER — ROPIVACAINE HCL 2 MG/ML IJ SOLN
INTRAMUSCULAR | Status: AC
  Filled 2024-01-28: qty 20

## 2024-01-28 MED ORDER — ROPIVACAINE HCL 2 MG/ML IJ SOLN
9.0000 mL | Freq: Once | INTRAMUSCULAR | Status: AC
  Administered 2024-01-28: 20 mL via PERINEURAL

## 2024-01-28 NOTE — Progress Notes (Signed)
 PROVIDER NOTE: Interpretation of information contained herein should be left to medically-trained personnel. Specific patient instructions are provided elsewhere under Patient Instructions section of medical record. This document was created in part using STT-dictation technology, any transcriptional errors that may result from this process are unintentional.  Patient: Perry Jones Type: Established DOB: 04/25/1951 MRN: [id number] PCP: Glover Lenis, MD  Service: Procedure DOS: 01/28/2024 Setting: Ambulatory Location: Ambulatory outpatient facility Delivery: Face-to-face Provider: Wallie Sherry, MD Specialty: Interventional Pain Management Specialty designation: 09 Location: Outpatient facility Ref. Prov.: Glover Lenis, MD       Interventional Therapy     Procedure:          Anesthesia, Analgesia, Anxiolysis:  Type: Diagnostic True Ankle Block of the right saphenous, deep peroneal and superficial peroneal nerve with ultrasound guidance                Region: Dorsal             Level: Ankle Laterality: Right  Type: Local Anesthesia Local Anesthetic: Lidocaine  1-2% Sedation: None  Indication(s):  Analgesia Route: Infiltration (JAARS/IM) IV Access: N/A   Position: Supine   1. Neuropathic pain of right ankle    NAS-11 Pain score:   Pre-procedure: 5 /10   Post-procedure: 0-No pain/10     H&P (Pre-op Assessment):  Perry Jones is a 73 y.o. (year old), male patient, seen today for interventional treatment. He  has a past surgical history that includes Knee arthroscopy (Right); Colonoscopy with propofol  (N/A, 11/22/2017); Cholecystectomy; Lumbar fusion (N/A, 2000); Skin cancer excision; Replacement total knee (Right, 2007); Spinal cord stimulator insertion (N/A, 02/21/2021); Spinal cord stimulator implant (02/21/2021); Colonoscopy with propofol  (N/A, 04/23/2023); and polypectomy (04/23/2023). [name] has a current medication list which includes the following prescription(s):  vitamin c, benazepril-hydrochlorthiazide, biotin, calcium polycarbophil, cyclobenzaprine, diclofenac sodium, docusate sodium, ezetimibe, gabapentin, hydrocodone -acetaminophen , hydrocodone -acetaminophen , [START ON 02/21/2024] hydrocodone -acetaminophen , loratadine, lorazepam, multi-vitamins, omeprazole, pregabalin, tamsulosin, zolpidem , echinacea, ketoconazole , naproxen, NONFORMULARY OR COMPOUNDED ITEM, tacrolimus , and triamcinolone cream, and the following Facility-Administered Medications: dexamethasone . His primarily concern today is the Ankle Pain (right)  Initial Vital Signs:  Pulse/HCG Rate: 68  Temp: (!) 97.5 F (36.4 C) Resp: 16 BP: 110/76 SpO2: 96 %  BMI: Estimated body mass index is 31.19 kg/m as calculated from the following:   Height as of this encounter: 6' (1.829 m).   Weight as of this encounter: 230 lb (104.3 kg).  Risk Assessment: Allergies: Reviewed. He is allergic to other, gluten meal, pine tar, pistachio nut (diagnostic), pistachio nut extract, prednisone, and walnut.  Allergy Precautions: None required Coagulopathies: Reviewed. None identified.  Blood-thinner therapy: None at this time Active Infection(s): Reviewed. None identified. [name] is afebrile  Site Confirmation: [name] was asked to confirm the procedure and laterality before marking the site Procedure checklist: Completed Consent: Before the procedure and under the influence of no sedative(s), amnesic(s), or anxiolytics, the patient was informed of the treatment options, risks and possible complications. To fulfill our ethical and legal obligations, as recommended by the American Medical Association's Code of Ethics, I have informed the patient of my clinical impression; the nature and purpose of the treatment or procedure; the risks, benefits, and possible complications of the intervention; the alternatives, including doing nothing; the risk(s) and benefit(s) of the alternative treatment(s) or procedure(s);  and the risk(s) and benefit(s) of doing nothing. The patient was provided information about the general risks and possible complications associated with the procedure. These may include, but are not limited to: failure to  achieve desired goals, infection, bleeding, organ or nerve damage, allergic reactions, paralysis, and death. In addition, the patient was informed of those risks and complications associated to the procedure, such as failure to decrease pain; infection; bleeding; organ or nerve damage with subsequent damage to sensory, motor, and/or autonomic systems, resulting in permanent pain, numbness, and/or weakness of one or several areas of the body; allergic reactions; (i.e.: anaphylactic reaction); and/or death. Furthermore, the patient was informed of those risks and complications associated with the medications. These include, but are not limited to: allergic reactions (i.e.: anaphylactic or anaphylactoid reaction(s)); adrenal axis suppression; blood sugar elevation that in diabetics may result in ketoacidosis or comma; water retention that in patients with history of congestive heart failure may result in shortness of breath, pulmonary edema, and decompensation with resultant heart failure; weight gain; swelling or edema; medication-induced neural toxicity; particulate matter embolism and blood vessel occlusion with resultant organ, and/or nervous system infarction; and/or aseptic necrosis of one or more joints. Finally, the patient was informed that Medicine is not an exact science; therefore, there is also the possibility of unforeseen or unpredictable risks and/or possible complications that may result in a catastrophic outcome. The patient indicated having understood very clearly. We have given the patient no guarantees and we have made no promises. Enough time was given to the patient to ask questions, all of which were answered to the patient's satisfaction. [name] has indicated that he  wanted to continue with the procedure. Attestation: I, the ordering provider, attest that I have discussed with the patient the benefits, risks, side-effects, alternatives, likelihood of achieving goals, and potential problems during recovery for the procedure that I have provided informed consent. Date  Time: 01/28/2024  9:50 AM  Pre-Procedure Preparation:  Monitoring: As per clinic protocol. Respiration, ETCO2, SpO2, BP, heart rate and rhythm monitor placed and checked for adequate function Safety Precautions: Patient was assessed for positional comfort and pressure points before starting the procedure. Time-out: I initiated and conducted the Time-out before starting the procedure, as per protocol. The patient was asked to participate by confirming the accuracy of the Time Out information. Verification of the correct person, site, and procedure were performed and confirmed by me, the nursing staff, and the patient. Time-out conducted as per Joint Commission's Universal Protocol (UP.01.01.01). Time: 1044 Start Time: 1044 hrs.  Description of Procedure:          Approach: Dorsal approach. Area Prepped: Entire dorsal foot Region ChloraPrep (2% chlorhexidine  gluconate and 70% isopropyl alcohol) Safety Precautions: Aspiration looking for blood return was conducted prior to all injections. At no point did we inject any substances, as a needle was being advanced. No attempts were made at seeking any paresthesias. Safe injection practices and needle disposal techniques used. Medications properly checked for expiration dates. SDV (single dose vial) medications used. Description of the Procedure: Protocol guidelines were followed. The patient was placed in position. The target area was identified and the area prepped in the usual manner. Skin & deeper tissues infiltrated with local anesthetic. Appropriate amount of time allowed to pass for local anesthetics to take effect. The procedure needles were  then advanced to the target area. Proper needle placement secured. Negative aspiration confirmed. Solution injected in intermittent fashion, asking for systemic symptoms every 0.5cc of injectate. The needles were then removed and the area cleansed, making sure to leave some of the prepping solution back to take advantage of its long term bactericidal properties.  Vitals:   01/28/24 1016 01/28/24 1050 01/28/24 1052  BP: 110/76 132/75 139/89  Pulse: 68 60 60  Resp: 16 16 16   Temp: (!) 97.5 F (36.4 C)    SpO2: 96% 99% 100%  Weight: 230 lb (104.3 kg)    Height: 6' (1.829 m)      Start Time: 1044 hrs. End Time: 1051 hrs.  Materials:  Needle(s) Type: Regular needle Gauge: 25G Length: 1.5-in Medication(s): Please see orders for medications and dosing details. 9 cc solution made of 8 cc of 0.2% ropivacaine , 1 cc of Decadron  10 mg/cc.  3 cc injected for the saphenous nerve at the ankle, 3 cc injected for the superficial peroneal nerve at the ankle, 3 cc injected for the deep peroneal nerve at the ankle  Type of Imaging Technique: Ultrasound Guidance Indication(s): Assistance in needle guidance  Antibiotic Prophylaxis:   Anti-infectives (From admission, onward)    None      Indication(s): None identified  Post-operative Assessment:  Post-procedure Vital Signs:  Pulse/HCG Rate: 60  Temp: (!) 97.5 F (36.4 C) Resp: 16 BP: 139/89 SpO2: 100 %  EBL: None  Complications: No immediate post-treatment complications observed by team, or reported by patient.  Note: The patient tolerated the entire procedure well. A repeat set of vitals were taken after the procedure and the patient was kept under observation following institutional policy, for this type of procedure. Post-procedural neurological assessment was performed, showing return to baseline, prior to discharge. The patient was provided with post-procedure discharge  instructions, including a section on how to identify potential problems. Should any problems arise concerning this procedure, the patient was given instructions to immediately contact us , at any time, without hesitation. In any case, we plan to contact the patient by telephone for a follow-up status report regarding this interventional procedure.  Comments:  No additional relevant information.  Plan of Care (POC)  Orders:  No orders of the defined types were placed in this encounter.   hydrocodone  10 mg every 6 hours as needed prn    Medications ordered for procedure: Meds ordered this encounter  Medications   lidocaine  (XYLOCAINE ) 2 % (with pres) injection 400 mg   ropivacaine  (PF) 2 mg/mL (0.2%) (NAROPIN ) injection 9 mL   dexamethasone  (DECADRON ) injection 10 mg   Medications administered: We administered lidocaine  and ropivacaine  (PF) 2 mg/mL (0.2%).  See the medical record for exact dosing, route, and time of administration.    Follow-up plan:   Return for Keep sch. appt.     Recent Visits Date Type Provider Dept  12/31/23 Procedure visit Marcelino Nurse, MD [facility]  12/04/23 Office Visit Patel, Seema K, NP [facility]  Showing recent visits within past 90 days and meeting all other requirements Today's Visits Date Type Provider Dept  01/28/24 Procedure visit Marcelino Nurse, MD [facility]  Showing today's visits and meeting all other requirements Future Appointments Date Type Provider Dept  02/26/24 Appointment Patel, Seema K, NP [facility]  Showing future appointments within next 90 days and meeting all other requirements   Disposition: Discharge home  Discharge (Date  Time): 01/28/2024; 1100 hrs.   Primary Care Physician: Glover Lenis, MD Location: [facility] Note by: Nurse Marcelino, MD (TTS technology used. I apologize for any typographical errors that were not detected and  corrected.) Date: 01/28/2024; Time: 11:17 AM  Disclaimer:  Medicine is not an Visual merchandiser. The only guarantee in medicine is that nothing is guaranteed. It  is important to note that the decision to proceed with this intervention was based on the information collected from the patient. The Data and conclusions were drawn from the patient's questionnaire, the interview, and the physical examination. Because the information was provided in large part by the patient, it cannot be guaranteed that it has not been purposely or unconsciously manipulated. Every effort has been made to obtain as much relevant data as possible for this evaluation. It is important to note that the conclusions that lead to this procedure are derived in large part from the available data. Always take into account that the treatment will also be dependent on availability of resources and existing treatment guidelines, considered by other Pain Management Practitioners as being common knowledge and practice, at the time of the intervention. For Medico-Legal purposes, it is also important to point out that variation in procedural techniques and pharmacological choices are the acceptable norm. The indications, contraindications, technique, and results of the above procedure should only be interpreted and judged by a Board-Certified Interventional Pain Specialist with extensive familiarity and expertise in the same exact procedure and technique.

## 2024-01-28 NOTE — Patient Instructions (Signed)

## 2024-01-28 NOTE — Progress Notes (Signed)
 Safety precautions to be maintained throughout the outpatient stay will include: orient to surroundings, keep bed in low position, maintain call bell within reach at all times, provide assistance with transfer out of bed and ambulation.   Pt states since last visit had episode of left ear actively bleeding for 3 days - seen at [facility]. Issue resolved on its own with no intervention.

## 2024-01-29 ENCOUNTER — Telehealth: Payer: Self-pay | Admitting: *Deleted

## 2024-01-29 NOTE — Telephone Encounter (Signed)
Post procedure call:   no  questions or concerns.  

## 2024-02-26 ENCOUNTER — Ambulatory Visit: Attending: Nurse Practitioner | Admitting: Nurse Practitioner

## 2024-02-26 ENCOUNTER — Encounter: Payer: Self-pay | Admitting: Nurse Practitioner

## 2024-02-26 VITALS — BP 174/85 | HR 67 | Temp 97.2°F | Resp 20 | Ht 72.0 in | Wt 233.0 lb

## 2024-02-26 DIAGNOSIS — G894 Chronic pain syndrome: Secondary | ICD-10-CM | POA: Insufficient documentation

## 2024-02-26 DIAGNOSIS — M25561 Pain in right knee: Secondary | ICD-10-CM | POA: Insufficient documentation

## 2024-02-26 DIAGNOSIS — M961 Postlaminectomy syndrome, not elsewhere classified: Secondary | ICD-10-CM | POA: Insufficient documentation

## 2024-02-26 DIAGNOSIS — Z79891 Long term (current) use of opiate analgesic: Secondary | ICD-10-CM | POA: Diagnosis present

## 2024-02-26 DIAGNOSIS — M5416 Radiculopathy, lumbar region: Secondary | ICD-10-CM | POA: Insufficient documentation

## 2024-02-26 DIAGNOSIS — Z96651 Presence of right artificial knee joint: Secondary | ICD-10-CM | POA: Diagnosis present

## 2024-02-26 DIAGNOSIS — Z79899 Other long term (current) drug therapy: Secondary | ICD-10-CM | POA: Insufficient documentation

## 2024-02-26 DIAGNOSIS — G8929 Other chronic pain: Secondary | ICD-10-CM | POA: Insufficient documentation

## 2024-02-26 DIAGNOSIS — M792 Neuralgia and neuritis, unspecified: Secondary | ICD-10-CM | POA: Diagnosis present

## 2024-02-26 MED ORDER — HYDROCODONE-ACETAMINOPHEN 10-325 MG PO TABS: 1.0000 | ORAL_TABLET | Freq: Three times a day (TID) | ORAL | 0 refills | Status: DC | PRN

## 2024-02-26 NOTE — Progress Notes (Signed)
 Nursing Pain Medication Assessment:  Safety precautions to be maintained throughout the outpatient stay will include: orient to surroundings, keep bed in low position, maintain call bell within reach at all times, provide assistance with transfer out of bed and ambulation.  Medication Inspection Compliance: Pill count conducted under aseptic conditions, in front of the patient. Neither the pills nor the bottle was removed from the patient's sight at any time. Once count was completed pills were immediately returned to the patient in their original bottle.  Medication: Hydrocodone /APAP Pill/Patch Count: 128 of 120 pills/patches remain Pill/Patch Appearance: Markings consistent with prescribed medication Bottle Appearance: Standard pharmacy container. Clearly labeled. Filled Date: 08 / 30 / 2025 Last Medication intake:  Yesterday

## 2024-02-26 NOTE — Progress Notes (Signed)
 PROVIDER NOTE: Interpretation of information contained herein should be left to medically-trained personnel. Specific patient instructions are provided elsewhere under Patient Instructions section of medical record. This document was created in part using AI and STT-dictation technology, any transcriptional errors that may result from this process are unintentional.  Patient: Perry Jones  Service: E/M   PCP: Glover Lenis, MD  DOB: Nov 07, 1950  DOS: 02/26/2024  Provider: Emmy MARLA Blanch, NP  MRN: [id number]  Delivery: Face-to-face  Specialty: Interventional Pain Management  Type: Established Patient  Setting: Ambulatory outpatient facility  Specialty designation: 09  Referring Prov.: Glover Lenis, MD  Location: Outpatient office facility       History of present illness (HPI) [name], a 73 y.o. year old male, is here today because of his Chronic pain syndrome [G89.4]. [name] primary complain today is Back Pain and Knee Pain  Pertinent problems: Perry Jones has  Chronic knee pain after total replacement of right knee joint; DDD (degenerative disc disease), lumbar; Essential hypertension; Chronic radicular lumbar pain; Back muscle spasm; History of total right knee replacement; History of lumbar fusion (L1-L3); Chronic bilateral low back pain with bilateral sciatica; Chronic pain syndrome; Lumbar facet arthropathy and Piriformis syndrome (Right) on their pertinent problem list.    Pain Assessment: Severity of Chronic pain is reported as a 5 /10. Location: Back Lower, Left/left worse than right today. Onset: More than a month ago. Quality: Other (Comment), Pins and needles, Constant. Timing: Constant. Modifying factor(s): Medication, Ice. Vitals:  height is 6' (1.829 m) and weight is 233 lb (105.7 kg). His temporal temperature is 97.2 F (36.2 C) (abnormal). His blood pressure is 174/85 (abnormal) and his pulse is 67. His respiration is 20 and oxygen saturation is 100%.  BMI:  Estimated body mass index is 31.6 kg/m as calculated from the following:   Height as of this encounter: 6' (1.829 m).   Weight as of this encounter: 233 lb (105.7 kg).  Last encounter: 12/04/2023. Last procedure: 01/28/2024  Reason for encounter: both, medication management and post-procedure evaluation and assessment. No change in medical history since last visit.  Patient's pain is at baseline.  Patient continues multimodal pain regimen as prescribed.  States that it provides pain relief and improvement in functional status.  Perry Jones underwent a diagnostic to ankle block of right saphenous, deep peroneal and superficial peroneal nerve on January 28, 2024.  He reports 90% pain relief and improvement with first 24 hours during local anesthetic phase; however after that the pain back to baseline.   The patient states that his knee pain has improved considerably after undergoing genicular nerve radiofrequency ablation (RFA).  He reports improved ambulation without discomfort and enhancement in functional capacity.  Procedure Procedure:           Anesthesia, Analgesia, Anxiolysis:  Type: Diagnostic True Ankle Block of the right saphenous, deep peroneal and superficial peroneal nerve with ultrasound guidance                Region: Dorsal             Level: Ankle Laterality: Right   Type: Local Anesthesia Local Anesthetic: Lidocaine  1-2% Sedation: None  Indication(s):  Analgesia Route: Infiltration (Sawyer/IM) IV Access: N/A     Position: Supine    1. Neuropathic pain of right ankle     NAS-11 Pain score:        Pre-procedure: 5 /10        Post-procedure: 0-No pain/10  Post-Procedure Evaluation    Effectiveness:  Initial hour after procedure: 90 % . Subsequent 4-6 hours post-procedure: 90 % . Analgesia past initial 6 hours: 0 % . Ongoing improvement:  Analgesic:  Perry Jones underwent a diagnostic to ankle block of right saphenous, deep peroneal and superficial peroneal nerve on January 28, 2024.  He reports 90% pain relief and improvement with first 24 hours during local anesthetic phase; however after that the pain back to baseline.  Function: Back to baseline ROM: Back to baseline   Pharmacotherapy Assessment   Analgesic: Hydrocodone -acetaminophen  (Norco) 10-325 mg tablet every 6 hours as needed for severe pain.[name] MME=40 Monitoring: [place] PMP: PDMP reviewed during this encounter.       Pharmacotherapy: No side-effects or adverse reactions reported. Compliance: No problems identified. Effectiveness: Clinically acceptable.  Erlene Doyal [place], [place]  02/26/2024  9:38 AM  Sign when Signing Visit Nursing Pain Medication Assessment:  Safety precautions to be maintained throughout the outpatient stay will include: orient to surroundings, keep bed in low position, maintain call bell within reach at all times, provide assistance with transfer out of bed and ambulation.  Medication Inspection Compliance: Pill count conducted under aseptic conditions, in front of the patient. Neither the pills nor the bottle was removed from the patient's sight at any time. Once count was completed pills were immediately returned to the patient in their original bottle.  Medication: Hydrocodone /APAP Pill/Patch Count: 128 of 120 pills/patches remain Pill/Patch Appearance: Markings consistent with prescribed medication Bottle Appearance: Standard pharmacy container. Clearly labeled. Filled Date: 08 / 30 / 2025 Last Medication intake:  Yesterday    UDS:  Summary  Date Value Ref Range Status  09/06/2023 FINAL  Final    Comment:    ==================================================================== [employer] [facility] (MW) ==================================================================== Test                             Result       Flag       Units  Drug Present and Declared for Prescription Verification   Lorazepam                      361          EXPECTED   ng/mg creat    Source of lorazepam  is a scheduled prescription medication.    Hydrocodone                     5352         EXPECTED   ng/mg creat   Hydromorphone                  759          EXPECTED   ng/mg creat   Dihydrocodeine                 292          EXPECTED   ng/mg creat   Norhydrocodone                 3597         EXPECTED   ng/mg creat    Sources of hydrocodone  include scheduled prescription medications.    Hydromorphone, dihydrocodeine and norhydrocodone are expected    metabolites of hydrocodone . Hydromorphone and dihydrocodeine are    also available as scheduled prescription medications.  ==================================================================== Test  Result    Flag   Units      Ref Range   Creatinine              106              mg/dL      >=79 ==================================================================== Declared Medications:  The flagging and interpretation on this report are based on the  following declared medications.  Unexpected results may arise from  inaccuracies in the declared medications.   **Note: The testing scope of this panel includes these medications:   Hydrocodone  (Norco)  Lorazepam (Ativan)   **Note: The testing scope of this panel does not include the  following reported medications:   Acetaminophen  (Norco)  Benazepril (Lotensin)  Biotin  Calcium  Cyclobenzaprine (Flexeril)  Docusate (Colace)  Ezetimibe (Zetia)  Hydrochlorothiazide  Ketoconazole  (Nizoral )  Loratadine  Multivitamin  Naproxen (Naprosyn)  Omeprazole (Prilosec)  Pregabalin (Lyrica)  Supplement  Tamsulosin (Flomax)  Topical  Topical Diclofenac (Voltaren)  Vitamin C  Zolpidem  (Ambien ) ==================================================================== For clinical consultation, please call [phone]. ====================================================================     No results found for: CBDTHCR No results found for: D8THCCBX No results found  for: D9THCCBX  ROS  Constitutional: Denies any fever or chills Gastrointestinal: No reported hemesis, hematochezia, vomiting, or acute GI distress Musculoskeletal: Denies any acute onset joint swelling, redness, loss of ROM, or weakness Neurological: No reported episodes of acute onset apraxia, aphasia, dysarthria, agnosia, amnesia, paralysis, loss of coordination, or loss of consciousness  Medication Review  Biotin, Calcium Polycarbophil, Echinacea, HYDROcodone -acetaminophen , LORazepam, Loratadine, Multi-Vitamins, Vitamin C, benazepril-hydrochlorthiazide, cyclobenzaprine, diclofenac Sodium, docusate sodium, ezetimibe, gabapentin, ketoconazole , naproxen, omeprazole, pregabalin, tacrolimus , tamsulosin, triamcinolone cream, and zolpidem   History Review  Allergy: Perry Jones is allergic to other, gluten meal, pine tar, pistachio nut (diagnostic), pistachio nut extract, prednisone, and walnut. Drug: Perry Jones  reports no history of drug use. Alcohol:  reports current alcohol use. Tobacco:  reports that he quit smoking about 39 years ago. His smoking use included cigarettes. He started smoking about 57 years ago. He has a 36 pack-year smoking history. He has never used smokeless tobacco. Social: Perry Jones  reports that he quit smoking about 39 years ago. His smoking use included cigarettes. He started smoking about 57 years ago. He has a 36 pack-year smoking history. He has never used smokeless tobacco. He reports current alcohol use. He reports that he does not use drugs. Medical:  has a past medical history of Actinic keratosis, Dysplastic nevus (03/11/2020), Dysplastic nevus (03/12/2019), High cholesterol, Hypertension, and Sinus trouble. Surgical: Perry Jones  has a past surgical history that includes Knee arthroscopy (Right); Colonoscopy with propofol  (N/A, 11/22/2017); Cholecystectomy; Lumbar fusion (N/A, 2000); Skin cancer excision; Replacement total knee (Right, 2007); Spinal cord stimulator  insertion (N/A, 02/21/2021); Spinal cord stimulator implant (02/21/2021); Colonoscopy with propofol  (N/A, 04/23/2023); and polypectomy (04/23/2023). Family: family history includes Dementia in his mother; Heart Problems in his mother; Lung cancer in his father.  Laboratory Chemistry Profile   Renal Lab Results  Component Value Date   BUN 12 02/10/2021   CREATININE 0.72 02/10/2021   GFRAA >60 07/14/2018   GFRNONAA >60 02/10/2021    Hepatic Lab Results  Component Value Date   AST 45 (H) 09/28/2020   ALT 37 09/28/2020   ALBUMIN 4.5 09/28/2020   ALKPHOS 44 09/28/2020   LIPASE 28 07/14/2018    Electrolytes Lab Results  Component Value Date   NA 139 02/10/2021   K 3.5 02/10/2021   CL 105 02/10/2021  CALCIUM 9.6 02/10/2021    Bone Lab Results  Component Value Date   TESTOSTERONE  45 (L) 09/28/2020    Inflammation (CRP: Acute Phase) (ESR: Chronic Phase) No results found for: CRP, ESRSEDRATE, LATICACIDVEN       Note: Above Lab results reviewed.  Recent Imaging Review  DG PAIN CLINIC C-ARM 1-60 MIN NO REPORT Fluoro was used, but no Radiologist interpretation will be provided.  Please refer to NOTES tab for provider progress note. Note: Reviewed        Physical Exam  Vitals: BP (!) 174/85 (BP Location: Right Arm, Patient Position: Sitting)   Pulse 67   Temp (!) 97.2 F (36.2 C) (Temporal)   Resp 20   Ht 6' (1.829 m)   Wt 233 lb (105.7 kg)   SpO2 100%   BMI 31.60 kg/m  BMI: Estimated body mass index is 31.6 kg/m as calculated from the following:   Height as of this encounter: 6' (1.829 m).   Weight as of this encounter: 233 lb (105.7 kg). Ideal: Ideal body weight: 77.6 kg (171 lb 1.2 oz) Adjusted ideal body weight: 88.8 kg (195 lb 13.5 oz) General appearance: Well nourished, well developed, and well hydrated. In no apparent acute distress Mental status: Alert, oriented x 3 (person, place, & time)       Respiratory: No evidence of acute respiratory  distress Eyes: PERLA   Assessment   Diagnosis Status  1. Chronic pain syndrome   2. Chronic radicular lumbar pain   3. Chronic use of opiate for therapeutic purpose   4. Neuropathic pain of right ankle   5. Pharmacologic therapy   6. Chronic knee pain after total replacement of right knee joint   7. Failed back surgical syndrome   8. Neuropathic pain of left ankle   9. History of total right knee replacement    Controlled Controlled Controlled   Updated Problems: No problems updated.  Plan of Care  Problem-specific:  Assessment and Plan We will continue on current medication regimen.  Prescribing drug monitoring (PDMP) reviewed; findings consistent with the use of prescribed medication and no evidence of narcotic misuse or abuse.  Urine drug screening (UDS) up-to-date.  No other new issues or problems reported at this visit.  Schedule follow-up in 90 days for medication management.   Perry Jones has a current medication list which includes the following long-term medication(s): benazepril-hydrochlorthiazide, loratadine, omeprazole, [START ON 03/24/2024] hydrocodone -acetaminophen , [START ON 04/23/2024] hydrocodone -acetaminophen , and [START ON 05/23/2024] hydrocodone -acetaminophen .  Pharmacotherapy (Medications Ordered): Meds ordered this encounter  Medications   HYDROcodone -acetaminophen  (NORCO) 10-325 MG tablet    Sig: Take 1 tablet by mouth every 8 (eight) hours as needed for severe pain (pain score 7-10). Must last 30 days.    Dispense:  90 tablet    Refill:  0    Chronic Pain: STOP Act (Not applicable) Fill 1 day early if closed on refill date. Avoid benzodiazepines within 8 hours of opioids   HYDROcodone -acetaminophen  (NORCO) 10-325 MG tablet    Sig: Take 1 tablet by mouth every 8 (eight) hours as needed for severe pain (pain score 7-10). Must last 30 days.    Dispense:  90 tablet    Refill:  0    Chronic Pain: STOP Act (Not applicable) Fill 1 day early if  closed on refill date. Avoid benzodiazepines within 8 hours of opioids   HYDROcodone -acetaminophen  (NORCO) 10-325 MG tablet    Sig: Take 1 tablet by mouth every 8 (eight) hours as needed  for severe pain (pain score 7-10). Must last 30 days.    Dispense:  90 tablet    Refill:  0    Chronic Pain: STOP Act (Not applicable) Fill 1 day early if closed on refill date. Avoid benzodiazepines within 8 hours of opioids   Orders:  No orders of the defined types were placed in this encounter.       Return in about 3 months (around 05/27/2024) for (F2F), (MM), Emmy Blanch NP.    Recent Visits Date Type Provider Dept  01/28/24 Procedure visit Marcelino Nurse, MD [facility]  12/31/23 Procedure visit Marcelino Nurse, MD [facility]  12/04/23 Office Visit Obadiah Dennard K, NP [facility]  Showing recent visits within past 90 days and meeting all other requirements Today's Visits Date Type Provider Dept  02/26/24 Office Visit Finis Hendricksen K, NP [facility]  Showing today's visits and meeting all other requirements Future Appointments No visits were found meeting these conditions. Showing future appointments within next 90 days and meeting all other requirements  I discussed the assessment and treatment plan with the patient. The patient was provided an opportunity to ask questions and all were answered. The patient agreed with the plan and demonstrated an understanding of the instructions.  Patient advised to call back or seek an in-person evaluation if the symptoms or condition worsens.  Duration of encounter: 30 minutes.  Total time on encounter, as per AMA guidelines included both the face-to-face and non-face-to-face time personally spent by the physician and/or other qualified health care professional(s) on the day of the encounter (includes time in activities that require the physician or other qualified health care professional and does not include time in  activities normally performed by clinical staff). Physician's time may include the following activities when performed: Preparing to see the patient (e.g., pre-charting review of records, searching for previously ordered imaging, lab work, and nerve conduction tests) Review of prior analgesic pharmacotherapies. Reviewing PMP Interpreting ordered tests (e.g., lab work, imaging, nerve conduction tests) Performing post-procedure evaluations, including interpretation of diagnostic procedures Obtaining and/or reviewing separately obtained history Performing a medically appropriate examination and/or evaluation Counseling and educating the patient/family/caregiver Ordering medications, tests, or procedures Referring and communicating with other health care professionals (when not separately reported) Documenting clinical information in the electronic or other health record Independently interpreting results (not separately reported) and communicating results to the patient/ family/caregiver Care coordination (not separately reported)  Note by: Kenyona Rena K Aquil Duhe, NP (TTS and AI technology used. I apologize for any typographical errors that were not detected and corrected.) Date: 02/26/2024; Time: 10:35 AM

## 2024-04-25 ENCOUNTER — Encounter: Payer: Self-pay | Admitting: Student in an Organized Health Care Education/Training Program

## 2024-04-25 DIAGNOSIS — G8929 Other chronic pain: Secondary | ICD-10-CM

## 2024-05-25 DIAGNOSIS — Z79899 Other long term (current) drug therapy: Secondary | ICD-10-CM | POA: Insufficient documentation

## 2024-05-25 DIAGNOSIS — Z79891 Long term (current) use of opiate analgesic: Secondary | ICD-10-CM | POA: Insufficient documentation

## 2024-05-27 ENCOUNTER — Ambulatory Visit: Attending: Nurse Practitioner | Admitting: Nurse Practitioner

## 2024-05-27 ENCOUNTER — Encounter: Payer: Self-pay | Admitting: Nurse Practitioner

## 2024-05-27 VITALS — BP 174/95 | HR 70 | Temp 97.3°F | Resp 20 | Ht 72.0 in | Wt 235.0 lb

## 2024-05-27 DIAGNOSIS — G8929 Other chronic pain: Secondary | ICD-10-CM | POA: Insufficient documentation

## 2024-05-27 DIAGNOSIS — Z79899 Other long term (current) drug therapy: Secondary | ICD-10-CM | POA: Diagnosis present

## 2024-05-27 DIAGNOSIS — Z79891 Long term (current) use of opiate analgesic: Secondary | ICD-10-CM | POA: Diagnosis present

## 2024-05-27 DIAGNOSIS — M961 Postlaminectomy syndrome, not elsewhere classified: Secondary | ICD-10-CM | POA: Insufficient documentation

## 2024-05-27 DIAGNOSIS — M25561 Pain in right knee: Secondary | ICD-10-CM | POA: Insufficient documentation

## 2024-05-27 DIAGNOSIS — M5416 Radiculopathy, lumbar region: Secondary | ICD-10-CM | POA: Diagnosis not present

## 2024-05-27 DIAGNOSIS — G894 Chronic pain syndrome: Secondary | ICD-10-CM | POA: Diagnosis not present

## 2024-05-27 DIAGNOSIS — Z96651 Presence of right artificial knee joint: Secondary | ICD-10-CM | POA: Diagnosis present

## 2024-05-27 MED ORDER — HYDROCODONE-ACETAMINOPHEN 10-325 MG PO TABS: 1.0000 | ORAL_TABLET | Freq: Four times a day (QID) | ORAL | 0 refills | Status: AC | PRN

## 2024-05-27 MED ORDER — NALOXONE HCL 4 MG/0.1ML NA LIQD: 1.0000 | NASAL | 1 refills | Status: AC | PRN

## 2024-05-27 NOTE — Progress Notes (Signed)
 PROVIDER NOTE: Interpretation of information contained herein should be left to medically-trained personnel. Specific patient instructions are provided elsewhere under Patient Instructions section of medical record. This document was created in part using AI and STT-dictation technology, any transcriptional errors that may result from this process are unintentional.  Patient: Perry Jones  Service: E/M   PCP: Glover Lenis, MD  DOB: Nov 23, 1950  DOS: 05/27/2024  Provider: Emmy MARLA Blanch, NP  MRN: [id number]  Delivery: Face-to-face  Specialty: Interventional Pain Management  Type: Established Patient  Setting: Ambulatory outpatient facility  Specialty designation: 09  Referring Prov.: Glover Lenis, MD  Location: Outpatient office facility       History of present illness (HPI) Perry Jones, a 73 y.o. 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 Perry Jones primary complain today is Back Pain and Knee Pain (Right/)  Pertinent problems: Perry Jones has Back muscle spasm; Chronic knee pain after total replacement of right knee joint; DDD (degenerative disc disease), lumbar; Chronic radicular lumbar pain; History of total right knee replacement; History of lumbar fusion (L1-L3); Chronic bilateral low back pain with bilateral sciatica; Chronic pain syndrome; Lumbar facet arthropathy; Piriformis syndrome, right; Failed back surgical syndrome; Self-inflicted laceration of wrist (HCC); Suicide attempt (HCC); Thoracic spine pain; OSA (obstructive sleep apnea); Neuropathic pain of left ankle; Osteoarthritis of left hip; Rotator cuff arthropathy, right; Rotator cuff tendinitis, right; Tingling; Traumatic complete tear of right rotator cuff; and Pain in both lower extremities on their pertinent problem list.  Pain Assessment: Severity of Chronic pain is reported as a 7 /10. Location: Knee Right/Denies. Onset: More  than a month ago. Quality: Crushing. Timing: Constant. Modifying factor(s): Ice. Vitals:  height is 6' (1.829 m) and weight is 235 lb (106.6 kg). His temporal temperature is 97.3 F (36.3 C) (abnormal). His blood pressure is 174/95 (abnormal) and his pulse is 70. His respiration is 20 and oxygen saturation is 97%.  BMI: Estimated body mass index is 31.87 kg/m as calculated from the following:   Height as of this encounter: 6' (1.829 m).   Weight as of this encounter: 235 lb (106.6 kg).  Last encounter: 02/26/2024. Last procedure: 01/28/2024  Reason for encounter: medication management. No change in medical history since last visit.  Patient's pain is at baseline.  Patient continues multimodal pain regimen as prescribed.  States that it provides pain relief and improvement in functional status.   Discussed the use of AI scribe software for clinical note transcription with the patient, who gave verbal consent to proceed.  History of Present Illness   Perry Jones is a 73 year old male who presents for pain management and follow-up on radiofrequency ablation.   He was unable to undergo a scheduled radiofrequency ablation on his right knee due to a scheduling conflict. The previous ablation in July provided significant relief for his knee pain.  Currently, he rates his right knee pain at 7 out of 10. He also experiences lower back pain, rated at 5 out of 10. He has not taken any pain medication this morning but has just taken one pill. He has been taking more hydrocodone  than prescribed due to increased pain, which has impacted his ability to walk his dog, a task now done by his wife.  He is currently taking hydrocodone  four times a day, having increased from a previous lower dose. He mentions cutting the pills in half  to manage his supply and has about two and a half days' worth of medication left. No side effects from Norco, stating he has been taking it for thirty years without issues  such as constipation.     Pharmacotherapy Assessment   Analgesic: Hydrocodone -acetaminophen  (Norco) 10-325 mg tablet every 6 hours as needed for severe pain.[name] MME=40 Monitoring: [place] PMP: PDMP reviewed during this encounter.       Pharmacotherapy: No side-effects or adverse reactions reported. Compliance: No problems identified. Effectiveness: Clinically acceptable.  Erlene Doyal [place]UN[place], [place]  05/27/2024  8:28 AM  Sign when Signing Visit Nursing Pain Medication Assessment:  Safety precautions to be maintained throughout the outpatient stay will include: orient to surroundings, keep bed in low position, maintain call bell within reach at all times, provide assistance with transfer out of bed and ambulation.  Medication Inspection Compliance: Pill count conducted under aseptic conditions, in front of the patient. Neither the pills nor the bottle was removed from the patient's sight at any time. Once count was completed pills were immediately returned to the patient in their original bottle.  Medication: Hydrocodone /APAP Pill/Patch Count: 9 of 90 pills/patches remain Pill/Patch Appearance: Markings consistent with prescribed medication Bottle Appearance: Standard pharmacy container. Clearly labeled. Filled Date: 24 / 05 / 2025 Last Medication intake:  Yesterday    UDS:  Summary  Date Value Ref Range Status  09/06/2023 FINAL  Final    Comment:    ==================================================================== [employer] (MW) ==================================================================== Test                             Result       Flag       Units  Drug Present and Declared for Prescription Verification   Lorazepam                      361          EXPECTED   ng/mg creat    Source of lorazepam is a scheduled prescription medication.    Hydrocodone                     5352         EXPECTED   ng/mg creat   Hydromorphone                  759          EXPECTED   ng/mg  creat   Dihydrocodeine                 292          EXPECTED   ng/mg creat   Norhydrocodone                 3597         EXPECTED   ng/mg creat    Sources of hydrocodone  include scheduled prescription medications.    Hydromorphone, dihydrocodeine and norhydrocodone are expected    metabolites of hydrocodone . Hydromorphone and dihydrocodeine are    also available as scheduled prescription medications.  ==================================================================== Test                      Result    Flag   Units      Ref Range   Creatinine              106  mg/dL      >=79 ==================================================================== Declared Medications:  The flagging and interpretation on this report are based on the  following declared medications.  Unexpected results may arise from  inaccuracies in the declared medications.   **Note: The testing scope of this panel includes these medications:   Hydrocodone  (Norco)  Lorazepam (Ativan)   **Note: The testing scope of this panel does not include the  following reported medications:   Acetaminophen  (Norco)  Benazepril (Lotensin)  Biotin  Calcium  Cyclobenzaprine (Flexeril)  Docusate (Colace)  Ezetimibe (Zetia)  Hydrochlorothiazide  Ketoconazole  (Nizoral )  Loratadine  Multivitamin  Naproxen (Naprosyn)  Omeprazole (Prilosec)  Pregabalin (Lyrica)  Supplement  Tamsulosin (Flomax)  Topical  Topical Diclofenac (Voltaren)  Vitamin C  Zolpidem  (Ambien ) ==================================================================== For clinical consultation, please call [phone]. ====================================================================     No results found for: CBDTHCR No results found for: D8THCCBX No results found for: D9THCCBX  ROS  Constitutional: Denies any fever or chills Gastrointestinal: No reported hemesis, hematochezia, vomiting, or acute GI distress Musculoskeletal: Right  knee pain, Low back pain Neurological: No reported episodes of acute onset apraxia, aphasia, dysarthria, agnosia, amnesia, paralysis, loss of coordination, or loss of consciousness  Medication Review  Biotin, Calcium Polycarbophil, Creatine Monohydrate, HYDROcodone -acetaminophen , LORazepam, Loratadine, Vitamin C, benazepril-hydrochlorthiazide, cyclobenzaprine, diclofenac Sodium, docusate sodium, ezetimibe, gabapentin, naloxone , omeprazole, pregabalin, tamsulosin, and zolpidem   History Review  Allergy: Mr. Prevo is allergic to other, gluten meal, pine tar, pistachio nut (diagnostic), pistachio nut extract, prednisone, and walnut. Drug: Mr. Trapp  reports no history of drug use. Alcohol:  reports current alcohol use. Tobacco:  reports that he quit smoking about 39 years ago. His smoking use included cigarettes. He started smoking about 57 years ago. He has a 36 pack-year smoking history. He has never used smokeless tobacco. Social: Mr. Coppolino  reports that he quit smoking about 39 years ago. His smoking use included cigarettes. He started smoking about 57 years ago. He has a 36 pack-year smoking history. He has never used smokeless tobacco. He reports current alcohol use. He reports that he does not use drugs. Medical:  has a past medical history of Actinic keratosis, Dysplastic nevus (03/11/2020), Dysplastic nevus (03/12/2019), High cholesterol, Hypertension, and Sinus trouble. Surgical: Mr. Gamino  has a past surgical history that includes Knee arthroscopy (Right); Colonoscopy with propofol  (N/A, 11/22/2017); Cholecystectomy; Lumbar fusion (N/A, 2000); Skin cancer excision; Replacement total knee (Right, 2007); Spinal cord stimulator insertion (N/A, 02/21/2021); Spinal cord stimulator implant (02/21/2021); Colonoscopy with propofol  (N/A, 04/23/2023); and polypectomy (04/23/2023). Family: family history includes Dementia in his mother; Heart Problems in his mother; Lung cancer in his father.  Laboratory  Chemistry Profile   Renal Lab Results  Component Value Date   BUN 12 02/10/2021   CREATININE 0.72 02/10/2021   GFRAA >60 07/14/2018   GFRNONAA >60 02/10/2021    Hepatic Lab Results  Component Value Date   AST 45 (H) 09/28/2020   ALT 37 09/28/2020   ALBUMIN 4.5 09/28/2020   ALKPHOS 44 09/28/2020   LIPASE 28 07/14/2018    Electrolytes Lab Results  Component Value Date   NA 139 02/10/2021   K 3.5 02/10/2021   CL 105 02/10/2021   CALCIUM 9.6 02/10/2021    Bone Lab Results  Component Value Date   TESTOSTERONE  45 (L) 09/28/2020    Inflammation (CRP: Acute Phase) (ESR: Chronic Phase) No results found for: CRP, ESRSEDRATE, LATICACIDVEN       Note: Above Lab results reviewed.  Recent Imaging Review  DG  PAIN CLINIC C-ARM 1-60 MIN NO REPORT Fluoro was used, but no Radiologist interpretation will be provided.  Please refer to NOTES tab for provider progress note. Note: Reviewed        Physical Exam  Vitals: BP (!) 174/95 (BP Location: Right Arm, Patient Position: Sitting, Cuff Size: Large)   Pulse 70   Temp (!) 97.3 F (36.3 C) (Temporal)   Resp 20   Ht 6' (1.829 m)   Wt 235 lb (106.6 kg)   SpO2 97%   BMI 31.87 kg/m  BMI: Estimated body mass index is 31.87 kg/m as calculated from the following:   Height as of this encounter: 6' (1.829 m).   Weight as of this encounter: 235 lb (106.6 kg). Ideal: Ideal body weight: 77.6 kg (171 lb 1.2 oz) Adjusted ideal body weight: 89.2 kg (196 lb 10.3 oz) General appearance: Well nourished, well developed, and well hydrated. In no apparent acute distress Mental status: Alert, oriented x 3 (person, place, & time)       Respiratory: No evidence of acute respiratory distress Eyes: PERLA  Musculoskeletal: +LBP Right knee pain worse with weightbearing, walking, and prolonged standing Assessment   Diagnosis Status  1. 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 Pharmacologic therapy   3. Chronic  radicular lumbar pain   4. Chronic pain syndrome   5. Chronic use of opiate for therapeutic purpose   6. Failed back surgical syndrome   7. History of total right knee replacement    Having a Flare-up Controlled Controlled   Updated Problems: No problems updated.  Plan of Care  Problem-specific:  Assessment and Plan    Chronic right knee pain after total knee replacement Chronic right knee pain persists with severity 7/10. Previous radiofrequency ablation provided significant relief. No adverse effects from Norco. - Increased hydrocodone  to every six hours for three months until radiofrequency ablation. - Scheduled radiofrequency ablation for right knee with Dr. Marcelino likely early January. - Ensured active order for hydrocodone  at pharmacy.  Chronic pain syndrome Lower back pain rated at 5/10. Increased hydrocodone  use due to pain severity. - Continue current pain management regimen with hydrocodone .  Patient's pain is controlled with hydrocodone , will continue on current medication regimen.  Prescribing drug monitoring (PDMP) reviewed, findings consistent with the use of prescribed medication and no evidence of narcotic misuse or abuse.  Urine drug screening (UDS) up to date.  The patient was advised to take medication as prescribed.  Schedule follow-up in 90 days for medication management.       Mr. Tytan Sandate has a current medication list which includes the following long-term medication(s): benazepril-hydrochlorthiazide, loratadine, omeprazole, [START ON 05/30/2024] hydrocodone -acetaminophen , [START ON 06/29/2024] hydrocodone -acetaminophen , and [START ON 07/29/2024] hydrocodone -acetaminophen .  Pharmacotherapy (Medications Ordered): Meds ordered this encounter  Medications   HYDROcodone -acetaminophen  (NORCO) 10-325 MG tablet    Sig: Take 1 tablet by mouth every 6 (six) hours as needed for severe pain (pain score 7-10). Must last 30 days.    Dispense:  120 tablet    Refill:   0    Chronic Pain: STOP Act (Not applicable) Fill 1 day early if closed on refill date. Avoid benzodiazepines within 8 hours of opioids   HYDROcodone -acetaminophen  (NORCO) 10-325 MG tablet    Sig: Take 1 tablet by mouth every 6 (six) hours as needed for severe pain (pain score 7-10). Must last 30 days.    Dispense:  120 tablet    Refill:  0  Chronic Pain: STOP Act (Not applicable) Fill 1 day early if closed on refill date. Avoid benzodiazepines within 8 hours of opioids   HYDROcodone -acetaminophen  (NORCO) 10-325 MG tablet    Sig: Take 1 tablet by mouth every 6 (six) hours as needed for severe pain (pain score 7-10). Must last 30 days.    Dispense:  120 tablet    Refill:  0    Chronic Pain: STOP Act (Not applicable) Fill 1 day early if closed on refill date. Avoid benzodiazepines within 8 hours of opioids   naloxone  (NARCAN ) nasal spray 4 mg/0.1 mL    Sig: Place 1 spray into the nose as needed for up to 365 doses (for opioid-induced respiratory depresssion). In case of emergency (overdose), spray once into each nostril. If no response within 3 minutes, repeat application and call 911.    Dispense:  1 each    Refill:  1    Instruct patient in proper use of device.   Orders:  No orders of the defined types were placed in this encounter.       Return in about 3 months (around 08/25/2024) for (F2F), (MM), Emmy Blanch NP.    Recent Visits No visits were found meeting these conditions. Showing recent visits within past 90 days and meeting all other requirements Today's Visits Date Type Provider Dept  05/27/24 Office Visit Cari Burgo K, NP [facility]  Showing today's visits and meeting all other requirements Future Appointments Date Type Provider Dept  08/21/24 Appointment Yaritzel Stange K, NP [facility]  Showing future appointments within next 90 days and meeting all other requirements  I discussed the assessment and treatment plan with the patient. The patient  was provided an opportunity to ask questions and all were answered. The patient agreed with the plan and demonstrated an understanding of the instructions.  Patient advised to call back or seek an in-person evaluation if the symptoms or condition worsens.  I personally spent a total of 30 minutes in the care of the patient today including preparing to see the patient, getting/reviewing separately obtained history, performing a medically appropriate exam/evaluation, counseling and educating, placing orders, referring and communicating with other health care professionals, documenting clinical information in the EHR, independently interpreting results, communicating results, and coordinating care.   Note by: Masiyah Engen K Khari Mally, NP (TTS and AI technology used. I apologize for any typographical errors that were not detected and corrected.) Date: 05/27/2024; Time: 9:40 AM

## 2024-05-27 NOTE — Progress Notes (Signed)
 Nursing Pain Medication Assessment:  Safety precautions to be maintained throughout the outpatient stay will include: orient to surroundings, keep bed in low position, maintain call bell within reach at all times, provide assistance with transfer out of bed and ambulation.  Medication Inspection Compliance: Pill count conducted under aseptic conditions, in front of the patient. Neither the pills nor the bottle was removed from the patient's sight at any time. Once count was completed pills were immediately returned to the patient in their original bottle.  Medication: Hydrocodone /APAP Pill/Patch Count: 9 of 90 pills/patches remain Pill/Patch Appearance: Markings consistent with prescribed medication Bottle Appearance: Standard pharmacy container. Clearly labeled. Filled Date: 59 / 05 / 2025 Last Medication intake:  Yesterday

## 2024-06-17 ENCOUNTER — Encounter: Payer: Self-pay | Admitting: Dermatology

## 2024-06-17 ENCOUNTER — Ambulatory Visit: Admitting: Dermatology

## 2024-06-17 DIAGNOSIS — L2089 Other atopic dermatitis: Secondary | ICD-10-CM

## 2024-06-17 DIAGNOSIS — Z7189 Other specified counseling: Secondary | ICD-10-CM

## 2024-06-17 DIAGNOSIS — Z79899 Other long term (current) drug therapy: Secondary | ICD-10-CM

## 2024-06-17 MED ORDER — EUCRISA 2 % EX OINT: TOPICAL_OINTMENT | CUTANEOUS | 2 refills | Status: DC

## 2024-06-17 NOTE — Patient Instructions (Signed)

## 2024-06-17 NOTE — Progress Notes (Signed)
" ° °  Follow-Up Visit   Subjective  Perry Jones is a 73 y.o. male who presents for the following: Atopic Dermatitis, he reports new areas of eczema in shoulders, waist, shins, and lt thigh. He is currently treating with Tacrolimus  but he feels that is not enough.  The following portions of the chart were reviewed this encounter and updated as appropriate: medications, allergies, medical history  Review of Systems:  No other skin or systemic complaints except as noted in HPI or Assessment and Plan.  Objective  Well appearing patient in no apparent distress; mood and affect are within normal limits.  Areas Examined: Face, right leg, left leg   Relevant physical exam findings are noted in the Assessment and Plan.    Assessment & Plan     ATOPIC DERMATITIS Exam: Scaly pink papules coalescing to plaques Right leg and left leg BSA 6% Chronic and persistent condition with duration or expected duration over one year. Condition is bothersome/symptomatic for patient. Currently flared. Possibly from colder weather? Atopic dermatitis (eczema) is a chronic, relapsing, pruritic condition that can significantly affect quality of life. It is often associated with allergic rhinitis and/or asthma and can require treatment with topical medications, phototherapy, or in severe cases biologic injectable medication (Dupixent; Adbry) or Oral JAK inhibitors.  Treatment Plan: Start Eucrisa  ointment apply to affected skin qd-bid   Recommend gentle skin care.   Return for scheduled appt 04/10/24.  IFay Kirks, CMA, am acting as scribe for Alm Rhyme, MD .   Documentation: I have reviewed the above documentation for accuracy and completeness, and I agree with the above.  Alm Rhyme, MD   "

## 2024-06-29 ENCOUNTER — Encounter: Payer: Self-pay | Admitting: Dermatology

## 2024-06-29 DIAGNOSIS — L209 Atopic dermatitis, unspecified: Secondary | ICD-10-CM

## 2024-06-30 MED ORDER — EUCRISA 2 % EX OINT: TOPICAL_OINTMENT | CUTANEOUS | 2 refills | Status: AC

## 2024-06-30 NOTE — Addendum Note (Signed)
 Addended by: TANDA SETTER A on: 06/30/2024 03:55 PM   Modules accepted: Orders

## 2024-07-02 ENCOUNTER — Ambulatory Visit: Admitting: Student in an Organized Health Care Education/Training Program

## 2024-07-07 ENCOUNTER — Encounter: Payer: Self-pay | Admitting: Student in an Organized Health Care Education/Training Program

## 2024-07-07 ENCOUNTER — Ambulatory Visit
Admission: RE | Admit: 2024-07-07 | Discharge: 2024-07-07 | Disposition: A | Discharge status: Home / Self Care | Source: Ambulatory Visit | Attending: Student in an Organized Health Care Education/Training Program | Admitting: Student in an Organized Health Care Education/Training Program

## 2024-07-07 ENCOUNTER — Ambulatory Visit: Admitting: Student in an Organized Health Care Education/Training Program

## 2024-07-07 VITALS — BP 127/80 | HR 65 | Temp 97.9°F | Resp 18 | Ht 72.0 in | Wt 240.0 lb

## 2024-07-07 DIAGNOSIS — Z96651 Presence of right artificial knee joint: Secondary | ICD-10-CM | POA: Diagnosis present

## 2024-07-07 DIAGNOSIS — M25561 Pain in right knee: Secondary | ICD-10-CM | POA: Diagnosis present

## 2024-07-07 DIAGNOSIS — G8929 Other chronic pain: Secondary | ICD-10-CM | POA: Insufficient documentation

## 2024-07-07 DIAGNOSIS — G894 Chronic pain syndrome: Secondary | ICD-10-CM

## 2024-07-07 MED ORDER — DEXAMETHASONE SOD PHOSPHATE PF 10 MG/ML IJ SOLN
10.0000 mg | Freq: Once | INTRAMUSCULAR | Status: AC
  Administered 2024-07-07: 10 mg

## 2024-07-07 MED ORDER — ROPIVACAINE HCL 2 MG/ML IJ SOLN
INTRAMUSCULAR | Status: AC
  Filled 2024-07-07: qty 20

## 2024-07-07 MED ORDER — DEXAMETHASONE SOD PHOSPHATE PF 10 MG/ML IJ SOLN
INTRAMUSCULAR | Status: AC
  Filled 2024-07-07: qty 1

## 2024-07-07 MED ORDER — ROPIVACAINE HCL 2 MG/ML IJ SOLN
9.0000 mL | Freq: Once | INTRAMUSCULAR | Status: AC
  Administered 2024-07-07: 9 mL via PERINEURAL

## 2024-07-07 MED ORDER — LIDOCAINE HCL 2 % IJ SOLN
20.0000 mL | Freq: Once | INTRAMUSCULAR | Status: AC
  Administered 2024-07-07: 100 mg

## 2024-07-07 MED ORDER — LIDOCAINE HCL (PF) 2 % IJ SOLN
INTRAMUSCULAR | Status: AC
  Filled 2024-07-07: qty 10

## 2024-07-07 NOTE — Progress Notes (Signed)
 Safety precautions to be maintained throughout the outpatient stay will include: orient to surroundings, keep bed in low position, maintain call bell within reach at all times, provide assistance with transfer out of bed and ambulation.

## 2024-07-07 NOTE — Patient Instructions (Signed)
 ______________________________________________________________________    Post-Radiofrequency (RF) Discharge Instructions  You have just completed a Radiofrequency Neurotomy.  The following instructions will provide you with information and guidelines for self-care upon discharge.  If at any time you have questions or concerns please call your physician. DO NOT DRIVE YOURSELF!!  Instructions: Apply ice: Fill a plastic sandwich bag with crushed ice. Cover it with a small towel and apply to injection site. Apply for 15 minutes then remove x 15 minutes. Repeat sequence on day of procedure, until you go to bed. The purpose is to minimize swelling and discomfort after procedure. Apply heat: Apply heat to procedure site starting the day following the procedure. The purpose is to treat any soreness and discomfort from the procedure. Food intake: No eating limitations, unless stipulated above.  Nevertheless, if you have had sedation, you may experience some nausea.  In this case, it may be wise to wait at least two hours prior to resuming regular diet. Physical activities: Keep activities to a minimum for the first 8 hours after the procedure. For the first 24 hours after the procedure, do not drive a motor vehicle,  Operate heavy machinery, power tools, or handle any weapons.  Consider walking with the use of an assistive device or accompanied by an adult for the first 24 hours.  Do not drink alcoholic beverages including beer.  Do not make any important decisions or sign any legal documents. Go home and rest today.  Resume activities tomorrow, as tolerated.  Use caution in moving about as you may experience mild leg weakness.  Use caution in cooking, use of household electrical appliances and climbing steps. Driving: If you have received any sedation, you are not allowed to drive for 24 hours after your procedure. Blood thinner: Restart your blood thinner 6 hours after your procedure. (Only for those taking  blood thinners) Insulin: As soon as you can eat, you may resume your normal dosing schedule. (Only for those taking insulin) Medications: May resume pre-procedure medications.  Do not take any drugs, other than what has been prescribed to you. Infection prevention: Keep procedure site clean and dry. Post-procedure Pain Diary: Extremely important that this be done correctly and accurately. Recorded information will be used to determine the next step in treatment. Pain evaluated is that of treated area only. Do not include pain from an untreated area. Complete every hour, on the hour, for the initial 8 hours. Set an alarm to help you do this part accurately. Do not go to sleep and have it completed later. It will not be accurate. Follow-up appointment: Keep your follow-up appointment after the procedure. Usually 2-6 weeks after radiofrequency. Bring you pain diary. The information collected will be essential for your long-term care.   Expect: From numbing medicine (AKA: Local Anesthetics): Numbness or decrease in pain. Onset: Full effect within 15 minutes of injected. Duration: It will depend on the type of local anesthetic used. On the average, 1 to 8 hours.  From steroids (when added): Decrease in swelling or inflammation. Once inflammation is improved, relief of the pain will follow. Onset of benefits: Depends on the amount of swelling present. The more swelling, the longer it will take for the benefits to be seen. In some cases, up to 10 days. Duration: Steroids will stay in the system x 2 weeks. Duration of benefits will depend on multiple posibilities including persistent irritating factors. From procedure: Some discomfort is to be expected once the numbing medicine wears off. In the case of  radiofrequency procedures, this may last as long as 6-8 weeks. Additional post-procedure pain medication is provided for this. Discomfort is minimized if ice and heat are applied as instructed.  Call  if: You experience numbness and weakness that gets worse with time, as opposed to wearing off. He experience any unusual bleeding, difficulty breathing, or loss of the ability to control your bowel and bladder. (This applies to Spinal procedures only) You experience any redness, swelling, heat, red streaks, elevated temperature, fever, or any other signs of a possible infection.  Emergency Numbers: Durning business hours (Monday - Thursday, 8:00 AM - 4:00 PM) (Friday, 9:00 AM - 12:00 Noon): [phone] After hours: [phone] ______________________________________________________________________     Radiofrequency Ablation Radiofrequency ablation is a procedure that is performed to relieve pain. The procedure is often used for back, neck, or arm pain. Radiofrequency ablation involves the use of a machine that creates radio waves to make heat. During the procedure, the heat is applied to the nerve that carries the pain signal. The heat damages the nerve and interferes with the pain signal. Pain relief usually starts about 2 weeks after the procedure and lasts for 6 months to 1 year. Tell a health care provider about: Any allergies you have. All medicines you are taking, including vitamins, herbs, eye drops, creams, and over-the-counter medicines. Any problems you or family members have had with anesthetic medicines. Any bleeding problems you have. Any surgeries you have had. Any medical conditions you have. Whether you are pregnant or may be pregnant. What are the risks? Generally, this is a safe procedure. However, problems may occur, including: Pain or soreness at the injection site. Allergic reaction to medicines given during the procedure. Bleeding. Infection at the injection site. Damage to nerves or blood vessels. What happens before the procedure? When to stop eating and drinking Follow instructions from your health care provider about what you may eat and drink before your  procedure. These may include: 8 hours before the procedure Stop eating most foods. Do not eat meat, fried foods, or fatty foods. Eat only light foods, such as toast or crackers. All liquids are okay except energy drinks and alcohol. 6 hours before the procedure Stop eating. Drink only clear liquids, such as water, clear fruit juice, black coffee, plain tea, and sports drinks. Do not drink energy drinks or alcohol. 2 hours before the procedure Stop drinking all liquids. You may be allowed to take medicine with small sips of water. If you do not follow your health care provider's instructions, your procedure may be delayed or canceled. Medicines Ask your health care provider about: Changing or stopping your regular medicines. This is especially important if you are taking diabetes medicines or blood thinners. Taking medicines such as aspirin and ibuprofen . These medicines can thin your blood. Do not take these medicines unless your health care provider tells you to take them. Taking over-the-counter medicines, vitamins, herbs, and supplements. General instructions Ask your health care provider what steps will be taken to help prevent infection. These steps may include: Removing hair at the procedure site. Washing skin with a germ-killing soap. Taking antibiotic medicine. If you will be going home right after the procedure, plan to have a responsible adult: Take you home from the hospital or clinic. You will not be allowed to drive. Care for you for the time you are told. What happens during the procedure?  You will be awake during the procedure. You will need to be able to talk with  the health care provider during the procedure. An IV will be inserted into one of your veins. You will be given one or more of the following: A medicine to help you relax (sedative). A medicine to numb the area (local anesthetic). Your health care provider will insert a radiofrequency needle into the area  to be treated. This is done with the help of fluoroscopy. A wire that carries the radio waves (electrode) will be put through the radiofrequency needle. An electrical pulse will be sent through the electrode to verify the correct nerve that is causing your pain. You will feel a tingling sensation, and you may have muscle twitching. The tissue around the needle tip will be heated by an electric current that comes from the radiofrequency machine. This will numb the nerves. The needle will be removed. A bandage (dressing) will be put on the insertion area. The procedure may vary among health care providers and hospitals. What happens after the procedure? Your blood pressure, heart rate, breathing rate, and blood oxygen level will be monitored until you leave the hospital or clinic. Return to your normal activities as told by your health care provider. Ask your health care provider what activities are safe for you. If you were given a sedative during the procedure, it can affect you for several hours. Do not drive or operate machinery until your health care provider says that it is safe. Summary Radiofrequency ablation is a procedure that is performed to relieve pain. The procedure is often used for back, neck, or arm pain. Radiofrequency ablation involves the use of a machine that creates radio waves to make heat. Plan to have a responsible adult take you home from the hospital or clinic. Do not drive or operate machinery until your health care provider says that it is safe. Return to your normal activities as told by your health care provider. Ask your health care provider what activities are safe for you. This information is not intended to replace advice given to you by your health care provider. Make sure you discuss any questions you have with your health care provider. Document Revised: 11/30/2020 Document Reviewed: 11/30/2020 [employer] Patient Education  2024 [employer].

## 2024-07-07 NOTE — Progress Notes (Signed)
 PROVIDER NOTE: Interpretation of information contained herein should be left to medically-trained personnel. Specific patient instructions are provided elsewhere under Patient Instructions section of medical record. This document was created in part using STT-dictation technology, any transcriptional errors that may result from this process are unintentional.  Patient: Perry Jones Type: Established DOB: 1951/05/09 MRN: [id number] PCP: Glover Lenis, MD  Service: Procedure DOS: 07/07/2024 Setting: Ambulatory Location: Ambulatory outpatient facility Delivery: Face-to-face Provider: Wallie Sherry, MD Specialty: Interventional Pain Management Specialty designation: 09 Location: Outpatient facility Ref. Prov.: Glover Lenis, MD       Interventional Therapy   Primary Reason for Visit: Interventional Pain Management Treatment. CC: Knee Pain (Right s/p joint replacement )    Procedure:          Anesthesia, Analgesia, Anxiolysis:  Type: Therapeutic Superolateral, Superomedial, and Inferomedial, Genicular Nerve Radiofrequency Ablation (destruction).   Region: Lateral, Anterior, and Medial aspects of the knee joint, above and below the knee joint proper. Level: Superior and inferior to the knee joint. Laterality: Right  Anesthesia: Local (1-2% Lidocaine )  Anxiolysis: None  Sedation: None  Guidance: Fluoroscopy           Position: Supine   Indications: 1. 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 History of total right knee replacement   3. Chronic pain syndrome    [name] has been dealing with the above chronic pain for longer than three months and has either failed to respond, was unable to tolerate, or simply did not get enough benefit from other more conservative therapies including, but not limited to: 1. Over-the-counter medications 2. Anti-inflammatory medications 3. Muscle relaxants 4. Membrane stabilizers 5. Opioids 6. Physical therapy  and/or chiropractic manipulation 7. Modalities (Heat, ice, etc.) 8. Invasive techniques such as nerve blocks. [name] has attained more than 50% relief of the pain from a series of diagnostic injections conducted in separate occasions.  Pain Score: Pre-procedure: 0-No pain/10 Post-procedure: 0-No pain/10    Pre-op H&P Assessment:  [name] is a 74 y.o. (year old), male patient, seen today for interventional treatment. He  has a past surgical history that includes Knee arthroscopy (Right); Colonoscopy with propofol  (N/A, 11/22/2017); Cholecystectomy; Lumbar fusion (N/A, 2000); Skin cancer excision; Replacement total knee (Right, 2007); Spinal cord stimulator insertion (N/A, 02/21/2021); Spinal cord stimulator implant (02/21/2021); Colonoscopy with propofol  (N/A, 04/23/2023); and polypectomy (04/23/2023). [name] has a current medication list which includes the following prescription(s): vitamin c, benazepril-hydrochlorthiazide, biotin, calcium polycarbophil, creatine monohydrate, eucrisa , diclofenac sodium, docusate sodium, ezetimibe, gabapentin, hydrocodone -acetaminophen , hydrocodone -acetaminophen , [START ON 07/29/2024] hydrocodone -acetaminophen , loratadine, lorazepam, naloxone , omeprazole, pregabalin, tamsulosin, zolpidem , and cyclobenzaprine. His primarily concern today is the Knee Pain (Right s/p joint replacement )  Initial Vital Signs:  Pulse/HCG Rate: 65ECG Heart Rate: 63 Temp: 97.9 F (36.6 C) Resp: 16 BP: 116/87 SpO2: 96 %  BMI: Estimated body mass index is 32.55 kg/m as calculated from the following:   Height as of this encounter: 6' (1.829 m).   Weight as of this encounter: 240 lb (108.9 kg).  Risk Assessment: Allergies: Reviewed. He is allergic to other, gluten meal, pine tar, pistachio nut (diagnostic), pistachio nut extract, prednisone, and walnut.  Allergy Precautions: None required Coagulopathies: Reviewed. None identified.  Blood-thinner therapy: None at this  time Active Infection(s): Reviewed. None identified. [name] is afebrile  Site Confirmation: Mr. Flanery was asked to confirm the procedure and laterality before marking the site Procedure checklist: Completed Consent: Before the procedure and under the influence  of no sedative(s), amnesic(s), or anxiolytics, the patient was informed of the treatment options, risks and possible complications. To fulfill our ethical and legal obligations, as recommended by the American Medical Association's Code of Ethics, I have informed the patient of my clinical impression; the nature and purpose of the treatment or procedure; the risks, benefits, and possible complications of the intervention; the alternatives, including doing nothing; the risk(s) and benefit(s) of the alternative treatment(s) or procedure(s); and the risk(s) and benefit(s) of doing nothing. The patient was provided information about the general risks and possible complications associated with the procedure. These may include, but are not limited to: failure to achieve desired goals, infection, bleeding, organ or nerve damage, allergic reactions, paralysis, and death. In addition, the patient was informed of those risks and complications associated to the procedure, such as failure to decrease pain; infection; bleeding; organ or nerve damage with subsequent damage to sensory, motor, and/or autonomic systems, resulting in permanent pain, numbness, and/or weakness of one or several areas of the body; allergic reactions; (i.e.: anaphylactic reaction); and/or death. Furthermore, the patient was informed of those risks and complications associated with the medications. These include, but are not limited to: allergic reactions (i.e.: anaphylactic or anaphylactoid reaction(s)); adrenal axis suppression; blood sugar elevation that in diabetics may result in ketoacidosis or comma; water retention that in patients with history of congestive heart failure may result  in shortness of breath, pulmonary edema, and decompensation with resultant heart failure; weight gain; swelling or edema; medication-induced neural toxicity; particulate matter embolism and blood vessel occlusion with resultant organ, and/or nervous system infarction; and/or aseptic necrosis of one or more joints. Finally, the patient was informed that Medicine is not an exact science; therefore, there is also the possibility of unforeseen or unpredictable risks and/or possible complications that may result in a catastrophic outcome. The patient indicated having understood very clearly. We have given the patient no guarantees and we have made no promises. Enough time was given to the patient to ask questions, all of which were answered to the patient's satisfaction. Mr. Petz has indicated that he wanted to continue with the procedure. Attestation: I, the ordering provider, attest that I have discussed with the patient the benefits, risks, side-effects, alternatives, likelihood of achieving goals, and potential problems during recovery for the procedure that I have provided informed consent. Date  Time: 07/07/2024 11:13 AM  Pre-Procedure Preparation:  Monitoring: As per clinic protocol. Respiration, ETCO2, SpO2, BP, heart rate and rhythm monitor placed and checked for adequate function Safety Precautions: Patient was assessed for positional comfort and pressure points before starting the procedure. Time-out: I initiated and conducted the Time-out before starting the procedure, as per protocol. The patient was asked to participate by confirming the accuracy of the Time Out information. Verification of the correct person, site, and procedure were performed and confirmed by me, the nursing staff, and the patient. Time-out conducted as per Joint Commission's Universal Protocol (UP.01.01.01). Time: 1211 Start Time: 1211 hrs.  Description of Procedure:          Target Area: For Genicular Nerve  radiofrequency ablation (destruction), the targets are: the superolateral genicular nerve, located in the lateral distal portion of the femoral shaft as it curves to form the lateral epicondyle, in the region of the distal femoral metaphysis; the superomedial genicular nerve, located in the medial distal portion of the femoral shaft as it curves to form the medial epicondyle; and the inferomedial genicular nerve, located in the medial, proximal portion of  the tibial shaft, as it curves to form the medial epicondyle, in the region of the proximal tibial metaphysis. Approach: Anterior, ipsilateral approach. Area Prepped: Entire knee area, from mid-thigh to mid-shin, lateral, anterior, and medial aspects. DuraPrep (Iodine Povacrylex [0.7% available iodine] and Isopropyl Alcohol, 74% w/w) Safety Precautions: Aspiration looking for blood return was conducted prior to all injections. At no point did we inject any substances, as a needle was being advanced. No attempts were made at seeking any paresthesias. Safe injection practices and needle disposal techniques used. Medications properly checked for expiration dates. SDV (single dose vial) medications used. Description of the Procedure: Protocol guidelines were followed. The patient was placed in position over the procedure table. The target area was identified and the area prepped in the usual manner. The skin and muscle were infiltrated with local anesthetic. Appropriate amount of time allowed to pass for local anesthetics to take effect. Radiofrequency needles were introduced to the target area using fluoroscopic guidance. Using the NeuroTherm NT1100 Radiofrequency Generator, sensory stimulation using 50 Hz was used to locate & identify the nerve, making sure that the needle was positioned such that there was no sensory stimulation below 0.3 V or above 0.7 V. Stimulation using 2 Hz was used to evaluate the motor component. Care was taken not to lesion any nerves  that demonstrated motor stimulation of the lower extremities at an output of less than 2.5 times that of the sensory threshold, or a maximum of 2.0 V. Once satisfactory placement of the needles was achieved, the numbing solution was slowly injected after negative aspiration. After waiting for at least 2 minutes, the ablation was performed at 80 degrees C for 60 seconds, using regular Radiofrequency settings. Once the procedure was completed, the needles were then removed and the area cleansed, making sure to leave some of the prepping solution back to take advantage of its long term bactericidal properties. Intra-operative Compliance: Compliant      Vitals:   07/07/24 1211 07/07/24 1216 07/07/24 1221 07/07/24 1227  BP: (!) 105/59 139/79 (!) 140/72 127/80  Pulse:      Resp: 18 15 15 18   Temp:      TempSrc:      SpO2: 96% 95% 96% 97%  Weight:      Height:        Start Time: 1211 hrs. End Time: 1220 hrs. Materials & Medications:  Needle(s) Type: Teflon-coated, curved tip, Radiofrequency needle(s) Gauge: 22G Length: 10cm Medication(s): Please see orders for medications and dosing details. 6 cc solution made of 5 cc of 0.2% ropivacaine , 1 cc of Decadron  10 mg/cc.  2 cc injected at each level above for the right genicular nerves after sensorimotor testing, prior to lesioning. Imaging Guidance (Non-Spinal):          Type of Imaging Technique: Fluoroscopy Guidance (Non-Spinal) Indication(s): Assistance in needle guidance and placement for procedures requiring needle placement in or near specific anatomical locations not easily accessible without such assistance. Exposure Time: Please see nurses notes. Contrast: None used. Fluoroscopic Guidance: I was personally present during the use of fluoroscopy. Tunnel Vision Technique used to obtain the best possible view of the target area. Parallax error corrected before commencing the procedure. Direction-depth-direction technique used to introduce  the needle under continuous pulsed fluoroscopy. Once target was reached, antero-posterior, oblique, and lateral fluoroscopic projection used confirm needle placement in all planes. Images permanently stored in EMR. Interpretation: No contrast injected.  Antibiotic Prophylaxis:   Anti-infectives (From admission, onward)    None  Indication(s): None identified  Post-operative Assessment:  Post-procedure Vital Signs:  Pulse/HCG Rate: 65(!) 59 Temp: 97.9 F (36.6 C) Resp: 18 BP: 127/80 SpO2: 97 %  EBL: None  Complications: No immediate post-treatment complications observed by team, or reported by patient.  Note: The patient tolerated the entire procedure well. A repeat set of vitals were taken after the procedure and the patient was kept under observation following institutional policy, for this type of procedure. Post-procedural neurological assessment was performed, showing return to baseline, prior to discharge. The patient was provided with post-procedure discharge instructions, including a section on how to identify potential problems. Should any problems arise concerning this procedure, the patient was given instructions to immediately contact us , at any time, without hesitation. In any case, we plan to contact the patient by telephone for a follow-up status report regarding this interventional procedure.  Comments:  No additional relevant information.  Plan of Care (POC)   Orders:  Orders Placed This Encounter  Procedures   DG PAIN CLIN[facility] C-ARM 1-60 MIN NO REPORT    Intraoperative interpretation by procedural physician at [facility].    Standing Status:   Standing    Number of Occurrences:   1    Reason for exam::   Assistance in needle guidance and placement for procedures requiring needle placement in or near specific anatomical locations not easily accessible without such assistance.     Medications ordered for procedure: Meds ordered this encounter   Medications   lidocaine  (XYLOCAINE ) 2 % (with pres) injection 400 mg   ropivacaine  (PF) 2 mg/mL (0.2%) (NAROPIN ) injection 9 mL   dexamethasone  (DECADRON ) injection 10 mg   Medications administered: We administered lidocaine , ropivacaine  (PF) 2 mg/mL (0.2%), and dexamethasone .  See the medical record for exact dosing, route, and time of administration.  Follow-up plan:   Return for Keep sch. appt with Seema 08/21/24.      Recent Visits Date Type Provider Dept  05/27/24 Office Visit Patel, Seema K, NP [facility]  Showing recent visits within past 90 days and meeting all other requirements Today's Visits Date Type Provider Dept  07/07/24 Procedure visit Marcelino Nurse, MD [facility]  Showing today's visits and meeting all other requirements Future Appointments Date Type Provider Dept  08/12/24 Appointment Marcelino Nurse, MD [facility]  08/21/24 Appointment Patel, Seema K, NP [facility]  Showing future appointments within next 90 days and meeting all other requirements  Disposition: Discharge home  Discharge (Date  Time): 07/07/2024; 1235 hrs.   Primary Care Physician: Glover Lenis, MD Location: [facility] Note by: Nurse Marcelino, MD (TTS technology used. I apologize for any typographical errors that were not detected and corrected.) Date: 07/07/2024; Time: 12:52 PM  Disclaimer:  Medicine is not an visual merchandiser. The only guarantee in medicine is that nothing is guaranteed. It is important to note that the decision to proceed with this intervention was based on the information collected from the patient. The Data and conclusions were drawn from the patient's questionnaire, the interview, and the physical examination. Because the information was provided in large part by the patient, it cannot be guaranteed that it has not been purposely or unconsciously manipulated. Every effort has been made to obtain as much  relevant data as possible for this evaluation. It is important to note that the conclusions that lead to this procedure are derived in large part from the available data. Always take into account that the treatment will  also be dependent on availability of resources and existing treatment guidelines, considered by other Pain Management Practitioners as being common knowledge and practice, at the time of the intervention. For Medico-Legal purposes, it is also important to point out that variation in procedural techniques and pharmacological choices are the acceptable norm. The indications, contraindications, technique, and results of the above procedure should only be interpreted and judged by a Board-Certified Interventional Pain Specialist with extensive familiarity and expertise in the same exact procedure and technique.

## 2024-07-08 ENCOUNTER — Telehealth: Payer: Self-pay

## 2024-07-08 NOTE — Telephone Encounter (Signed)
 Post procedure follow up.  Patient states he is doing good.

## 2024-07-22 ENCOUNTER — Telehealth: Admitting: Student in an Organized Health Care Education/Training Program

## 2024-08-12 ENCOUNTER — Ambulatory Visit: Admitting: Student in an Organized Health Care Education/Training Program

## 2024-08-21 ENCOUNTER — Encounter: Admitting: Nurse Practitioner

## 2025-04-09 ENCOUNTER — Ambulatory Visit: Payer: Medicare HMO | Admitting: Dermatology
